# Patient Record
Sex: Female | Born: 1935
Health system: Southern US, Community
[De-identification: ages and names within clinical notes are randomized; demographics above are authoritative.]

## PROBLEM LIST (undated history)

## (undated) DIAGNOSIS — C4490 Unspecified malignant neoplasm of skin, unspecified: Secondary | ICD-10-CM

## (undated) DIAGNOSIS — K222 Esophageal obstruction: Secondary | ICD-10-CM

## (undated) DIAGNOSIS — R55 Syncope and collapse: Secondary | ICD-10-CM

## (undated) DIAGNOSIS — K219 Gastro-esophageal reflux disease without esophagitis: Secondary | ICD-10-CM

## (undated) DIAGNOSIS — R000 Tachycardia, unspecified: Secondary | ICD-10-CM

## (undated) DIAGNOSIS — R001 Bradycardia, unspecified: Secondary | ICD-10-CM

## (undated) HISTORY — DX: Bradycardia, unspecified: R00.1

## (undated) HISTORY — DX: Gastro-esophageal reflux disease without esophagitis: K21.9

## (undated) HISTORY — DX: Unspecified malignant neoplasm of skin, unspecified: C44.90

## (undated) HISTORY — DX: Syncope and collapse: R55

## (undated) HISTORY — DX: Tachycardia, unspecified: R00.0

## (undated) HISTORY — PX: TONSILLECTOMY AND ADENOIDECTOMY: SUR1326

## (undated) HISTORY — DX: Esophageal obstruction: K22.2

## (undated) HISTORY — PX: SKIN CANCER EXCISION: SHX779

---

## 2004-08-13 ENCOUNTER — Ambulatory Visit: Payer: Self-pay | Admitting: Internal Medicine

## 2004-08-15 ENCOUNTER — Ambulatory Visit: Payer: Self-pay | Admitting: Internal Medicine

## 2004-08-15 ENCOUNTER — Ambulatory Visit: Payer: Self-pay | Admitting: Family Medicine

## 2004-09-09 ENCOUNTER — Ambulatory Visit: Payer: Self-pay | Admitting: Internal Medicine

## 2004-09-17 ENCOUNTER — Ambulatory Visit: Payer: Self-pay | Admitting: Gastroenterology

## 2004-10-01 ENCOUNTER — Encounter: Admission: RE | Admit: 2004-10-01 | Discharge: 2004-10-01 | Payer: Self-pay | Admitting: Internal Medicine

## 2004-10-03 ENCOUNTER — Encounter (INDEPENDENT_AMBULATORY_CARE_PROVIDER_SITE_OTHER): Payer: Self-pay | Admitting: Specialist

## 2004-10-03 ENCOUNTER — Ambulatory Visit: Payer: Self-pay | Admitting: Gastroenterology

## 2004-10-03 ENCOUNTER — Ambulatory Visit (HOSPITAL_COMMUNITY): Admission: RE | Admit: 2004-10-03 | Discharge: 2004-10-03 | Payer: Self-pay | Admitting: Gastroenterology

## 2004-10-15 ENCOUNTER — Ambulatory Visit: Payer: Self-pay | Admitting: Internal Medicine

## 2004-10-21 ENCOUNTER — Ambulatory Visit: Payer: Self-pay

## 2006-05-12 HISTORY — PX: PACEMAKER PLACEMENT: SHX43

## 2006-06-17 ENCOUNTER — Ambulatory Visit: Payer: Self-pay | Admitting: Internal Medicine

## 2006-06-17 LAB — CONVERTED CEMR LAB
CO2: 30 meq/L (ref 19–32)
GFR calc Af Amer: 79 mL/min
GFR calc non Af Amer: 65 mL/min
Glucose, Bld: 108 mg/dL — ABNORMAL HIGH (ref 70–99)
Potassium: 4.8 meq/L (ref 3.5–5.1)
T3, Free: 3.4 pg/mL (ref 2.3–4.2)

## 2006-06-23 ENCOUNTER — Encounter: Payer: Self-pay | Admitting: Cardiology

## 2006-06-23 ENCOUNTER — Ambulatory Visit: Payer: Self-pay

## 2006-06-25 ENCOUNTER — Ambulatory Visit: Payer: Self-pay | Admitting: Cardiology

## 2006-07-01 ENCOUNTER — Ambulatory Visit: Payer: Self-pay

## 2006-07-02 ENCOUNTER — Ambulatory Visit: Payer: Self-pay | Admitting: Cardiology

## 2006-08-06 ENCOUNTER — Ambulatory Visit: Payer: Self-pay | Admitting: Internal Medicine

## 2006-08-06 LAB — CONVERTED CEMR LAB
BUN: 16 mg/dL (ref 6–23)
Basophils Absolute: 0 10*3/uL (ref 0.0–0.1)
Calcium: 9.5 mg/dL (ref 8.4–10.5)
Chloride: 103 meq/L (ref 96–112)
Creatinine, Ser: 0.8 mg/dL (ref 0.4–1.2)
Eosinophils Absolute: 0.1 10*3/uL (ref 0.0–0.6)
Hemoglobin: 16.1 g/dL — ABNORMAL HIGH (ref 12.0–15.0)
Lymphocytes Relative: 17.9 % (ref 12.0–46.0)
MCHC: 34.2 g/dL (ref 30.0–36.0)
MCV: 95.2 fL (ref 78.0–100.0)
Monocytes Absolute: 0.7 10*3/uL (ref 0.2–0.7)
Monocytes Relative: 10.4 % (ref 3.0–11.0)
Neutro Abs: 4.6 10*3/uL (ref 1.4–7.7)
Prothrombin Time: 11.8 s (ref 10.0–14.0)
RDW: 12.1 % (ref 11.5–14.6)
Sodium: 141 meq/L (ref 135–145)
WBC: 6.6 10*3/uL (ref 4.5–10.5)
aPTT: 29.6 s (ref 26.5–36.5)

## 2006-08-07 ENCOUNTER — Ambulatory Visit (HOSPITAL_COMMUNITY): Admission: RE | Admit: 2006-08-07 | Discharge: 2006-08-07 | Payer: Self-pay | Admitting: Internal Medicine

## 2006-08-07 ENCOUNTER — Ambulatory Visit: Payer: Self-pay | Admitting: Internal Medicine

## 2006-08-11 ENCOUNTER — Ambulatory Visit: Payer: Self-pay | Admitting: Internal Medicine

## 2006-08-27 ENCOUNTER — Ambulatory Visit: Payer: Self-pay

## 2006-08-31 ENCOUNTER — Ambulatory Visit: Payer: Self-pay | Admitting: Cardiology

## 2006-11-10 ENCOUNTER — Ambulatory Visit: Payer: Self-pay | Admitting: Internal Medicine

## 2006-11-10 LAB — CONVERTED CEMR LAB
BUN: 12 mg/dL (ref 6–23)
Basophils Absolute: 0 10*3/uL (ref 0.0–0.1)
Creatinine, Ser: 0.8 mg/dL (ref 0.4–1.2)
Eosinophils Absolute: 0.2 10*3/uL (ref 0.0–0.6)
Eosinophils Relative: 2.8 % (ref 0.0–5.0)
GFR calc Af Amer: 90 mL/min
GFR calc non Af Amer: 75 mL/min
HCT: 46.5 % — ABNORMAL HIGH (ref 36.0–46.0)
Hemoglobin: 15.5 g/dL — ABNORMAL HIGH (ref 12.0–15.0)
Lymphocytes Relative: 17.9 % (ref 12.0–46.0)
MCV: 98.1 fL (ref 78.0–100.0)
Monocytes Absolute: 0.6 10*3/uL (ref 0.2–0.7)
Neutrophils Relative %: 68.4 % (ref 43.0–77.0)
Potassium: 5 meq/L (ref 3.5–5.1)
Prothrombin Time: 10.6 s (ref 10.0–14.0)
Sodium: 142 meq/L (ref 135–145)
WBC: 5.7 10*3/uL (ref 4.5–10.5)

## 2006-11-12 ENCOUNTER — Ambulatory Visit (HOSPITAL_COMMUNITY): Admission: RE | Admit: 2006-11-12 | Discharge: 2006-11-14 | Payer: Self-pay | Admitting: Internal Medicine

## 2006-11-12 ENCOUNTER — Ambulatory Visit: Payer: Self-pay | Admitting: Pulmonary Disease

## 2006-11-12 ENCOUNTER — Ambulatory Visit: Payer: Self-pay | Admitting: Internal Medicine

## 2006-11-20 ENCOUNTER — Ambulatory Visit: Payer: Self-pay | Admitting: Internal Medicine

## 2006-11-30 ENCOUNTER — Ambulatory Visit: Payer: Self-pay

## 2006-12-18 ENCOUNTER — Ambulatory Visit: Payer: Self-pay | Admitting: Cardiology

## 2007-03-09 ENCOUNTER — Ambulatory Visit: Payer: Self-pay | Admitting: Internal Medicine

## 2007-10-22 IMAGING — CR DG CHEST 2V
3 series · 3 of 3 positions shown · non-contrast
Comparison: 11/14/06.

11/23/06 ? This exam was performed on 11/20/06 (though the exam date which appears on this exam order states 11/23/06)
CLINICAL DATA: 76-year-old with pacemaker placed.  Follow-up pneumothorax.
 CHEST - 2 VIEW:

[view not recorded (1 of 3)]
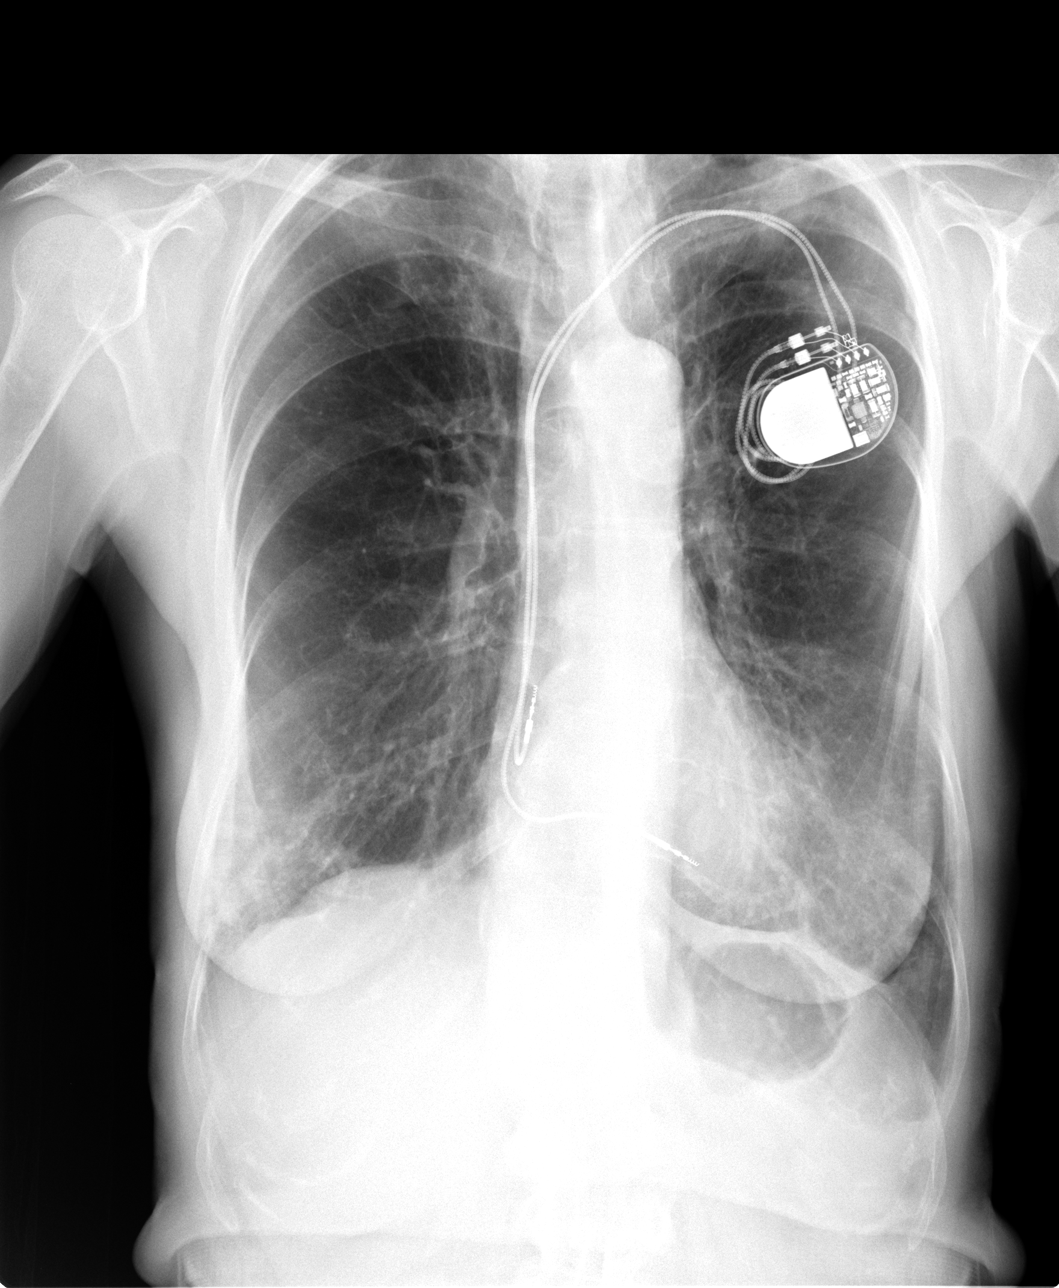

[view not recorded (2 of 3)]
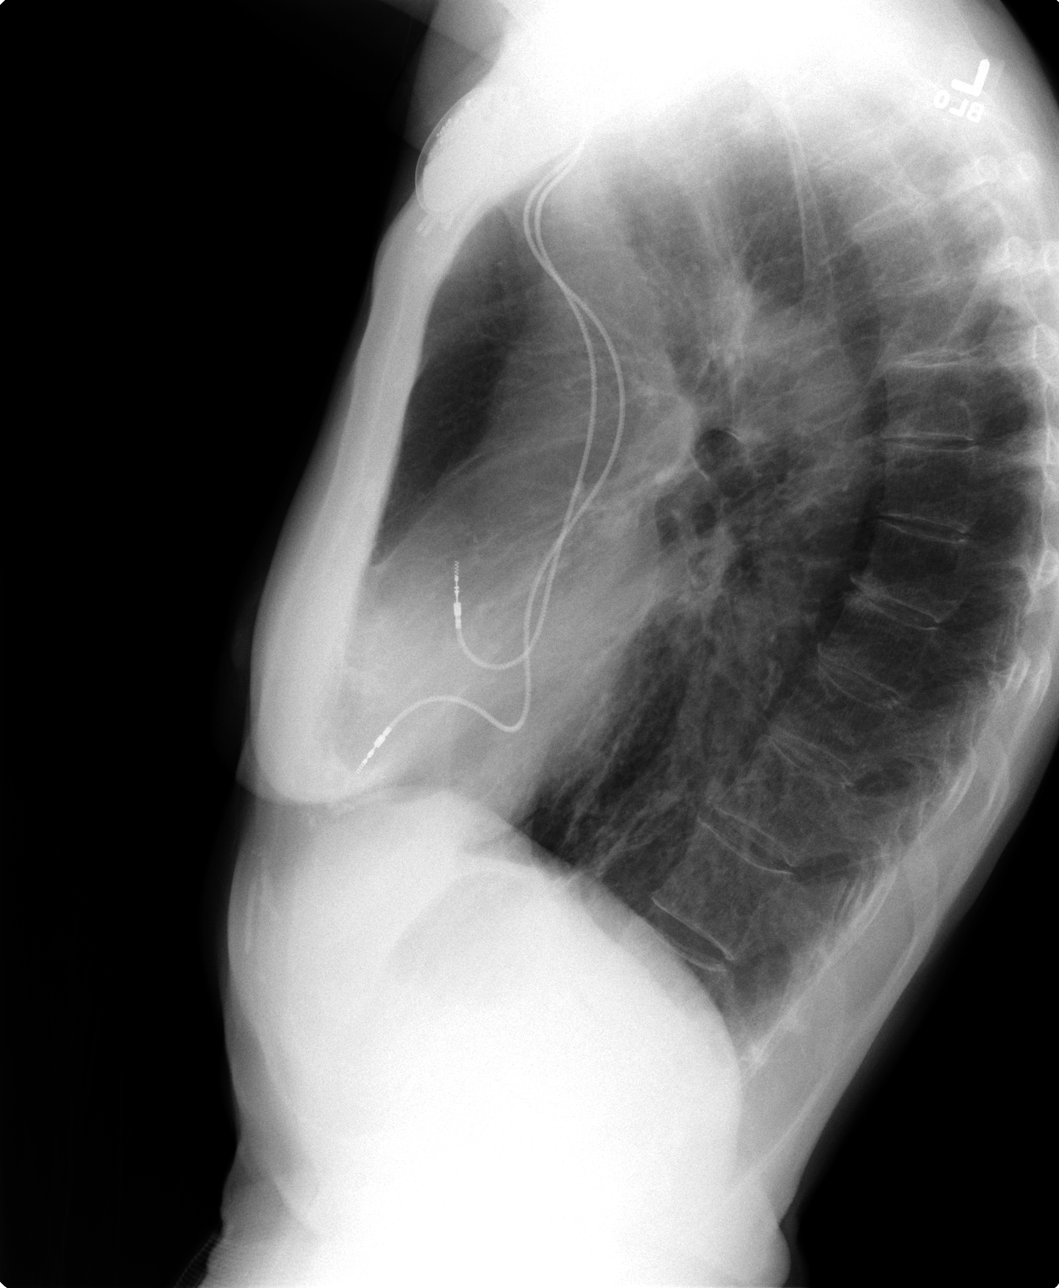

[view not recorded (3 of 3)]
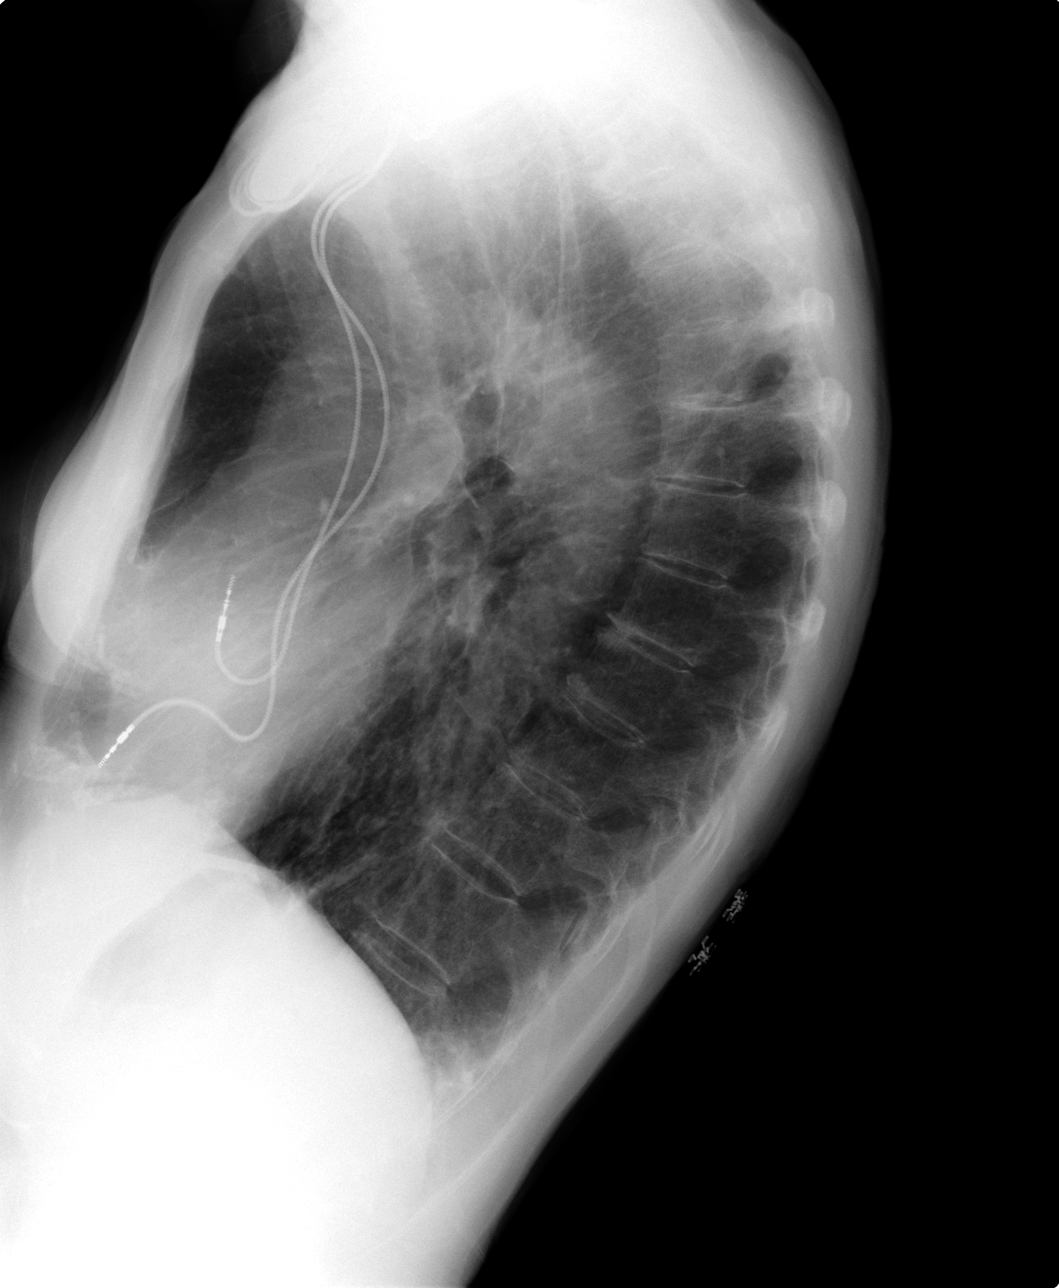

[3 of 3 positions shown; findings below may reference images not displayed]

FINDINGS: Patient has left-sided pacemaker with leads to the right atrium and right ventricle.  The lungs are hyperinflated.  Tiny left apical pneumothorax is slightly smaller.  There is patchy density at the right lung base, possibly increased since prior study.
IMPRESSION: 1.  COPD and emphysema.
 2.  Small left apical pneumothorax appears slightly smaller.
 3.  Question of increasing right lower lobe infiltrate.

## 2007-12-06 ENCOUNTER — Ambulatory Visit: Payer: Self-pay

## 2008-02-28 ENCOUNTER — Encounter: Payer: Self-pay | Admitting: Internal Medicine

## 2008-06-02 ENCOUNTER — Ambulatory Visit: Payer: Self-pay | Admitting: Internal Medicine

## 2008-06-02 DIAGNOSIS — K222 Esophageal obstruction: Secondary | ICD-10-CM | POA: Insufficient documentation

## 2008-06-02 DIAGNOSIS — I495 Sick sinus syndrome: Secondary | ICD-10-CM | POA: Insufficient documentation

## 2008-06-02 DIAGNOSIS — R55 Syncope and collapse: Secondary | ICD-10-CM | POA: Insufficient documentation

## 2008-08-30 ENCOUNTER — Ambulatory Visit: Payer: Self-pay | Admitting: Internal Medicine

## 2008-08-31 ENCOUNTER — Encounter (INDEPENDENT_AMBULATORY_CARE_PROVIDER_SITE_OTHER): Payer: Self-pay | Admitting: *Deleted

## 2008-09-07 ENCOUNTER — Encounter (INDEPENDENT_AMBULATORY_CARE_PROVIDER_SITE_OTHER): Payer: Self-pay | Admitting: *Deleted

## 2008-11-29 ENCOUNTER — Encounter: Payer: Self-pay | Admitting: Internal Medicine

## 2008-11-29 ENCOUNTER — Ambulatory Visit: Payer: Self-pay | Admitting: Internal Medicine

## 2008-12-04 ENCOUNTER — Encounter: Payer: Self-pay | Admitting: Internal Medicine

## 2009-02-28 ENCOUNTER — Ambulatory Visit: Payer: Self-pay | Admitting: Internal Medicine

## 2009-03-19 ENCOUNTER — Encounter: Payer: Self-pay | Admitting: Internal Medicine

## 2009-03-27 ENCOUNTER — Encounter (INDEPENDENT_AMBULATORY_CARE_PROVIDER_SITE_OTHER): Payer: Self-pay | Admitting: *Deleted

## 2009-04-24 ENCOUNTER — Ambulatory Visit: Payer: Self-pay | Admitting: Gastroenterology

## 2009-04-24 DIAGNOSIS — R1110 Vomiting, unspecified: Secondary | ICD-10-CM | POA: Insufficient documentation

## 2009-04-24 DIAGNOSIS — R1310 Dysphagia, unspecified: Secondary | ICD-10-CM | POA: Insufficient documentation

## 2009-04-24 DIAGNOSIS — K219 Gastro-esophageal reflux disease without esophagitis: Secondary | ICD-10-CM | POA: Insufficient documentation

## 2009-04-24 DIAGNOSIS — M810 Age-related osteoporosis without current pathological fracture: Secondary | ICD-10-CM | POA: Insufficient documentation

## 2009-04-25 ENCOUNTER — Telehealth: Payer: Self-pay | Admitting: Gastroenterology

## 2009-04-25 ENCOUNTER — Ambulatory Visit: Payer: Self-pay | Admitting: Gastroenterology

## 2009-04-25 LAB — CONVERTED CEMR LAB: UREASE: NEGATIVE

## 2009-04-30 ENCOUNTER — Encounter: Payer: Self-pay | Admitting: Gastroenterology

## 2009-06-13 ENCOUNTER — Encounter: Admission: RE | Admit: 2009-06-13 | Discharge: 2009-06-13 | Payer: Self-pay | Admitting: Internal Medicine

## 2009-06-26 ENCOUNTER — Ambulatory Visit: Payer: Self-pay | Admitting: Internal Medicine

## 2009-08-13 ENCOUNTER — Encounter (INDEPENDENT_AMBULATORY_CARE_PROVIDER_SITE_OTHER): Payer: Self-pay

## 2009-08-14 ENCOUNTER — Ambulatory Visit: Payer: Self-pay | Admitting: Gastroenterology

## 2009-09-05 ENCOUNTER — Ambulatory Visit: Payer: Self-pay | Admitting: Gastroenterology

## 2009-09-26 ENCOUNTER — Ambulatory Visit: Payer: Self-pay | Admitting: Internal Medicine

## 2009-10-11 ENCOUNTER — Encounter: Payer: Self-pay | Admitting: Internal Medicine

## 2009-12-27 ENCOUNTER — Ambulatory Visit: Payer: Self-pay | Admitting: Internal Medicine

## 2009-12-31 ENCOUNTER — Encounter: Payer: Self-pay | Admitting: Internal Medicine

## 2010-03-08 ENCOUNTER — Encounter (INDEPENDENT_AMBULATORY_CARE_PROVIDER_SITE_OTHER): Payer: Self-pay | Admitting: *Deleted

## 2010-03-28 ENCOUNTER — Ambulatory Visit: Payer: Self-pay | Admitting: Internal Medicine

## 2010-04-12 ENCOUNTER — Encounter (INDEPENDENT_AMBULATORY_CARE_PROVIDER_SITE_OTHER): Payer: Self-pay | Admitting: *Deleted

## 2010-06-02 ENCOUNTER — Encounter: Payer: Self-pay | Admitting: Internal Medicine

## 2010-06-02 ENCOUNTER — Encounter: Payer: Self-pay | Admitting: Cardiovascular Disease

## 2010-06-02 ENCOUNTER — Encounter: Payer: Self-pay | Admitting: Gastroenterology

## 2010-06-07 ENCOUNTER — Encounter: Payer: Self-pay | Admitting: Internal Medicine

## 2010-06-11 NOTE — Letter (Signed)
Summary: Remote Device Check  [facility], Main Office  [address]   [place], [place] [address]   Phone: [phone]  Fax: [phone]     October 11, 2009 MRN: [id number]   [facility] [place] [address] [place], [place]  [address]   Dear Ms. Pinnock,   Your remote transmission was recieved and reviewed by your physician.  All diagnostics were within normal limits for you.  __X___Your next transmission is scheduled for:   12-27-2009.  Please transmit at any time this day.  If you have a wireless device your transmission will be sent automatically.   Sincerely,  Vella Kohler

## 2010-06-11 NOTE — Miscellaneous (Signed)
Summary: Lec previsit  Clinical Lists Changes  Medications: Added new medication of MOVIPREP 100 GM  SOLR (PEG-KCL-NACL-NASULF-NA ASC-C) As per prep instructions. - Signed Rx of MOVIPREP 100 GM  SOLR (PEG-KCL-NACL-NASULF-NA ASC-C) As per prep instructions.;  #1 x 0;  Signed;  Entered by: Ulis Rias RN;  Authorized by: Mardella Layman MD [facility];  Method used: Electronically to [facility] [address] [place], [place], [place]  [address], Ph: [phone], Fax: [phone] Observations: Added new observation of ALLERGY REV: Done (08/14/2009 13:22)    Prescriptions: MOVIPREP 100 GM  SOLR (PEG-KCL-NACL-NASULF-NA ASC-C) As per prep instructions.  #1 x 0   Entered by:   Ulis Rias RN   Authorized by:   Mardella Layman MD [facility]   Signed by:   Ulis Rias RN on 08/14/2009   Method used:   Electronically to        [facility] (retail)       [address]       [place], [place]  [address]       Ph: [phone]       Fax: [phone]   RxID:   [id number]

## 2010-06-11 NOTE — Cardiovascular Report (Signed)
Summary: Office Visit Remote   Office Visit Remote   Imported By: Roderic Ovens 01/01/2010 15:54:07  _____________________________________________________________________  External Attachment:    Type:   Image     Comment:   External Document

## 2010-06-11 NOTE — Letter (Signed)
Summary: Remote Device Check  [facility], Main Office  [address]   [place], [place] [address]   Phone: [phone]  Fax: [phone]     December 31, 2009 MRN: [id number]   [facility] [place] [address] [place], [place]  [address]   Dear Ms. Noviello,   Your remote transmission was recieved and reviewed by your physician.  All diagnostics were within normal limits for you.  __X___Your next transmission is scheduled for:  03-28-2010.  Please transmit at any time this day.  If you have a wireless device your transmission will be sent automatically.   Sincerely,  Vella Kohler

## 2010-06-11 NOTE — Assessment & Plan Note (Signed)
Summary: 1 year rov   pacer  medtronic  Medications Added METOPROLOL SUCCINATE 25 MG XR24H-TAB (METOPROLOL SUCCINATE) Take one tablet by mouth daily      Allergies Added:   Referring Provider:  n/a Primary Provider:  Jacalyn Lefevre, MD    History of Present Illness:  Alexandria Wright is seen in followup for syncope, bradycardia, and status post pacemaker implantation.  She also had nonsustained ventricular tachycardia that turned out to be asymptomatic.  She has had no recurrent symptoms since the pacemaker was implanted in October 2008.  .   Current Medications (verified): 1)  Fosamax 70 Mg Tabs (Alendronate Sodium) .... One Tablet By Mouth Once A Week (Office Visit Needed For Refills) 2)  Metoprolol Succinate 25 Mg Xr24h-Tab (Metoprolol Succinate) .... Take One Tablet By Mouth Daily 3)  Aspirin 81 Mg  Tabs (Aspirin) .... One Tablet By Mouth As Needed For Headaches 4)  Multivitamins   Tabs (Multiple Vitamin) .... One Tablet By Mouth Once Daily  Allergies (verified): 1)  ! Penicillin 2)  ! Sulfa  Vital Signs:  Patient profile:   77 year old female Weight:      116 pounds Pulse rate:   77 / minute Pulse rhythm:   regular BP sitting:   120 / 60  (right arm) Cuff size:   regular  Vitals Entered By: Deliah Goody, RN (June 26, 2009 11:14 AM)  Physical Exam  General:  The patient was alert and oriented in no acute distress. HEENT Normal.  Neck veins were flat, carotids were brisk.  Lungs were clear.  Heart sounds were regular without murmurs or gallops.  Abdomen was soft with active bowel sounds. There is no clubbing cyanosis or edema. Skin Warm and dry    EKG  Procedure date:  06/26/2009  Findings:      sinus rhtyhm at 66 .21/.08/.40 Cheryln Manly 82  o.w normal  PPM Specifications Following MD:  Sherryl Manges, MD     PPM Vendor:  Medtronic     PPM Model Number:  ADDR01     PPM Serial Number:  [id number] PPM DOI:  11/12/2006      Lead 1    Location: RA     DOI:  11/12/2006     Model #: 7616     Serial #: [id number]     Status: active Lead 2    Location: RV     DOI: 11/17/2006     Model #: [id number]     Serial #: [id number]     Status: active   Indications:  SYNCOPE; SINUS NODE ARREST      PPM Follow Up Remote Check?  No Battery Voltage:  2.78 V     Battery Est. Longevity:  7.5 YEARS     Pacer Dependent:  No       PPM Device Measurements Atrium  Amplitude: 2.8 mV, Impedance: 628 ohms, Threshold: 0.875 V at 0.4 msec Right Ventricle  Amplitude: 22.4 mV, Impedance: 730 ohms, Threshold: 0.625 V at 0.4 msec  Episodes MS Episodes:  2     Percent Mode Switch:  <1%     Ventricular High Rate:  1     Atrial Pacing:  22.1%     Ventricular Pacing:  0.4%  Parameters Mode:  MVP (R)     Lower Rate Limit:  60     Upper Rate Limit:  130 Paced AV Delay:  180     Sensed AV Delay:  150 Next Remote Date:  09/26/2009  Next Cardiology Appt Due:  06/12/2010 Tech Comments:  Normal device function.  No changes made today.  Both mode switche episodes less than 1 minute.  VHR episode is NSVT, seen on previous Carelink transmission.  Pt does Carelinks, ROV 12 months SK. Gypsy Balsam RN BSN  June 26, 2009 11:19 AM   Impression & Recommendations:  Problem # 1:  SYNCOPE AND COLLAPSE (ICD-780.2) no recurrent syncope  Problem # 2:  PACEMAKER (ICD-V45.Marland Kitchen01) Device parameters and data were reviewed and no changes were made  Problem # 3:  VENTRICULAR TACHYCARDIA-NONSUSTAINED (ICD-427.1) no nonsustained VT detect by her device. Continue her on Lopressor  Patient Instructions: 1)  You are scheduled for a device check from home on  Sep 26, 2009. You may send your transmission at any time that day. If you have a wireless device, the transmission will be sent automatically. After your physician reviews your transmission, you will receive a postcard with your next transmission date. 2)  Your physician wants you to follow-up in:  12 months with Dr Graciela Husbands. You will receive a  reminder letter in the mail two months in advance. If you don't receive a letter, please call our office to schedule the follow-up appointment.

## 2010-06-11 NOTE — Miscellaneous (Signed)
Summary: dx correction  Clinical Lists Changes  Problems: Changed problem from PACEMAKER (ICD-V45..01) to PACEMAKER, PERMANENT (ICD-V45.01)  changed the incorrect dx code to correct dx code 

## 2010-06-11 NOTE — Letter (Signed)
Summary: Remote Device Check  [facility], Main Office  [address]   [place], [place] [address]   Phone: [phone]  Fax: [phone]     April 12, 2010 MRN: [id number]   [facility] [place] [address] [place], [place]  [address]   Dear Ms. Ricke,   Your remote transmission was recieved and reviewed by your physician.  All diagnostics were within normal limits for you.  _____Your next transmission is scheduled for:                       .  Please transmit at any time this day.  If you have a wireless device your transmission will be sent automatically.  ___X___Your next office visit is scheduled for:  February 21,2012 @ 9:40am with Dr. Graciela Husbands.                                Sincerely,  Altha Harm, LPN

## 2010-06-11 NOTE — Cardiovascular Report (Signed)
Summary: Office Visit Remote   Office Visit Remote   Imported By: Roderic Ovens 10/12/2009 10:55:17  _____________________________________________________________________  External Attachment:    Type:   Image     Comment:   External Document

## 2010-06-11 NOTE — Cardiovascular Report (Signed)
Summary: Office Visit Remote   Office Visit Remote   Imported By: Roderic Ovens 04/12/2010 15:06:58  _____________________________________________________________________  External Attachment:    Type:   Image     Comment:   External Document

## 2010-06-11 NOTE — Procedures (Signed)
Summary: Colonoscopy  Patient: [name] Note: All result statuses are Final unless otherwise noted.  Tests: (1) Colonoscopy (COL)   COL Colonoscopy           DONE     [facility]     [address]     [place], [place]  [address]           COLONOSCOPY PROCEDURE REPORT           PATIENT:  Alexandria Wright, Alexandria Wright  MR#:  [id number]     BIRTHDATE:  [birth year], 79 yrs. old  GENDER:  female     ENDOSCOPIST:  Vania Rea. Jarold Motto, MD, [facility]     REF. BY:     PROCEDURE DATE:  09/05/2009     PROCEDURE:  Average-risk screening colonoscopy     G0121     ASA CLASS:  Class II     INDICATIONS:  Routine Risk Screening     MEDICATIONS:   Fentanyl 25 mcg IV, Versed 5 mg IV           DESCRIPTION OF PROCEDURE:   After the risks benefits and     alternatives of the procedure were thoroughly explained, informed     consent was obtained.  Digital rectal exam was performed and     revealed no abnormalities.   The LB [id number]-[id number] endoscope     was introduced through the anus and advanced to the cecum, which     was identified by both the appendix and ileocecal valve, without     limitations.  The quality of the prep was excellent, using     MoviPrep.  The instrument was then slowly withdrawn as the colon     was fully examined.     <<PROCEDUREIMAGES>>           FINDINGS:  Moderate diverticulosis was found in the sigmoid to     descending colon segments.  No polyps or cancers were seen.  This     was otherwise a normal examination of the colon.   Retroflexed     views in the rectum revealed no abnormalities.    The scope was     then withdrawn from the patient and the procedure completed.           COMPLICATIONS:  None     ENDOSCOPIC IMPRESSION:     1) Moderate diverticulosis in the sigmoid to descending colon     segments     2) No polyps or cancers     3) Otherwise normal examination     RECOMMENDATIONS:     1) high fiber diet     no f/u needed per age.     REPEAT EXAM:  No       ______________________________     Vania Rea. Jarold Motto, MD, [facility] Graham           CC:  Jacalyn Lefevre, MD           n.     Rosalie Doctor:   Vania Rea. Delta Pichon at 09/05/2009 08:58 AM           Drinda Butts, [id number]  Note: An exclamation mark (!) indicates a result that was not dispersed into the flowsheet. Document Creation Date: 09/05/2009 8:59 AM _______________________________________________________________________  (1) Order result status: Final Collection or observation date-time: 09/05/2009 08:53 Requested date-time:  Receipt date-time:  Reported date-time:  Referring Physician:   Ordering Physician: Sheryn Bison [phone]) Specimen Source:  Source: Launa Grill Order Number:  [id number] Lab site:

## 2010-06-11 NOTE — Letter (Signed)
Summary: [facility] Instructions  [facility]  [address] [place], [place] [address]   Phone: [phone]  Fax: [phone]       Alexandria Wright    06/15/1929    MRN: [id number]        Procedure Day /Date:  09/05/09  Wednesday     Arrival Time:  7:30am      Procedure Time:  8:30am     Location of Procedure:                    _x _   [facility] (4th Floor)                        PREPARATION FOR COLONOSCOPY WITH MOVIPREP   Starting 5 days prior to your procedure _4/22/11 _ do not eat nuts, seeds, popcorn, corn, beans, peas,  salads, or any raw vegetables.  Do not take any fiber supplements (e.g. Metamucil, Citrucel, and Benefiber).  THE DAY BEFORE YOUR PROCEDURE         DATE:  09/04/09  DAY:   Tuesday  1.  Drink clear liquids the entire day-NO SOLID FOOD  2.  Do not drink anything colored red or purple.  Avoid juices with pulp.  No orange juice.  3.  Drink at least 64 oz. (8 glasses) of fluid/clear liquids during the day to prevent dehydration and help the prep work efficiently.  CLEAR LIQUIDS INCLUDE: Water Jello Ice Popsicles Tea (sugar ok, no milk/cream) Powdered fruit flavored drinks Coffee (sugar ok, no milk/cream) Gatorade Juice: apple, white grape, white cranberry  Lemonade Clear bullion, consomm, broth Carbonated beverages (any kind) Strained chicken noodle soup Hard Candy                             4.  In the morning, mix first dose of MoviPrep solution:    Empty 1 Pouch A and 1 Pouch B into the disposable container    Add lukewarm drinking water to the top line of the container. Mix to dissolve    Refrigerate (mixed solution should be used within 24 hrs)  5.  Begin drinking the prep at 5:00 p.m. The MoviPrep container is divided by 4 marks.   Every 15 minutes drink the solution down to the next mark (approximately 8 oz) until the full liter is complete.   6.  Follow completed prep with 16 oz of clear liquid of your  choice (Nothing red or purple).  Continue to drink clear liquids until bedtime.  7.  Before going to bed, mix second dose of MoviPrep solution:    Empty 1 Pouch A and 1 Pouch B into the disposable container    Add lukewarm drinking water to the top line of the container. Mix to dissolve    Refrigerate  THE DAY OF YOUR PROCEDURE      DATE:  09/05/09  DAY:   Wednesday  Beginning at 3:30 a.m. (5 hours before procedure):         1. Every 15 minutes, drink the solution down to the next mark (approx 8 oz) until the full liter is complete.  2. Follow completed prep with 16 oz. of clear liquid of your choice.    3. You may drink clear liquids until _  _ (2 HOURS BEFORE PROCEDURE).   MEDICATION INSTRUCTIONS  Unless otherwise instructed, you should take regular prescription medications with a small  sip of water   as early as possible the morning of your procedure.         OTHER INSTRUCTIONS  You will need a responsible adult at least 75 years of age to accompany you and drive you home.   This person must remain in the waiting room during your procedure.  Wear loose fitting clothing that is easily removed.  Leave jewelry and other valuables at home.  However, you may wish to bring a book to read or  an iPod/MP3 player to listen to music as you wait for your procedure to start.  Remove all body piercing jewelry and leave at home.  Total time from sign-in until discharge is approximately 2-3 hours.  You should go home directly after your procedure and rest.  You can resume normal activities the  day after your procedure.  The day of your procedure you should not:   Drive   Make legal decisions   Operate machinery   Drink alcohol   Return to work  You will receive specific instructions about eating, activities and medications before you leave.    The above instructions have been reviewed and explained to me by   Ulis Rias RN  August 14, 2009 1:56 PM     I fully  understand and can verbalize these instructions _____________________________ Date _________

## 2010-06-13 NOTE — Miscellaneous (Signed)
Summary: corrected devfice information  Clinical Lists Changes  Observations: Added new observation of PPM SERL#: [id number] (06/07/2010 20:23)      PPM Specifications Following MD:  Sherryl Manges, MD     PPM Vendor:  Medtronic     PPM Model Number:  ADDR01     PPM Serial Number:  [id number] PPM DOI:  11/12/2006      Lead 1    Location: RA     DOI: 11/12/2006     Model #: 7829     Serial #: [id number]     Status: active Lead 2    Location: RV     DOI: 11/17/2006     Model #: 8469     Serial #: [id number]     Status: active   Indications:  SYNCOPE; SINUS NODE ARREST      PPM Follow Up Pacer Dependent:  No      Parameters Mode:  MVP (R)     Lower Rate Limit:  60     Upper Rate Limit:  130 Paced AV Delay:  180     Sensed AV Delay:  150

## 2010-07-02 ENCOUNTER — Encounter (INDEPENDENT_AMBULATORY_CARE_PROVIDER_SITE_OTHER): Payer: Medicare Other | Admitting: Internal Medicine

## 2010-07-02 ENCOUNTER — Encounter: Payer: Self-pay | Admitting: Internal Medicine

## 2010-07-02 DIAGNOSIS — I495 Sick sinus syndrome: Secondary | ICD-10-CM

## 2010-07-02 DIAGNOSIS — R55 Syncope and collapse: Secondary | ICD-10-CM

## 2010-07-02 DIAGNOSIS — Z950 Presence of cardiac pacemaker: Secondary | ICD-10-CM

## 2010-07-09 NOTE — Miscellaneous (Signed)
  Clinical Lists Changes  Problems: Changed problem from PACEMAKER, PERMANENT (ICD-V45.01) to PACEMAKER, PERMANENT-PPM MEDTRONIC (ICD-V45.01) - Sinoatrial node dysfunction Observations: Added new observation of PAST MED HX: syncope Asymptomatic nonsustained ventricular tachycardia symptomatic bradycardia Status post pacemaker-PPM Medtronic GE reflux disease Esophageal stricture with dilatation Skin cancer  (07/02/2010 9:22)        Allergies: 1)  ! Penicillin 2)  ! Sulfa   Past History:  Past Medical History: syncope Asymptomatic nonsustained ventricular tachycardia symptomatic bradycardia Status post pacemaker-PPM Medtronic GE reflux disease Esophageal stricture with dilatation Skin cancer

## 2010-07-09 NOTE — Assessment & Plan Note (Signed)
Summary: pacer ck/medtronic/amber/kl      Allergies Added:   Referring Provider:  n/a Primary Provider:  Jacalyn Lefevre, MD    History of Present Illness:  Alexandria Wright is seen in followup for syncope, bradycardia, and status post pacemaker implantation.  She also had nonsustained ventricular tachycardia that turned out to be asymptomatic.  She has had no recurrent symptoms since the pacemaker was implanted in October 2008. she continues to walk 3 miles a day  Current Medications (verified): 1)  Fosamax 70 Mg Tabs (Alendronate Sodium) .... One Tablet By Mouth Once A Week (Office Visit Needed For Refills) 2)  Metoprolol Succinate 25 Mg Xr24h-Tab (Metoprolol Succinate) .... Take One Tablet By Mouth Daily 3)  Aspirin 81 Mg  Tabs (Aspirin) .... One Tablet By Mouth As Needed For Headaches 4)  Multivitamins   Tabs (Multiple Vitamin) .... One Tablet By Mouth Once Daily  Allergies (verified): 1)  ! Penicillin 2)  ! Sulfa  Past History:  Past Medical History: Last updated: 07/02/2010 syncope Asymptomatic nonsustained ventricular tachycardia symptomatic bradycardia Status post pacemaker-PPM Medtronic GE reflux disease Esophageal stricture with dilatation Skin cancer  Past Surgical History: Last updated: 06/02/2008 Medtronic pacemaker insertion-July 2008 tonsillectomy and adenoidectomy  Family History: Last updated: 04/24/2009 Negative FH of Diabetes, Hypertension, or Coronary Artery Disease Family History of Heart Disease: Mother  No FH of Colon Cancer:  Social History: Last updated: 06/02/2008 widowed No children-caring for grandnieces Tobacco Use - No.  Alcohol Use - no  Vital Signs:  Patient profile:   77 year old female Height:      66 inches Weight:      123.8 pounds BMI:     20.05 Pulse rate:   75 / minute Pulse rhythm:   regular BP sitting:   126 / 72  (right arm) Cuff size:   regular  Vitals Entered By: Judithe Modest CMA (July 02, 2010 9:45  AM)  Physical Exam  General:  The patient was alert and oriented in no acute distress. HEENT Normal.  Neck veins were flat, carotids were brisk.  Lungs were clear.  Heart sounds were regular without murmurs or gallops.  Abdomen was soft with active bowel sounds. There is no clubbing cyanosis or edema. Skin Warm and dry    PPM Specifications Following MD:  Sherryl Manges, MD     PPM Vendor:  Medtronic     PPM Model Number:  ADDR01     PPM Serial Number:  [id number] PPM DOI:  11/12/2006      Lead 1    Location: RA     DOI: 11/12/2006     Model #: 5076     Serial #: [id number]     Status: active Lead 2    Location: RV     DOI: 11/17/2006     Model #: 9528     Serial #: [id number]     Status: active   Indications:  SYNCOPE; SINUS NODE ARREST      PPM Follow Up Pacer Dependent:  No      Parameters Mode:  MVP (R)     Lower Rate Limit:  60     Upper Rate Limit:  130 Paced AV Delay:  180     Sensed AV Delay:  150  Impression & Recommendations:  Problem # 1:  VENTRICULAR TACHYCARDIA-NONSUSTAINED (ICD-427.1)  Her updated medication list for this problem includes:    Metoprolol Succinate 25 Mg Xr24h-tab (Metoprolol succinate) .Marland Kitchen... Take one tablet by mouth daily  Aspirin 81 Mg Tabs (Aspirin) ..... One tablet by mouth as needed for headaches  Her updated medication list for this problem includes:    Metoprolol Succinate 25 Mg Xr24h-tab (Metoprolol succinate) .Marland Kitchen... Take one tablet by mouth daily    Aspirin 81 Mg Tabs (Aspirin) ..... One tablet by mouth as needed for headaches  Orders: EKG w/ Interpretation (93000)  Problem # 2:  PACEMAKER, PERMANENT-PPM MEDTRONIC (ICD-V45.01) Device parameters and data were reviewed and no changes were made  Problem # 3:  SYNCOPE AND COLLAPSE (ICD-780.2)  nrecurrent syncope Her updated medication list for this problem includes:    Metoprolol Succinate 25 Mg Xr24h-tab (Metoprolol succinate) .Marland Kitchen... Take one tablet by mouth daily    Aspirin 81 Mg  Tabs (Aspirin) ..... One tablet by mouth as needed for headaches  Her updated medication list for this problem includes:    Metoprolol Succinate 25 Mg Xr24h-tab (Metoprolol succinate) .Marland Kitchen... Take one tablet by mouth daily    Aspirin 81 Mg Tabs (Aspirin) ..... One tablet by mouth as needed for headaches  Patient Instructions: 1)  Your physician recommends that you continue on your current medications as directed. Please refer to the Current Medication list given to you today. 2)  Your physician wants you to follow-up in:  YEAR WITH DR Logan Bores will receive a reminder letter in the mail two months in advance. If you don't receive a letter, please call our office to schedule the follow-up appointment.

## 2010-07-18 NOTE — Cardiovascular Report (Signed)
Summary: Office Visit   Office Visit   Imported By: Roderic Ovens 07/11/2010 16:08:37  _____________________________________________________________________  External Attachment:    Type:   Image     Comment:   External Document

## 2010-07-29 ENCOUNTER — Telehealth: Payer: Self-pay | Admitting: Internal Medicine

## 2010-07-30 ENCOUNTER — Ambulatory Visit (INDEPENDENT_AMBULATORY_CARE_PROVIDER_SITE_OTHER): Payer: Medicare Other | Admitting: *Deleted

## 2010-07-30 DIAGNOSIS — E7800 Pure hypercholesterolemia, unspecified: Secondary | ICD-10-CM

## 2010-07-31 ENCOUNTER — Telehealth: Payer: Self-pay | Admitting: *Deleted

## 2010-07-31 LAB — HEPATIC FUNCTION PANEL
ALT: 11 U/L (ref 0–35)
Albumin: 4.4 g/dL (ref 3.5–5.2)
Total Bilirubin: 0.7 mg/dL (ref 0.3–1.2)
Total Protein: 6.9 g/dL (ref 6.0–8.3)

## 2010-07-31 LAB — LIPID PANEL
Cholesterol: 217 mg/dL — ABNORMAL HIGH (ref 0–200)
Total CHOL/HDL Ratio: 4
Triglycerides: 119 mg/dL (ref 0.0–149.0)

## 2010-08-01 NOTE — Telephone Encounter (Signed)
SEE LAB NOTE. 

## 2010-08-08 NOTE — Progress Notes (Signed)
Summary: question re meds and blood work   Phone Note Call from Patient Call back at [employer] [phone]   Caller: Patient Reason for Call: Talk to Nurse Summary of Call: Pt states her primary doctor states she should take meds for  her cholesterol  pt want to come in and get blood work done to make sure she needs the meds.   Initial call taken by: Roe Coombs,  July 29, 2010 9:11 AM  Follow-up for Phone Call        Phone Call Completed PT TO COME IN AM FOR A  FASTING LIPID LIVER.  IS NOT ON CHOLESTEROL MEDS WANTS TO MAKE SURE NEEDS PRIOR TO STARTING .PMD WANTING TO INITIATE THERAPY. Follow-up by: Scherrie Bateman, LPN,  July 29, 2010 10:03 AM

## 2010-09-24 NOTE — Assessment & Plan Note (Signed)
[facility]                            CARDIOLOGY OFFICE NOTE   Alexandria, Wright                    MRN:          [id number]  DATE:12/18/2006                            DOB:          04/04/1930    Alexandria Wright is a very pleasant female whom I have seen in the past for  syncopal episodes.  A previous Myoview in February showed normal  perfusion and normal LV function.  An echocardiogram showed normal LV  function and mild mitral regurgitation.  She did have nonsustained  ventricular tachycardia, and we asked her to see Dr. Graciela Husbands after placing  her on Toprol.  He placed a loop and she was found to have symptomatic  bradycardia.  She ultimately underwent a pacemaker implantation on November 12, 2006.  The procedure was complicated by a pneumothorax.  A chest tube  was placed and this did resolve.  Since then she has done well.  She has  mild dyspnea on exertion, but there is no orthopnea, PND, pedal edema,  palpitations, presyncope, syncope or exertional chest pain.  She has had  no fevers.   MEDICATIONS:  1. Os-cal.  2. Multivitamin.  3. Prilosec.  4. Toprol 25 mg p.o. daily.  5. Fosamax.   PHYSICAL EXAMINATION:  Shows a blood pressure of 128/75 and her pulse is  100.  Her HEENT is normal.  Her neck is supple.  Her chest is clear.  CARDIOVASCULAR EXAM:  Reveals a regular rate and rhythm.  Her pacemaker  site is without evidence of hematoma or infection.  ABDOMINAL EXAM:  Benign.  EXTREMITIES:  No edema.   Electrocardiogram shows a sinus rhythm at a rate of 101.  There are  nonspecific ST changes.   DIAGNOSES:  1. History of syncope, now felt secondary to bradycardia:  The patient      is now status post pacemaker placement.  She will follow up with      Dr. Graciela Husbands for this issue.  2. History of nonsustained ventricular tachycardia on CardioNet      monitor:  She will continue on her present dose of Toprol.  3. History of esophageal  stricture, status post dilatation.   Alexandria Wright will follow up with Dr. Graciela Husbands concerning her pacemaker and  she has no other cardiac issues.  We will, therefore, see her back on an  as-needed basis.     Madolyn Frieze Jens Som, MD, [facility]  Electronically Signed    BSC/MedQ  DD: 12/18/2006  DT: 12/18/2006  Job #: [id number]

## 2010-09-24 NOTE — Assessment & Plan Note (Signed)
[facility]                         ELECTROPHYSIOLOGY OFFICE NOTE   ADAEZE, BETTER                    MRN:          [id number]  DATE:11/10/2006                            DOB:          [birth year]    Ms. Norkus comes in.  She is status post loop recorder implantation  for syncope.  Interrogation of her loop today demonstrates a significant  episode of bradycardia with heart rates in the teens and pauses of 3-4  seconds at a time.  This occurred about 2 months ago.  It does not at  all appear to be artifact.   This has turned out the patient had no symptoms, but it was early in the  morning.   MEDICATIONS:  Toprol, Prilosec, and Fosamax.   PHYSICAL EXAMINATION:  VITAL SIGNS:  Her blood pressure was 143/82 with  a pulse of 68.  LUNGS:  Clear.  HEART SOUNDS:  Regular.  EXTREMITIES:  Without edema.   Interrogation of her loop recorder is as noted previously.   Ms. Moro needs to have her loop recorder removed and a pacemaker  implanted.  I have reviewed this with her.  She is agreeable and willing  to proceed.  We have reviewed the potential benefits as well as the  potential risks including but not limited to death, perforation,  infection, lead dislodgement.   We will plan to explant the loop recorder at the same time.     Duke Salvia, MD, [facility]  Electronically Signed    SCK/MedQ  DD: 11/10/2006  DT: 11/10/2006  Job #: [id number]

## 2010-09-24 NOTE — Assessment & Plan Note (Signed)
[facility]                         ELECTROPHYSIOLOGY OFFICE NOTE   Alexandria Wright, Alexandria Wright                    MRN:          [id number]  DATE:06/02/2008                            DOB:          Feb 01, 1930    Alexandria Wright is seen in followup for syncope, bradycardia, and status  post pacemaker implantation.  She also had nonsustained ventricular  tachycardia that turned out to be asymptomatic.  She has had no  recurrent symptoms since the pacemaker was implanted in October 2008.  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   There was no complaints of chest pain, shortness of breath, or  peripheral edema.   MEDICATIONS:  Os-Cal, multivitamins, Prilosec, Toprol, and Fosamax.   PHYSICAL EXAMINATION:  VITAL SIGNS:  Her blood pressure is 110/80, her  pulse is 84, and her weight was 112 pounds, which is stable.  LUNGS:  Clear.  HEART:  Sounds regular.  Pacemaker problem has well healed.  EXTREMITIES:  Without edema.   Interrogation of Medtronic Adapta pulse generator demonstrates the P-  wave of 2.8 with impedance of 650 and threshold of 0.875 at 0.4.  The R-  wave of 16 with pace impedance of 643 and threshold of 0.5 at 0.4.   IMPRESSION:  1. Sinus node dysfunction.  2. Syncope.  3. Status post pacer for the above.   Alexandria Wright is stable.  I will plan to see her again in 1 year's time.     Duke Salvia, MD, [facility]  Electronically Signed    SCK/MedQ  DD: 06/02/2008  DT: 06/02/2008  Job #: [id number]

## 2010-09-24 NOTE — Op Note (Signed)
NAME:  Alexandria Wright, Alexandria Wright             ACCOUNT NO.:  [id number]   MEDICAL RECORD NO.:  [id number]          PATIENT TYPE:  OIB   LOCATION:  3704                         FACILITY:  [facility]   PHYSICIAN:  Duke Salvia, MD, FACCDATE OF BIRTH:  03-31-1930   DATE OF PROCEDURE:  11/12/2006  DATE OF DISCHARGE:                               OPERATIVE REPORT   PREOPERATIVE DIAGNOSIS:  Syncope with nonsustained ventricular  tachycardia, loop recorder implantation with documented sinus node  arrest.   POSTOPERATIVE DIAGNOSIS:  Syncope with nonsustained ventricular  tachycardia, loop recorder implantation with documented sinus node  arrest.   PROCEDURE:  Explantation of a previously implanted loop recorder,  insertion of a dual-chamber pacemaker.   Following obtaining informed consent, the patient was brought to the  electrophysiology laboratory and placed on the fluoroscopic table in  supine position.  After routine prep and drape, the left upper chest,  lidocaine was infiltrated prepectoral subclavicular region.  Incision  was made and carried down to layer of the prepectoral fascia using  electrocautery and sharp dissection.  A pocket was formed similarly.  Hemostasis was obtained.  This point we actually excavated from caudally  to the obtain the cephalad portion of the previously implanted loop  recorder pocket.  The pocket was opened.  The loop recorder was removed  and the retaining sutures were removed.  At that point attention was  turned to gaining access to the extrathoracic left subclavian vein which  was accomplished by the aspiration of air on two occasions.  Single  venipuncture was accomplished and a double wire technique was used.  Sequentially 7-French sheaths were placed and through these were passed  Medtronic 5076 58-cm active fixation ventricular lead, serial number  [id number] and a Medtronic 5076 52-cm active fixation atrial lead  serial number [id number].  Ventricular  lead was marked with a tie.   Under fluoroscopic guidance, the right ventricular lead was manipulated  to the right apex where the bipolar R wave was 9.4 the pace impedance of  0.9 volts of 0.5 milliseconds.  Current of 5 volts was 1.3 MA, the  impedance was 981 ohms.  There was no diaphragmatic pacing at 10 volts  and the current of injury was brisk.   Bipolar P-wave recorded in the right atrial appendage which was 5.7 mV  with a pace impedance of 1.5 volts at 0.5 milliseconds.  Current at 5  volts was 2.1 MA, impedance of 983 ohms.  The current of injury was  brisk and there is no diaphragmatic pacing at 10 volts.  The leads were  then attached to a Medtronic Adaptic ADDR01 pulse generator, serial  number [id number].  P synchronous pacing was identified.  The pocket was  copiously irrigated with antibiotic containing saline solution.  Hemostasis was obtained and assured.  The leads and pulse generator were  placed in the pocket secured to the prepectoral fascia.  The wound was  then closed in three layers in normal  fashion.  The wound was washed, dried and a benzoin Steri-Strip dressing  was applied.  Needle count, sponge counts and instrument counts were  correct at the end of the procedure according to staff.  The patient  tolerated the procedure without apparent complication.      Duke Salvia, MD, [facility]  Electronically Signed     SCK/MEDQ  D:  11/12/2006  T:  11/13/2006  Job:  [id number]

## 2010-09-24 NOTE — Assessment & Plan Note (Signed)
[facility]                         ELECTROPHYSIOLOGY OFFICE NOTE   Alexandria, Wright                    MRN:          [id number]  DATE:03/09/2007                            DOB:          23-Jun-1929    HISTORY:  Alexandria Wright comes in feeling terrific.  Her energy level is  restored.  She has had no more lightheadedness.   PHYSICAL EXAMINATION:  VITAL SIGNS:  Blood pressure 146/75, pulse 80.  LUNGS:  Clear.  HEART:  Sounds regular.  EXTREMITIES:  Without edema.  Her device pocket was well-healed.   Interrogation of her Medtronic pace generator demonstrated a P-wave of 1  to 2 with impedance of 662,  a threshold of 1 volt at 0.4, the R-wave 16  with an impedance of 656, threshold of 0.625 at 0.4, battery voltage of  2.78.  She is ventricularly paced about 3% of the time, atrial paced 15%  of the time.   IMPRESSION:  1. Syncope with documented bradycardia.  2. Status post pacemaker implantation for the above.   FOLLOWUP:  1. Alexandria Wright is doing terrific.  We will see her again in seven      months' time.  2. She will follow up with Dr. Madolyn Frieze. Crenshaw in the interim.     Duke Salvia, MD, [facility]  Electronically Signed    SCK/MedQ  DD: 03/09/2007  DT: 03/09/2007  Job #: [id number]   cc:   Barbette Hair. Artist Pais, DO

## 2010-09-24 NOTE — Assessment & Plan Note (Signed)
[facility]                         ELECTROPHYSIOLOGY OFFICE NOTE   NIKITA, SURMAN                    MRN:          [id number]  DATE:11/30/2006                            DOB:          07/10/1929    Ms. Cerino was seen today in the clinic on November 30, 2006, for a wound  check of her newly implanted Medtronic model #ADDR01 Adapta.  Date of  implant was November 12, 2006, for syncope and sinus node arrest.   On interrogation of her device today, her battery voltage is 2.78 with P-  waves measuring 1.4 to 2.8 mV and atrial capture threshold of 0.75 volts  at 0.4 msec and an atrial lead impedance of 663 ohms.  R-waves measured  16 to 22.40 mV with a ventricular pacing threshold of 1 volt at 0.4 msec  and a ventricular lead impedance of 623 ohms.  Underlying rhythm today  was a sinus rhythm at 82.  There was one high ventricular rate noted.  Her total ventricular pacing was 0.8%.  Captured adaptive is  programmed  on in both the A and D.  Steri-Strips are removed from all sites.  This  lady had a loop monitor removed at the time of pacer implants and both  sites looked good without any redness or edema.  She has a return office  visit scheduled for October with Dr. Graciela Husbands.      Altha Harm, LPN  Electronically Signed      Duke Salvia, MD, [facility]  Electronically Signed   PO/MedQ  DD: 11/30/2006  DT: 11/30/2006  Job #: [id number]
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
## 2010-09-24 NOTE — Discharge Summary (Signed)
NAME:  Alexandria Wright, Alexandria Wright             ACCOUNT NO.:  [id number]   MEDICAL RECORD NO.:  [id number]          PATIENT TYPE:  OIB   LOCATION:  3704                         FACILITY:  [facility]   PHYSICIAN:  Duke Salvia, MD, FACCDATE OF BIRTH:  07-27-1929   DATE OF ADMISSION:  11/12/2006  DATE OF DISCHARGE:  11/14/2006                               DISCHARGE SUMMARY   DISCHARGE DIAGNOSIS:  1. Sinus node dysfunction.  This sinus bradycardia and pauses in the      setting of syncope status post permanent pacemaker implantation      this admission with a dual-chamber Medtronic device by Dr. Berton Mount on November 12, 2006.  2. A 20% left pneumothorax status post permanent pacemaker insertion      insertion of a chest tube on November 12, 2006.  Chest tube removed on      November 14, 2006.  Post chest tube removal chest x-ray showing stable      tiny left apical pneumothorax with mild right basilar atelectasis.   PAST MEDICAL HISTORY:  1. Osteoporosis.  2. Syncopal episode.  3. Esophageal stricture status post dilatation.   PROCEDURES THIS ADMISSION:  Include implantation of a dual-chamber  pacemaker on November 12, 2006, and insertion of a chest tube status post  left pneumothorax on November 12, 2006.   HOSPITAL COURSE:  Alexandria Wright is a very pleasant 77 year old Caucasian  female with no known coronary artery disease who underwent implantation  of a loop recorder for syncopal episodes.  The patient evaluated by Dr.  Berton Mount. Loop recorder irrigation showed significant episodes of  bradycardia with heart rate in the teens and pauses of 3-4 seconds at  time with recommendation for implantation of a permanent pacemaker. The  patient was in agreement with this.  The patient admitted. Pacemaker  implanted on November 12, 2006. The patient tolerated procedure.  Post  pacemaker x-ray showing a 20% left pneumothorax.  The patient underwent  placement of a chest tube by Brett Canales minor on November 12, 2006.  The pacemaker  interrogated on November 13, 2006, Medtronic representative traced at home.  Dr. Shelle Iron in the see the patient on November 13, 2006.  Chest tube to 20 cm  suction. Suction discontinued.  Patient stable for the night. On the  morning of July 5, patient off suction. No obvious air leak. Chest tube  discontinued with followup chest x-ray showing stable tiny left apical  pneumothorax. Dr. Graciela Husbands also in to examine the patient.  Patient  afebrile, blood pressure stable 123/76.   The patient is being discharged home in stable condition..  The patient  to follow up with Rubye Oaks, nurse practitioner, per Dr. Shelle Iron in  office next week for suture removal.  The patient to follow up with  pacer clinic Monday, July 21, at 9:20 a.m. and Dr. Graciela Husbands Tuesday,  October 28 at 9:30 a.m..  The patient to follow up primary cardiologist,  Dr. Jens Som, within the next 2 months. The patient was given the post  pacemaker discharge  instruction sheet along with a prescription for metoprolol  succinate 25  mg 1 tablet p.o. daily, Prilosec as previously prescribed.  She also has  been given a prescription for Percocet 5/325 1 tablet p.o. q.6 h p.r.n.,  10 tablets no refills.   Duration of discharge time:  Greater than 30 minutes.      Dorian Pod, ACNP      Duke Salvia, MD, [facility]  Electronically Signed    MB/MEDQ  D:  11/14/2006  T:  11/15/2006  Job:  [id number]   cc:   Madolyn Frieze. Jens Som, MD, [facility]  Barbette Hair. Artist Pais, DO  Barbaraann Share, MD,FCCP

## 2010-09-27 NOTE — Op Note (Signed)
NAME:  Alexandria Wright, Alexandria Wright             ACCOUNT NO.:  [id number]   MEDICAL RECORD NO.:  [id number]          PATIENT TYPE:  OIB   LOCATION:  2899                         FACILITY:  [facility]   PHYSICIAN:  Duke Salvia, MD, FACCDATE OF BIRTH:  04/08/1930   DATE OF PROCEDURE:  08/07/2006  DATE OF DISCHARGE:                               OPERATIVE REPORT   PREOPERATIVE DIAGNOSIS:  Recurrent syncope with nonsustained ventricular  tachycardia and normal heart.   POSTOPERATIVE DIAGNOSIS:  Recurrent syncope with nonsustained  ventricular tachycardia and normal heart.   PROCEDURE:  Implantable loop recorder insertion.   SURGEON:  Duke Salvia, MD.   DESCRIPTION OF PROCEDURE:  Following attainment of informed consent, the  patient was brought to the catheterization laboratory and placed on the  fluoroscopic table in the supine position.  After routine prep and drape  and transcutaneous mapping, a site was identified that moved in a 45-  degrees angle from sternum towards the left upper quadrant that seemed  to avoid the fulcrum of her ribs in this rather thin lady.  An incision  was made and carried down to the layer of the prepectoral fascia.  A  pocket was formed similarly and hemostasis was obtained.   Then two 2-0 silk sutures were placed at the cephalad head and then a  Medtronic Reveal [id number] device, serial number [id number] was inserted  and secured to the prepectoral fascia.  The wound was closed in 3 layers  in a normal fashion.  The wound was washed, dried and a benzoin and  Steri-Strip dressing was applied.  Needle counts, sponge counts and  instrument counts were correct at the end of the procedure according to  the staff.  The patient tolerated the procedure without apparent  complication.      Duke Salvia, MD, [facility]  Electronically Signed     SCK/MEDQ  D:  08/07/2006  T:  08/07/2006  Job:  [id number]   cc:   [facility]  [facility] Catheterization Laboratory
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
## 2010-09-27 NOTE — Assessment & Plan Note (Signed)
[facility]                            CARDIOLOGY OFFICE NOTE   Alexandria Wright, Alexandria Wright                    MRN:          [id number]  DATE:08/31/2006                            DOB:          10-12-1929    Alexandria Wright is a very pleasant 77 year old female that I initially  evaluated on June 25, 2006 secondary to history of syncopal  episodes. She had a Myoview performed on July 01, 2006 that showed  an ejection fraction of 78% and normal perfusion. An echocardiogram was  preformed on June 23, 2006 that showed normal LV function, mild  mitral regurgitation with mild prolapse of the interior leaflet and mild  left atrial enlargement. She did have a CardioNet as well. This revealed  nonsustained ventricular tachycardia at a rate 240 that lasted for 11  beats. We therefore placed her on Toprol and referred her to Dr. Graciela Husbands  for evaluation. He did place a loop monitor on August 07, 2006. Since  then she has not had any further syncopal episodes. She had 1 episode of  dyspnea last Thursday and was interrogated but those results are not  available. She has not had chest pain. Her medications include;  1. Fosamax.  2. Os-Cal.  3. Multivitamin.  4. Prilosec.  5. Toprol 25 mg p.o. daily.   Her physical exam today shows a blood pressure of 144/74 and her pulse  is 65.  NECK: Supple.  CHEST: Clear.  CARDIOVASCULAR EXAM: Reveals a regular rate and rhythm. Her loop implant  site shows no evidence of infection.  EXTREMITIES: Show no edema.   DIAGNOSES:  1. History of syncopal episodes of uncertain etiology.  2. Nonsustained ventricular tachycardia on CardioNet monitor.  3. Status post loop.  4. History of esophageal strictures status post dilation.   PLAN:  Alexandria Wright has had no further presyncopal episodes. We will  schedule her to have an MRI to exclude any structural abnormality that  may be contributing to her ventricular tachycardia.  This is at Dr.  Odessa Fleming suggestion. We will  otherwise continue to follow her  expectantly and hopefully her loop monitor will elucidate the cause of  her syncope. I will see her back in 4 months.    Madolyn Frieze Jens Som, MD, [facility]  Electronically Signed   BSC/MedQ  DD: 08/31/2006  DT: 08/31/2006  Job #: [id number]   cc:   Barbette Hair. Artist Pais, DO
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
## 2010-09-27 NOTE — Discharge Summary (Signed)
NAME:  Alexandria Wright, Alexandria Wright             ACCOUNT NO.:  [id number]   MEDICAL RECORD NO.:  [id number]          PATIENT TYPE:  OIB   LOCATION:  2899                         FACILITY:  [facility]   PHYSICIAN:  Duke Salvia, MD, FACCDATE OF BIRTH:  1930-02-29   DATE OF ADMISSION:  08/07/2006  DATE OF DISCHARGE:  08/07/2006                               DISCHARGE SUMMARY   ALLERGIES:  TO:  1. PENICILLIN.  2. SULFA.   DISCHARGE:  Greater than 25 minutes.   PRINCIPAL DIAGNOSIS:  Syncope of unknown etiology.   PROCEDURE:  Implant loop recorder [employer] device, a reveal DX [id number].  The patient has done well with this, has been instructed in its use and  will go home same day.  Incision looks good.  She is to remove the  bandage in the morning of March 29.  She is to sponge bath until Friday,  April 4.  She has an incision check at [facility], [address], Thursday, April 17 at 9.   MEDICATIONS:  Are:  1. Toprol 25 mg daily.  2. Multivitamin daily.  3. Prilosec OTC 20 mg daily.  4. Os-Cal 1 tab daily.  5. Fosamax 70 mg daily.      Maple Mirza, [place]      Duke Salvia, MD, [facility]  Electronically Signed    GM/MEDQ  D:  08/07/2006  T:  08/07/2006  Job:  [id number]   cc:   Barbette Hair. Artist Pais, DO

## 2010-10-10 ENCOUNTER — Other Ambulatory Visit: Payer: Self-pay

## 2010-10-10 ENCOUNTER — Ambulatory Visit (INDEPENDENT_AMBULATORY_CARE_PROVIDER_SITE_OTHER): Payer: Medicare Other | Admitting: *Deleted

## 2010-10-10 DIAGNOSIS — I498 Other specified cardiac arrhythmias: Secondary | ICD-10-CM

## 2010-10-16 NOTE — Progress Notes (Signed)
Pacer remote 

## 2010-11-10 ENCOUNTER — Encounter: Payer: Self-pay | Admitting: *Deleted

## 2011-01-09 ENCOUNTER — Ambulatory Visit (INDEPENDENT_AMBULATORY_CARE_PROVIDER_SITE_OTHER): Payer: Medicare Other | Admitting: *Deleted

## 2011-01-09 DIAGNOSIS — R55 Syncope and collapse: Secondary | ICD-10-CM

## 2011-01-09 DIAGNOSIS — I498 Other specified cardiac arrhythmias: Secondary | ICD-10-CM

## 2011-01-24 NOTE — Progress Notes (Signed)
Pacer checked by remote 

## 2011-02-06 ENCOUNTER — Encounter: Payer: Self-pay | Admitting: *Deleted

## 2011-04-08 ENCOUNTER — Other Ambulatory Visit: Payer: Self-pay | Admitting: Cardiology

## 2011-04-10 ENCOUNTER — Encounter: Payer: Self-pay | Admitting: Internal Medicine

## 2011-04-10 ENCOUNTER — Ambulatory Visit (INDEPENDENT_AMBULATORY_CARE_PROVIDER_SITE_OTHER): Payer: Medicare Other | Admitting: *Deleted

## 2011-04-10 ENCOUNTER — Other Ambulatory Visit: Payer: Self-pay | Admitting: Internal Medicine

## 2011-04-10 DIAGNOSIS — I495 Sick sinus syndrome: Secondary | ICD-10-CM

## 2011-04-11 LAB — REMOTE PACEMAKER DEVICE
AL AMPLITUDE: 2.8 mv
AL IMPEDENCE PM: 588 Ohm
AL THRESHOLD: 0.75 V
ATRIAL PACING PM: 13
BAMS-0001: 175 {beats}/min
BATTERY VOLTAGE: 2.77 V
RV LEAD AMPLITUDE: 22.4 mv
RV LEAD IMPEDENCE PM: 699 Ohm
RV LEAD THRESHOLD: 0.625 V
VENTRICULAR PACING PM: 0

## 2011-04-15 NOTE — Progress Notes (Signed)
Remote pacer check  

## 2011-04-17 ENCOUNTER — Encounter: Payer: Self-pay | Admitting: *Deleted

## 2011-05-05 ENCOUNTER — Encounter: Payer: Self-pay | Admitting: Internal Medicine

## 2011-07-08 ENCOUNTER — Encounter: Payer: Self-pay | Admitting: Internal Medicine

## 2011-07-08 ENCOUNTER — Ambulatory Visit (INDEPENDENT_AMBULATORY_CARE_PROVIDER_SITE_OTHER): Payer: Medicare Other | Admitting: Internal Medicine

## 2011-07-08 DIAGNOSIS — I495 Sick sinus syndrome: Secondary | ICD-10-CM

## 2011-07-08 DIAGNOSIS — R55 Syncope and collapse: Secondary | ICD-10-CM

## 2011-07-08 DIAGNOSIS — Z950 Presence of cardiac pacemaker: Secondary | ICD-10-CM

## 2011-07-08 LAB — PACEMAKER DEVICE OBSERVATION
AL THRESHOLD: 0.75 V
ATRIAL PACING PM: 13
RV LEAD THRESHOLD: 0.5 V

## 2011-07-08 NOTE — Patient Instructions (Addendum)
Your physician wants you to follow-up in: 1 year.   You will receive a reminder letter in the mail two months in advance. If you don't receive a letter, please call our office to schedule the follow-up appointment.   Your telephone pacemaker check will be on Oct 09, 2011.

## 2011-07-08 NOTE — Progress Notes (Signed)
  HPI  Alexandria Wright is a 78 y.o. female s seen in followup for syncope, bradycardia, and status post pacemaker implantation. She also had nonsustained ventricular tachycardia that turned out to be asymptomatic. She has had no  recurrent symptoms since the pacemaker was implanted in October 2008.  she continues to walk 8 miles a day  The patient denies chest pain, shortness of breath, nocturnal dyspnea, orthopnea or peripheral edema.  There have been no palpitations, lightheadedness or syncope.      Current Outpatient Prescriptions  Medication Sig Dispense Refill  . Calcium Carbonate-Vitamin D (OSCAL 500/200 D-3 PO) Take by mouth daily.      . Cholecalciferol (VITAMIN D PO) Take 2,000 Units by mouth daily.      . metoprolol succinate (TOPROL-XL) 25 MG 24 hr tablet TAKE ONE TABLET BY MOUTH ONE TIME DAILY  90 tablet  3  . Multiple Vitamin (MULTI-VITAMIN PO) Take by mouth daily.      . raloxifene (EVISTA) 60 MG tablet Take 60 mg by mouth daily.        Allergies  Allergen Reactions  . Penicillins   . Sulfonamide Derivatives     Review of Systems negative except from HPI and PMH  Physical Exam BP 125/60  Pulse 65  Ht 5\' 4"  (1.626 m)  Wt 107 lb 1.9 oz (48.589 kg)  BMI 18.39 kg/m2 Well developed and nourished in no acute distress HENT normal Neck supple with JVP-flat Carotids brisk and full without bruits Clear Regular rate and rhythm, no murmurs or gallops Abd-soft with active BS without hepatomegaly No Clubbing cyanosis edema Skin-warm and dry A & Oriented  Grossly normal sensory and motor function   Assessment and  Plan

## 2011-07-08 NOTE — Assessment & Plan Note (Signed)
No syncope.   

## 2011-07-08 NOTE — Assessment & Plan Note (Signed)
The patient's device was interrogated.  The information was reviewed. No changes were made in the programming.    

## 2011-07-08 NOTE — Assessment & Plan Note (Signed)
12% atrial pacing

## 2011-10-09 ENCOUNTER — Encounter: Payer: Self-pay | Admitting: Internal Medicine

## 2011-10-09 ENCOUNTER — Ambulatory Visit (INDEPENDENT_AMBULATORY_CARE_PROVIDER_SITE_OTHER): Payer: Medicare Other | Admitting: *Deleted

## 2011-10-09 DIAGNOSIS — I495 Sick sinus syndrome: Secondary | ICD-10-CM

## 2011-10-09 DIAGNOSIS — Z950 Presence of cardiac pacemaker: Secondary | ICD-10-CM

## 2011-10-10 LAB — REMOTE PACEMAKER DEVICE
AL IMPEDENCE PM: 560 Ohm
BATTERY VOLTAGE: 2.76 V
RV LEAD AMPLITUDE: 11.2 mv

## 2011-10-17 ENCOUNTER — Encounter: Payer: Self-pay | Admitting: *Deleted

## 2011-12-10 HISTORY — PX: CATARACT EXTRACTION: SUR2

## 2011-12-17 HISTORY — PX: CATARACT EXTRACTION: SUR2

## 2012-01-15 ENCOUNTER — Encounter: Payer: Self-pay | Admitting: Internal Medicine

## 2012-01-15 ENCOUNTER — Ambulatory Visit (INDEPENDENT_AMBULATORY_CARE_PROVIDER_SITE_OTHER): Payer: Medicare Other | Admitting: *Deleted

## 2012-01-15 DIAGNOSIS — I495 Sick sinus syndrome: Secondary | ICD-10-CM

## 2012-01-15 DIAGNOSIS — Z950 Presence of cardiac pacemaker: Secondary | ICD-10-CM

## 2012-01-20 LAB — REMOTE PACEMAKER DEVICE
AL AMPLITUDE: 2.8 mv
AL THRESHOLD: 1 V
BAMS-0001: 175 {beats}/min
RV LEAD AMPLITUDE: 22.4 mv
RV LEAD THRESHOLD: 0.5 V
VENTRICULAR PACING PM: 1

## 2012-01-22 ENCOUNTER — Ambulatory Visit (INDEPENDENT_AMBULATORY_CARE_PROVIDER_SITE_OTHER): Payer: Medicare Other | Admitting: Gastroenterology

## 2012-01-22 ENCOUNTER — Encounter: Payer: Self-pay | Admitting: Gastroenterology

## 2012-01-22 VITALS — BP 112/68 | HR 88 | Ht 63.25 in | Wt 105.2 lb

## 2012-01-22 DIAGNOSIS — K222 Esophageal obstruction: Secondary | ICD-10-CM

## 2012-01-22 DIAGNOSIS — Z950 Presence of cardiac pacemaker: Secondary | ICD-10-CM

## 2012-01-22 DIAGNOSIS — R1319 Other dysphagia: Secondary | ICD-10-CM

## 2012-01-22 DIAGNOSIS — R1314 Dysphagia, pharyngoesophageal phase: Secondary | ICD-10-CM

## 2012-01-22 DIAGNOSIS — Q391 Atresia of esophagus with tracheo-esophageal fistula: Secondary | ICD-10-CM

## 2012-01-22 DIAGNOSIS — R1310 Dysphagia, unspecified: Secondary | ICD-10-CM

## 2012-01-22 NOTE — Patient Instructions (Addendum)
You have been scheduled for an endoscopy with propofol. Please follow written instructions given to you at your visit today.   CC: Bufford Spikes, M. D.

## 2012-01-22 NOTE — Progress Notes (Signed)
History of Present Illness:  This is a he Caucasian female with 6-7 months of progressive solid food dysphagia. She denies chronic acid reflux symptoms. She has a history of a Schatzki's ring in her distal esophagus with last endoscopic dilation 3 years ago. Her only other medical problem is a history of complete heart block requiring pacemaker insertion, and she is followed by Dr. Graciela Husbands and cardiology. She is up-to-date on her colonoscopy exam. She denies anorexia, weight loss, nausea vomiting, abuse of alcohol, cigarettes, NSAIDs, or recent antibiotic use.  I have reviewed this patient's present history, medical and surgical past history, allergies and medications.     ROS: The remainder of the 10 point ROS is negative... she apparently walks 8 miles 5 days a week and denies any cardiovascular or pulmonary complaints. She does take Toprol XL 25 mg a day.      Physical Exam:Very thin but healthy-appearing patient in no distress. Blood pressure 112/68, pulse 88 and regular, and weight 105 pounds the BMI of 18.5.  General well developed well nourished patient in no acute distress, appearing their stated age Eyes PERRLA, no icterus, fundoscopic exam per opthamologist Skin no lesions noted Neck supple, no adenopathy, no thyroid enlargement, no tenderness Chest clear to percussion and auscultation Heart no significant murmurs, gallops or rubs noted Abdomen no hepatosplenomegaly masses or tenderness, BS normal.  Extremities no acute joint lesions, edema, phlebitis or evidence of cellulitis. Neurologic patient oriented x 3, cranial nerves intact, no focal neurologic deficits noted. Psychological mental status normal and normal affect.  Assessment and plan:Recurrent solid food dysphagia related to distal esophageal obstruction felt secondary to recurrent Schatzki's ring. There is been no evidence of an esophageal motility disturbance, and this patient has done well for several years in between her  dilations. Her last dilation was successful with a hydrostatic balloon technique. I have rescheduled her endoscopy with dilation as indicated. The risk and benefits of this procedure have been reviewed with the patient, and she has agreed to see as planned. She is not on anticoagulants. Please copy this note to her primary care physician.  No diagnosis found.

## 2012-01-26 ENCOUNTER — Ambulatory Visit (AMBULATORY_SURGERY_CENTER): Payer: Medicare Other | Admitting: Gastroenterology

## 2012-01-26 ENCOUNTER — Encounter: Payer: Self-pay | Admitting: Gastroenterology

## 2012-01-26 VITALS — BP 125/74 | HR 70 | Temp 97.4°F | Resp 20 | Ht 63.0 in | Wt 105.0 lb

## 2012-01-26 DIAGNOSIS — K219 Gastro-esophageal reflux disease without esophagitis: Secondary | ICD-10-CM

## 2012-01-26 DIAGNOSIS — K222 Esophageal obstruction: Secondary | ICD-10-CM

## 2012-01-26 DIAGNOSIS — R1314 Dysphagia, pharyngoesophageal phase: Secondary | ICD-10-CM

## 2012-01-26 MED ORDER — SODIUM CHLORIDE 0.9 % IV SOLN: 500.0000 mL | INTRAVENOUS | Status: DC
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
## 2012-01-26 NOTE — Patient Instructions (Addendum)
Discharge instructions given with verbal understanding. Dilatation diet given. Resume previous medications. YOU HAD AN ENDOSCOPIC PROCEDURE TODAY AT THE [facility]: Refer to the procedure report that was given to you for any specific questions about what was found during the examination.  If the procedure report does not answer your questions, please call your gastroenterologist to clarify.  If you requested that your care partner not be given the details of your procedure findings, then the procedure report has been included in a sealed envelope for you to review at your convenience later.  YOU SHOULD EXPECT: Some feelings of bloating in the abdomen. Passage of more gas than usual.  Walking can help get rid of the air that was put into your GI tract during the procedure and reduce the bloating. If you had a lower endoscopy (such as a colonoscopy or flexible sigmoidoscopy) you may notice spotting of blood in your stool or on the toilet paper. If you underwent a bowel prep for your procedure, then you may not have a normal bowel movement for a few days.  DIET: Your first meal following the procedure should be a light meal and then it is ok to progress to your normal diet.  A half-sandwich or bowl of soup is an example of a good first meal.  Heavy or fried foods are harder to digest and may make you feel nauseous or bloated.  Likewise meals heavy in dairy and vegetables can cause extra gas to form and this can also increase the bloating.  Drink plenty of fluids but you should avoid alcoholic beverages for 24 hours.  ACTIVITY: Your care partner should take you home directly after the procedure.  You should plan to take it easy, moving slowly for the rest of the day.  You can resume normal activity the day after the procedure however you should NOT DRIVE or use heavy machinery for 24 hours (because of the sedation medicines used during the test).    SYMPTOMS TO REPORT IMMEDIATELY: A  gastroenterologist can be reached at any hour.  During normal business hours, 8:30 AM to 5:00 PM Monday through Friday, call [phone].  After hours and on weekends, please call the GI answering service at [phone] who will take a message and have the physician on call contact you.   Following upper endoscopy (EGD)  Vomiting of blood or coffee ground material  New chest pain or pain under the shoulder blades  Painful or persistently difficult swallowing  New shortness of breath  Fever of 100F or higher  Black, tarry-looking stools  FOLLOW UP: If any biopsies were taken you will be contacted by phone or by letter within the next 1-3 weeks.  Call your gastroenterologist if you have not heard about the biopsies in 3 weeks.  Our staff will call the home number listed on your records the next business day following your procedure to check on you and address any questions or concerns that you may have at that time regarding the information given to you following your procedure. This is a courtesy call and so if there is no answer at the home number and we have not heard from you through the emergency physician on call, we will assume that you have returned to your regular daily activities without incident.  SIGNATURES/CONFIDENTIALITY: You and/or your care partner have signed paperwork which will be entered into your electronic medical record.  These signatures attest to the fact that that the information above on your  After Visit Summary has been reviewed and is understood.  Full responsibility of the confidentiality of this discharge information lies with you and/or your care-partner.

## 2012-01-26 NOTE — Op Note (Signed)
[facility] [address] [place], [address]   ENDOSCOPY PROCEDURE REPORT  PATIENT: Alexandria Wright, Alexandria Wright  MR#: [id number] BIRTHDATE: 1930/02/13 , 82  yrs. old GENDER: Female ENDOSCOPIST:Lilyannah Zuelke Hale Bogus, MD, [facility] REFERRED BY: PROCEDURE DATE:  01/26/2012 PROCEDURE:   EGD, diagnostic and EGD w/ tube or catheter placement  ASA CLASS:    Class III INDICATIONS: dysphagia. MEDICATION: propofol (Diprivan) 100mg  IV TOPICAL ANESTHETIC:   Cetacaine Spray  DESCRIPTION OF PROCEDURE:   After the risks and benefits of the procedure were explained, informed consent was obtained.  The LB [id number] [id number]  endoscope was introduced through the mouth  and advanced to the second portion of the duodenum .  The instrument was slowly withdrawn as the mucosa was fully examined.      ESOPHAGUS: A large hiatal hernia was noted.   The esophagus was otherwise normal.  TIGHT STRICTURE DILATED BALLON DILATOR FOR 2 MTS...SEE PICTURES.  DUODENUM: The duodenal mucosa showed no abnormalities in the entire duodenum.  STOMACH: The mucosa of the stomach appeared normal.    Retroflexed views revealed a hiatal hernia.    The scope was then withdrawn from the patient and the procedure completed.  COMPLICATIONS: There were no complications.   ENDOSCOPIC IMPRESSION: 1.   Large hiatal hernia 2.   The esophagus was otherwise normal. 3.   TIGHT STRICTURE DILATED BALLON DILATOR FOR 2 MTS...SEE PICTURES 4.   The duodenal mucosa showed no abnormalities in the entire duodenum 5.   The mucosa of the stomach appeared normal  RECOMMENDATIONS: 1.  continue current medications 2.  Clear liquids until , then soft foods rest iof day.  Resume prior diet tomorrow. 3.  dilatations PRN  CC TIFFANYREED,DO  _______________________________ eSigned:  Mardella Layman, MD, [facility] 01/26/2012 2:15 PM   standard discharge   PATIENT NAME:  Alexandria Wright, Alexandria Wright MR#: [id number]

## 2012-01-27 ENCOUNTER — Telehealth: Payer: Self-pay | Admitting: *Deleted

## 2012-01-27 NOTE — Telephone Encounter (Signed)
  Follow up Call-  Call back number 01/26/2012  Post procedure Call Back phone  # [phone]  Permission to leave phone message No  comments no answering machine     Patient questions:  Do you have a fever, pain , or abdominal swelling? no Pain Score  0 *  Have you tolerated food without any problems? yes  Have you been able to return to your normal activities? yes  Do you have any questions about your discharge instructions: Diet   no Medications  no Follow up visit  no  Do you have questions or concerns about your Care? no  Actions: * If pain score is 4 or above: No action needed, pain <4.

## 2012-02-24 ENCOUNTER — Encounter: Payer: Self-pay | Admitting: *Deleted

## 2012-04-26 ENCOUNTER — Encounter: Payer: Self-pay | Admitting: Internal Medicine

## 2012-04-26 ENCOUNTER — Ambulatory Visit (INDEPENDENT_AMBULATORY_CARE_PROVIDER_SITE_OTHER): Payer: Medicare Other | Admitting: *Deleted

## 2012-04-26 DIAGNOSIS — I495 Sick sinus syndrome: Secondary | ICD-10-CM

## 2012-04-26 DIAGNOSIS — Z950 Presence of cardiac pacemaker: Secondary | ICD-10-CM

## 2012-04-28 ENCOUNTER — Other Ambulatory Visit: Payer: Self-pay | Admitting: Cardiology

## 2012-05-08 LAB — REMOTE PACEMAKER DEVICE
AL IMPEDENCE PM: 503 Ohm
AL THRESHOLD: 0.875 V
ATRIAL PACING PM: 6
BATTERY VOLTAGE: 2.77 V

## 2012-05-18 ENCOUNTER — Encounter: Payer: Self-pay | Admitting: *Deleted

## 2012-07-13 ENCOUNTER — Encounter: Payer: Medicare Other | Admitting: Internal Medicine

## 2012-09-10 ENCOUNTER — Encounter: Payer: Self-pay | Admitting: Internal Medicine

## 2012-09-10 ENCOUNTER — Ambulatory Visit (INDEPENDENT_AMBULATORY_CARE_PROVIDER_SITE_OTHER): Payer: Medicare PPO | Admitting: Internal Medicine

## 2012-09-10 VITALS — BP 130/70 | HR 68 | Ht 64.0 in | Wt 101.0 lb

## 2012-09-10 DIAGNOSIS — Z950 Presence of cardiac pacemaker: Secondary | ICD-10-CM

## 2012-09-10 DIAGNOSIS — I495 Sick sinus syndrome: Secondary | ICD-10-CM

## 2012-09-10 DIAGNOSIS — R55 Syncope and collapse: Secondary | ICD-10-CM

## 2012-09-10 DIAGNOSIS — Q670 Congenital facial asymmetry: Secondary | ICD-10-CM | POA: Insufficient documentation

## 2012-09-10 DIAGNOSIS — Q674 Other congenital deformities of skull, face and jaw: Secondary | ICD-10-CM

## 2012-09-10 LAB — PACEMAKER DEVICE OBSERVATION
AL IMPEDENCE PM: 551 Ohm
AL THRESHOLD: 0.75 V
RV LEAD IMPEDENCE PM: 680 Ohm

## 2012-09-10 NOTE — Progress Notes (Signed)
Patient Care Team: Kermit Balo, DO as PCP - General (Geriatric Medicine)   HPI  Alexandria Wright is a 79 y.o. female seen in followup for syncope, bradycardia, and status post pacemaker implantation in October 2008.   She also had nonsustained ventricular tachycardia that turned out to be asymptomatic.    The patient denies chest pain, shortness of breath, nocturnal dyspnea, orthopnea or peripheral edema. There have been no palpitations, lightheadedness or syncope.   Past Medical History  Diagnosis Date  . Syncope   . Tachycardia     asyptomatic nonsustained  . Bradycardia     symptomatic  . GERD (gastroesophageal reflux disease)   . Esophageal stricture   . Skin cancer     Past Surgical History  Procedure Laterality Date  . Pacemaker placement  2008  . Tonsillectomy and adenoidectomy    . Skin cancer excision    . Cataract extraction  12/10/2011    right   . Cataract extraction  12/17/2011    left    Current Outpatient Prescriptions  Medication Sig Dispense Refill  . Calcium Carbonate-Vitamin D (OSCAL 500/200 D-3 PO) Take by mouth daily.      . Cholecalciferol (VITAMIN D PO) Take 2,000 Units by mouth daily.      . metoprolol succinate (TOPROL-XL) 25 MG 24 hr tablet TAKE ONE TABLET BY MOUTH ONE TIME DAILY  90 tablet  2  . Multiple Vitamin (MULTI-VITAMIN PO) Take by mouth daily.       No current facility-administered medications for this visit.    Allergies  Allergen Reactions  . Penicillins   . Sulfonamide Derivatives     Review of Systems negative except from HPI and PMH  Physical Exam BP 130/70  Pulse 68  Ht 5\' 4"  (1.626 m)  Wt 101 lb (45.813 kg)  BMI 17.33 kg/m2 Well developed and nourished in no acute distress HENT asymmetric with fullness of left cheek and frontal space and neck Neck supple with JVP-flat Clear Regular rate and rhythm, no murmurs or gallops Abd-soft with active BS No Clubbing cyanosis edema Skin-warm and dry A & Oriented   Grossly normal sensory and motor function     Assessment and  Plan

## 2012-09-10 NOTE — Assessment & Plan Note (Signed)
7% atrial pacing

## 2012-09-10 NOTE — Patient Instructions (Addendum)
Remote monitoring is used to monitor your Pacemaker of ICD from home. This monitoring reduces the number of office visits required to check your device to one time per year. It allows us to keep an eye on the functioning of your device to ensure it is working properly. You are scheduled for a device check from home on 12/13/12. You may send your transmission at any time that day. If you have a wireless device, the transmission will be sent automatically. After your physician reviews your transmission, you will receive a postcard with your next transmission date.  Your physician wants you to follow-up in: 1 year with Dr Klein. You will receive a reminder letter in the mail two months in advance. If you don't receive a letter, please call our office to schedule the follow-up appointment.  

## 2012-09-10 NOTE — Assessment & Plan Note (Signed)
No recurrneces

## 2012-09-10 NOTE — Assessment & Plan Note (Signed)
The patient's device was interrogated.  The information was reviewed. No changes were made in the programming.    

## 2012-09-10 NOTE — Assessment & Plan Note (Signed)
Pt noted to have facial assymrtry after she complained of intermittent facial swelling Compared to driver's license from 3086 and it appears about the same. I told her to check pictures at home and is not convinced she should followup with her PCP

## 2012-12-13 ENCOUNTER — Ambulatory Visit (INDEPENDENT_AMBULATORY_CARE_PROVIDER_SITE_OTHER): Payer: Medicare PPO | Admitting: *Deleted

## 2012-12-13 DIAGNOSIS — Z950 Presence of cardiac pacemaker: Secondary | ICD-10-CM

## 2012-12-13 DIAGNOSIS — I495 Sick sinus syndrome: Secondary | ICD-10-CM

## 2012-12-17 LAB — REMOTE PACEMAKER DEVICE
AL THRESHOLD: 1 V
BAMS-0001: 175 {beats}/min
BATTERY VOLTAGE: 2.75 V
RV LEAD AMPLITUDE: 22.4 mv
RV LEAD THRESHOLD: 0.5 V

## 2012-12-22 ENCOUNTER — Ambulatory Visit (INDEPENDENT_AMBULATORY_CARE_PROVIDER_SITE_OTHER): Payer: Medicare PPO | Admitting: Internal Medicine

## 2012-12-22 ENCOUNTER — Encounter: Payer: Self-pay | Admitting: Internal Medicine

## 2012-12-22 VITALS — BP 124/80 | HR 96 | Temp 98.0°F | Resp 14 | Ht 64.0 in | Wt 100.2 lb

## 2012-12-22 DIAGNOSIS — J069 Acute upper respiratory infection, unspecified: Secondary | ICD-10-CM

## 2012-12-22 DIAGNOSIS — H6123 Impacted cerumen, bilateral: Secondary | ICD-10-CM

## 2012-12-22 DIAGNOSIS — H6120 Impacted cerumen, unspecified ear: Secondary | ICD-10-CM

## 2012-12-22 MED ORDER — AZITHROMYCIN 250 MG PO TABS: ORAL_TABLET | ORAL | Status: DC

## 2012-12-22 MED ORDER — GUAIFENESIN-DM 100-10 MG/5ML PO SYRP: 5.0000 mL | ORAL_SOLUTION | Freq: Three times a day (TID) | ORAL | Status: DC | PRN

## 2012-12-22 NOTE — Progress Notes (Signed)
Patient ID: Alexandria Wright, female   DOB: 26-May-1929, 79 y.o.   MRN: [id number]  Chief Complaint  Patient presents with  . Cough    started Sunday at church   HPI She has been having cough since Sunday. She is bringing up yellow phlegm mostly.She has generalized myalgia. It hurts in her chest area with excessive coughing. She feels stuffed up in her head, sinuses and chest and feels the cough is not coming out fully. She has not been able to do her regular 8 mile working as she gets short of breath. Has lost her appetite. No nausea or vomiting. No altered bowel movement Denies earache or discharge but has fullness in both ears Has runny nose Denies any fever or chills  ROS No urinary complaints No falls No dizziness or headaches  Past Medical History  Diagnosis Date  . Syncope   . Tachycardia     asyptomatic nonsustained  . Bradycardia     symptomatic  . GERD (gastroesophageal reflux disease)   . Esophageal stricture   . Skin cancer    Allergies  Allergen Reactions  . Penicillins   . Sulfonamide Derivatives    BP 124/80  Pulse 96  Temp(Src) 98 F (36.7 C) (Oral)  Resp 14  Ht 5\' 4"  (1.626 m)  Wt 100 lb 3.2 oz (45.45 kg)  BMI 17.19 kg/m2  Physical Exam  Constitutional: She is oriented to person, place, and time. She appears well-developed and well-nourished.  HENT:  Head: Normocephalic and atraumatic.  oropharyngeal erythema, both ears impacted with cerumen  Neck: Normal range of motion. Neck supple. No JVD present. No thyromegaly present.  Cardiovascular: Normal rate, regular rhythm and normal heart sounds.   Pulmonary/Chest: Effort normal and breath sounds normal.  Abdominal: Soft. Bowel sounds are normal.  Musculoskeletal: Normal range of motion. She exhibits no edema.  Lymphadenopathy:    She has no cervical adenopathy.  Neurological: She is alert and oriented to person, place, and time.  Skin: Skin is warm and dry. She is not diaphoretic.    Assessment/plan  URI- likely has viral uri superseded by bacterial infection. Will have her on azithromycin for 5 days with robitussin to help with her uri. Encouraged hydration and rest  Impacted cerumen- will get ear lavage for both ears

## 2013-01-11 ENCOUNTER — Encounter: Payer: Self-pay | Admitting: *Deleted

## 2013-02-15 ENCOUNTER — Encounter: Payer: Self-pay | Admitting: Internal Medicine

## 2013-03-10 ENCOUNTER — Other Ambulatory Visit: Payer: Self-pay | Admitting: Cardiology

## 2013-03-21 ENCOUNTER — Ambulatory Visit (INDEPENDENT_AMBULATORY_CARE_PROVIDER_SITE_OTHER): Payer: Medicare PPO | Admitting: *Deleted

## 2013-03-21 DIAGNOSIS — Z950 Presence of cardiac pacemaker: Secondary | ICD-10-CM

## 2013-03-21 DIAGNOSIS — I495 Sick sinus syndrome: Secondary | ICD-10-CM

## 2013-03-21 LAB — MDC_IDC_ENUM_SESS_TYPE_REMOTE
Battery Impedance: 1106 Ohm
Battery Remaining Longevity: 46 mo
Battery Voltage: 2.75 V
Brady Statistic AP VP Percent: 0 %
Lead Channel Impedance Value: 580 Ohm
Lead Channel Sensing Intrinsic Amplitude: 2.8 mV
Lead Channel Setting Pacing Amplitude: 1.875
Lead Channel Setting Pacing Amplitude: 2 V
Lead Channel Setting Pacing Pulse Width: 0.4 ms

## 2013-04-19 ENCOUNTER — Encounter: Payer: Self-pay | Admitting: *Deleted

## 2013-05-09 ENCOUNTER — Encounter: Payer: Self-pay | Admitting: Internal Medicine

## 2013-06-24 ENCOUNTER — Ambulatory Visit (INDEPENDENT_AMBULATORY_CARE_PROVIDER_SITE_OTHER): Payer: Medicare PPO | Admitting: *Deleted

## 2013-06-24 DIAGNOSIS — I495 Sick sinus syndrome: Secondary | ICD-10-CM

## 2013-06-24 LAB — MDC_IDC_ENUM_SESS_TYPE_REMOTE
Brady Statistic AP VP Percent: 0.1 %
Brady Statistic AP VS Percent: 6.1 %
Brady Statistic AS VP Percent: 0.7 %
Brady Statistic AS VS Percent: 93.1 %
Lead Channel Impedance Value: 680 Ohm
Lead Channel Pacing Threshold Amplitude: 0.5 V
Lead Channel Pacing Threshold Pulse Width: 0.4 ms
Lead Channel Pacing Threshold Pulse Width: 0.4 ms
Lead Channel Sensing Intrinsic Amplitude: 22.4 mV
Lead Channel Setting Pacing Amplitude: 1.75 V
Lead Channel Setting Pacing Amplitude: 2 V
Lead Channel Setting Sensing Sensitivity: 5.6 mV
MDC IDC MSMT LEADCHNL RA IMPEDANCE VALUE: 519 Ohm
MDC IDC MSMT LEADCHNL RA PACING THRESHOLD AMPLITUDE: 0.875 V
MDC IDC MSMT LEADCHNL RA SENSING INTR AMPL: 2.8 mV
MDC IDC SET LEADCHNL RV PACING PULSEWIDTH: 0.4 ms

## 2013-08-01 ENCOUNTER — Encounter: Payer: Self-pay | Admitting: *Deleted

## 2013-08-09 ENCOUNTER — Encounter: Payer: Self-pay | Admitting: Internal Medicine

## 2013-08-22 ENCOUNTER — Ambulatory Visit (INDEPENDENT_AMBULATORY_CARE_PROVIDER_SITE_OTHER): Payer: Medicare PPO | Admitting: Internal Medicine

## 2013-08-22 ENCOUNTER — Encounter: Payer: Self-pay | Admitting: Internal Medicine

## 2013-08-22 VITALS — BP 120/76 | HR 58 | Temp 97.0°F | Resp 10 | Ht 63.08 in | Wt 99.4 lb

## 2013-08-22 DIAGNOSIS — K222 Esophageal obstruction: Secondary | ICD-10-CM

## 2013-08-22 DIAGNOSIS — I10 Essential (primary) hypertension: Secondary | ICD-10-CM

## 2013-08-22 DIAGNOSIS — E785 Hyperlipidemia, unspecified: Secondary | ICD-10-CM

## 2013-08-22 DIAGNOSIS — R634 Abnormal weight loss: Secondary | ICD-10-CM

## 2013-08-22 DIAGNOSIS — K219 Gastro-esophageal reflux disease without esophagitis: Secondary | ICD-10-CM

## 2013-08-22 DIAGNOSIS — H6123 Impacted cerumen, bilateral: Secondary | ICD-10-CM

## 2013-08-22 DIAGNOSIS — H6120 Impacted cerumen, unspecified ear: Secondary | ICD-10-CM

## 2013-08-22 DIAGNOSIS — I495 Sick sinus syndrome: Secondary | ICD-10-CM

## 2013-08-22 DIAGNOSIS — M810 Age-related osteoporosis without current pathological fracture: Secondary | ICD-10-CM

## 2013-08-22 MED ORDER — ZOSTER VACCINE LIVE 19400 UNT/0.65ML ~~LOC~~ SOLR: 0.6500 mL | Freq: Once | SUBCUTANEOUS | Status: DC

## 2013-08-22 NOTE — Progress Notes (Signed)
Patient ID: NYEISHA GOODALL, female   DOB: [birth year], 80 y.o.   MRN: [id number]   Location:  [facility]  Code Status: DNR, has living will and HCPOA that she will bring in;  MOST form done with her today--DNR, limited interventions with hospitalizations--ok with antibiotics and fluids short term if appropriate;  no "machines", says her family, no tube feedings   Allergies  Allergen Reactions  . Penicillins   . Sulfonamide Derivatives     Chief Complaint  Patient presents with  . Annual Exam    Yearly Exam with fasting labs, EKG completed, MMSE 29/30, Eye exam: up to date (? 01/2013)    HPI: Patient is a 80 y.o. white female seen in the office today for annual exam.  Nothing new.  Feeling pretty good.  Back still every now and then aches her, but not as much as it used to.  Is trying to drink more water than she used to.  Was walking 8 miles a day 5 days per week and was not thirsty.  Now making sure she drinks at least 6 glasses per day.  Goes early in the am.    Was treated again for skin cancer on the forehead.    No episodes of passing out.  No palpitations.  Swallowing very seldom acts up.  Dr. Sharlett Iles retired.  She hopes she won't have to see anyone about her stricture ever again.  Goes to see Dr. Caryl Comes 09/13/13 and gets checked by phone every 3 mos.    Vision doing well since cataracts removed--is great.  Hearing well.  Review of Systems:  Review of Systems  Constitutional: Negative for malaise/fatigue.  HENT: Negative for hearing loss.   Eyes: Negative for blurred vision.  Respiratory: Negative for shortness of breath.   Cardiovascular: Negative for chest pain, leg swelling and PND.  Gastrointestinal: Negative for constipation, blood in stool and melena.  Genitourinary: Negative for dysuria, urgency and frequency.  Musculoskeletal: Negative for falls.  Skin: Negative for rash.  Neurological: Negative for dizziness and loss of  consciousness.  Endo/Heme/Allergies: Bruises/bleeds easily.  Psychiatric/Behavioral: Negative for depression and memory loss.     Past Medical History  Diagnosis Date  . Syncope   . Tachycardia     asyptomatic nonsustained  . Bradycardia     symptomatic  . GERD (gastroesophageal reflux disease)   . Esophageal stricture   . Skin cancer     Past Surgical History  Procedure Laterality Date  . Pacemaker placement  2008  . Tonsillectomy and adenoidectomy    . Skin cancer excision    . Cataract extraction  12/10/2011    right   . Cataract extraction  12/17/2011    left    Social History:   reports that she has never smoked. She has never used smokeless tobacco. She reports that she does not drink alcohol or use illicit drugs.  Family History  Problem Relation Age of Onset  . Heart disease Mother   . Colon cancer Neg Hx     Medications: Patient's Medications  New Prescriptions   No medications on file  Previous Medications   CALCIUM CARBONATE-VITAMIN D (OSCAL 500/200 D-3 PO)    Take by mouth daily.   CHOLECALCIFEROL (VITAMIN D PO)    Take 2,000 Units by mouth daily.   METOPROLOL SUCCINATE (TOPROL-XL) 25 MG 24 HR TABLET    Take one tablet by mouth one time daily   MULTIPLE VITAMIN (MULTI-VITAMIN  PO)    Take by mouth daily.  Modified Medications   Modified Medication Previous Medication   ZOSTER VACCINE LIVE, PF, (ZOSTAVAX) 96295 UNT/0.65ML INJECTION zoster vaccine live, PF, (ZOSTAVAX) 28413 UNT/0.65ML injection      Inject 19,400 Units into the skin once.    Inject 0.65 mLs into the skin once.  Discontinued Medications   AZITHROMYCIN (ZITHROMAX) 250 MG TABLET    Take 2 tablets today and then one everyday for next 4 days.   GUAIFENESIN-DEXTROMETHORPHAN (ROBITUSSIN DM) 100-10 MG/5ML SYRUP    Take 5 mL by mouth 3 (three) times daily as needed for cough.     Physical Exam: Filed Vitals:   08/22/13 1339  BP: 120/76  Pulse: 58  Temp: 97 F (36.1 C)  TempSrc: Oral    Resp: 10  Height: 5' 3.08" (1.602 m)  Weight: 99 lb 6.4 oz (45.088 kg)  SpO2: 98%   Physical Exam  Constitutional: She is oriented to person, place, and time. No distress.  Very thin white female  HENT:  Head: Normocephalic and atraumatic.  Right Ear: External ear normal.  Left Ear: External ear normal.  Nose: Nose normal.  Mouth/Throat: Oropharynx is clear and moist. No oropharyngeal exudate.  Cerumen impaction bilaterally  Eyes: Conjunctivae and EOM are normal. Pupils are equal, round, and reactive to light.  Neck: Normal range of motion. Neck supple. No JVD present. No tracheal deviation present. No thyromegaly present.  Cardiovascular: Normal rate, regular rhythm, normal heart sounds and intact distal pulses.   No murmur heard. Pulmonary/Chest: Effort normal and breath sounds normal. No respiratory distress.  Abdominal: Soft. Bowel sounds are normal. She exhibits no distension and no mass. There is no tenderness.  Genitourinary:  No suprapubic tenderness  Musculoskeletal: Normal range of motion. She exhibits no edema and no tenderness.  Neurological: She is alert and oriented to person, place, and time. She has normal reflexes. No cranial nerve deficit.  Skin: Skin is warm and dry.  Recovering from skin cancer surgery  Psychiatric: She has a normal mood and affect.    Labs reviewed: Last in 2012 MMSE today 29/30, failed clock  Assessment/Plan 1. Sinoatrial node dysfunction -stable, had normal pacer check with cardiology  2. Stricture and stenosis of esophagus -had esophageal dilatation and stable since  3. GERD -no changes needed  4. Impacted cerumen of both ears -flushed bilaterally  5. Essential hypertension, benign -bp at goal with current therapy  6. Senile osteoporosis -cont ca with d supplements, high ca diet and walking  7. Loss of weight -advised she does not need to walk as much as she is due to her weight loss, but is doing this to help her  cholesterol--recheck   Labs/tests ordered: Orders Placed This Encounter  Procedures  . Comprehensive metabolic panel  . Lipid panel  . CBC With differential/Platelet  . TSH    Next appt:  1 year physical

## 2013-08-22 NOTE — Progress Notes (Signed)
Failed clock drawing placed hands at 11:05 vs 11:10

## 2013-08-23 LAB — CBC WITH DIFFERENTIAL
Basophils Absolute: 0.1 10*3/uL (ref 0.0–0.2)
Basos: 1 %
Eos: 2 %
Eosinophils Absolute: 0.1 10*3/uL (ref 0.0–0.4)
HCT: 46.2 % (ref 34.0–46.6)
Hemoglobin: 15.9 g/dL (ref 11.1–15.9)
Immature Grans (Abs): 0 10*3/uL (ref 0.0–0.1)
Immature Granulocytes: 0 %
Lymphocytes Absolute: 0.9 10*3/uL (ref 0.7–3.1)
Lymphs: 15 %
MCH: 31.4 pg (ref 26.6–33.0)
MCHC: 34.4 g/dL (ref 31.5–35.7)
MCV: 91 fL (ref 79–97)
Monocytes Absolute: 0.7 10*3/uL (ref 0.1–0.9)
Monocytes: 10 %
Neutrophils Absolute: 4.5 10*3/uL (ref 1.4–7.0)
Neutrophils Relative %: 72 %
Platelets: 246 10*3/uL (ref 150–379)
RBC: 5.06 x10E6/uL (ref 3.77–5.28)
RDW: 13.9 % (ref 12.3–15.4)
WBC: 6.3 10*3/uL (ref 3.4–10.8)

## 2013-08-23 LAB — COMPREHENSIVE METABOLIC PANEL
ALT: 12 IU/L (ref 0–32)
AST: 17 IU/L (ref 0–40)
Albumin/Globulin Ratio: 1.8 (ref 1.1–2.5)
Albumin: 4.5 g/dL (ref 3.5–4.7)
Alkaline Phosphatase: 69 IU/L (ref 39–117)
BUN/Creatinine Ratio: 18 (ref 11–26)
BUN: 16 mg/dL (ref 8–27)
CO2: 27 mmol/L (ref 18–29)
Calcium: 10.3 mg/dL (ref 8.7–10.3)
Chloride: 100 mmol/L (ref 97–108)
Creatinine, Ser: 0.87 mg/dL (ref 0.57–1.00)
GFR calc Af Amer: 72 mL/min/{1.73_m2} (ref >59)
GFR calc non Af Amer: 63 mL/min/{1.73_m2} (ref >59)
Globulin, Total: 2.5 g/dL (ref 1.5–4.5)
Glucose: 99 mg/dL (ref 65–99)
Potassium: 5.4 mmol/L — ABNORMAL HIGH (ref 3.5–5.2)
Sodium: 140 mmol/L (ref 134–144)
Total Bilirubin: 0.4 mg/dL (ref 0.0–1.2)
Total Protein: 7 g/dL (ref 6.0–8.5)

## 2013-08-23 LAB — LIPID PANEL
Chol/HDL Ratio: 3.2 ratio units (ref 0.0–4.4)
Cholesterol, Total: 242 mg/dL — ABNORMAL HIGH (ref 100–199)
HDL: 76 mg/dL (ref >39)
LDL Calculated: 144 mg/dL — ABNORMAL HIGH (ref 0–99)
Triglycerides: 109 mg/dL (ref 0–149)
VLDL Cholesterol Cal: 22 mg/dL (ref 5–40)

## 2013-08-23 LAB — TSH: TSH: 2.5 u[IU]/mL (ref 0.450–4.500)

## 2013-09-13 ENCOUNTER — Ambulatory Visit (INDEPENDENT_AMBULATORY_CARE_PROVIDER_SITE_OTHER): Payer: Medicare PPO | Admitting: Internal Medicine

## 2013-09-13 ENCOUNTER — Encounter: Payer: Self-pay | Admitting: Internal Medicine

## 2013-09-13 VITALS — BP 114/65 | HR 65 | Ht 64.0 in | Wt 98.0 lb

## 2013-09-13 DIAGNOSIS — I495 Sick sinus syndrome: Secondary | ICD-10-CM

## 2013-09-13 DIAGNOSIS — R001 Bradycardia, unspecified: Secondary | ICD-10-CM

## 2013-09-13 DIAGNOSIS — Z950 Presence of cardiac pacemaker: Secondary | ICD-10-CM

## 2013-09-13 DIAGNOSIS — I498 Other specified cardiac arrhythmias: Secondary | ICD-10-CM

## 2013-09-13 LAB — MDC_IDC_ENUM_SESS_TYPE_INCLINIC
Battery Impedance: 1359 Ohm
Battery Remaining Longevity: 40 mo
Battery Voltage: 2.74 V
Brady Statistic AP VS Percent: 7 %
Date Time Interrogation Session: 20150505092502
Lead Channel Impedance Value: 561 Ohm
Lead Channel Impedance Value: 635 Ohm
Lead Channel Pacing Threshold Amplitude: 1 V
Lead Channel Pacing Threshold Pulse Width: 0.4 ms
Lead Channel Pacing Threshold Pulse Width: 0.4 ms
Lead Channel Setting Pacing Amplitude: 2 V
Lead Channel Setting Pacing Amplitude: 2 V
Lead Channel Setting Pacing Pulse Width: 0.4 ms
Lead Channel Setting Sensing Sensitivity: 5.6 mV
MDC IDC MSMT LEADCHNL RA PACING THRESHOLD AMPLITUDE: 0.75 V
MDC IDC MSMT LEADCHNL RA SENSING INTR AMPL: 2.8 mV
MDC IDC MSMT LEADCHNL RV SENSING INTR AMPL: 15.67 mV
MDC IDC STAT BRADY AP VP PERCENT: 0 %
MDC IDC STAT BRADY AS VP PERCENT: 1 %
MDC IDC STAT BRADY AS VS PERCENT: 93 %

## 2013-09-13 NOTE — Patient Instructions (Signed)

## 2013-09-13 NOTE — Progress Notes (Signed)
      Patient Care Team: Gayland Curry, DO as PCP - General (Geriatric Medicine)   HPI  Alexandria Wright is a 80 y.o. female seen in followup for syncope, bradycardia, and status post pacemaker implantation in October 2008.   She also had nonsustained ventricular tachycardia that turned out to be asymptomatic.  The patient denies chest pain, shortness of breath, nocturnal dyspnea, orthopnea or peripheral edema. There have been no palpitations, lightheadedness or syncope.    Past Medical History  Diagnosis Date  . Syncope   . Tachycardia     asyptomatic nonsustained  . Bradycardia     symptomatic  . GERD (gastroesophageal reflux disease)   . Esophageal stricture   . Skin cancer     Past Surgical History  Procedure Laterality Date  . Pacemaker placement  2008  . Tonsillectomy and adenoidectomy    . Skin cancer excision    . Cataract extraction  12/10/2011    right   . Cataract extraction  12/17/2011    left    Current Outpatient Prescriptions  Medication Sig Dispense Refill  . Calcium Carbonate-Vitamin D (OSCAL 500/200 D-3 PO) Take by mouth daily.      . Cholecalciferol (VITAMIN D PO) Take 2,000 Units by mouth daily.      . metoprolol succinate (TOPROL-XL) 25 MG 24 hr tablet Take one tablet by mouth one time daily  90 tablet  1  . Multiple Vitamin (MULTI-VITAMIN PO) Take by mouth daily.      Marland Kitchen zoster vaccine live, PF, (ZOSTAVAX) 97026 UNT/0.65ML injection Inject 19,400 Units into the skin once.  1 each  0   No current facility-administered medications for this visit.    Allergies  Allergen Reactions  . Penicillins   . Sulfonamide Derivatives     Review of Systems negative except from HPI and PMH  Physical Exam BP 114/65  Pulse 65  Ht 5\' 4"  (1.626 m)  Wt 98 lb (44.453 kg)  BMI 16.81 kg/m2 Well developed and nourished in no acute distress HENT normal Neck supple  Device pocket well healed; without hematoma or erythema.  There is no tethering   Clear Regular rate and rhythm, no murmurs or gallops Abd-soft with active BS No Clubbing cyanosis edema Skin-warm and dry A & Oriented  Grossly normal sensory and motor function     Assessment and  Plan  Sinus node dysfunction  Pacemaker [employer]  The patient's device was interrogated.  The information was reviewed. No changes were made in the programming.    otherwise stable

## 2013-09-14 ENCOUNTER — Other Ambulatory Visit: Payer: Self-pay | Admitting: Cardiology

## 2013-12-15 ENCOUNTER — Ambulatory Visit (INDEPENDENT_AMBULATORY_CARE_PROVIDER_SITE_OTHER): Payer: Medicare PPO | Admitting: *Deleted

## 2013-12-15 DIAGNOSIS — I495 Sick sinus syndrome: Secondary | ICD-10-CM

## 2013-12-15 NOTE — Progress Notes (Signed)
Remote pacemaker transmission.   

## 2013-12-16 LAB — MDC_IDC_ENUM_SESS_TYPE_REMOTE
Battery Remaining Longevity: 37 mo
Battery Voltage: 2.74 V
Brady Statistic AP VS Percent: 2 %
Lead Channel Impedance Value: 544 Ohm
Lead Channel Pacing Threshold Amplitude: 0.5 V
Lead Channel Pacing Threshold Amplitude: 0.875 V
Lead Channel Pacing Threshold Pulse Width: 0.4 ms
Lead Channel Pacing Threshold Pulse Width: 0.4 ms
Lead Channel Sensing Intrinsic Amplitude: 11.2 mV
Lead Channel Setting Pacing Amplitude: 1.75 V
Lead Channel Setting Pacing Amplitude: 2 V
Lead Channel Setting Pacing Pulse Width: 0.4 ms
Lead Channel Setting Sensing Sensitivity: 5.6 mV
MDC IDC MSMT BATTERY IMPEDANCE: 1506 Ohm
MDC IDC MSMT LEADCHNL RA IMPEDANCE VALUE: 537 Ohm
MDC IDC MSMT LEADCHNL RA SENSING INTR AMPL: 2.8 mV
MDC IDC SESS DTM: 20150806111347
MDC IDC STAT BRADY AP VP PERCENT: 0 %
MDC IDC STAT BRADY AS VP PERCENT: 0 %
MDC IDC STAT BRADY AS VS PERCENT: 97 %

## 2014-01-03 ENCOUNTER — Encounter: Payer: Self-pay | Admitting: Cardiology

## 2014-01-11 ENCOUNTER — Encounter: Payer: Self-pay | Admitting: Internal Medicine

## 2014-03-20 ENCOUNTER — Ambulatory Visit (INDEPENDENT_AMBULATORY_CARE_PROVIDER_SITE_OTHER): Payer: Medicare PPO | Admitting: *Deleted

## 2014-03-20 DIAGNOSIS — I495 Sick sinus syndrome: Secondary | ICD-10-CM

## 2014-03-20 LAB — MDC_IDC_ENUM_SESS_TYPE_REMOTE
Battery Impedance: 1714 Ohm
Battery Voltage: 2.74 V
Brady Statistic AP VS Percent: 2 %
Brady Statistic AS VP Percent: 1 %
Brady Statistic AS VS Percent: 97 %
Date Time Interrogation Session: 20151109145032
Lead Channel Impedance Value: 589 Ohm
Lead Channel Impedance Value: 631 Ohm
Lead Channel Pacing Threshold Amplitude: 0.5 V
Lead Channel Pacing Threshold Amplitude: 1 V
Lead Channel Pacing Threshold Pulse Width: 0.4 ms
Lead Channel Pacing Threshold Pulse Width: 0.4 ms
Lead Channel Sensing Intrinsic Amplitude: 22.4 mV
Lead Channel Setting Pacing Amplitude: 2 V
Lead Channel Setting Pacing Amplitude: 2 V
MDC IDC MSMT BATTERY REMAINING LONGEVITY: 32 mo
MDC IDC MSMT LEADCHNL RA SENSING INTR AMPL: 2.8 mV
MDC IDC SET LEADCHNL RV PACING PULSEWIDTH: 0.4 ms
MDC IDC SET LEADCHNL RV SENSING SENSITIVITY: 5.6 mV
MDC IDC STAT BRADY AP VP PERCENT: 0 %

## 2014-03-20 NOTE — Progress Notes (Signed)
Remote pacemaker transmission.   

## 2014-03-23 ENCOUNTER — Other Ambulatory Visit: Payer: Self-pay

## 2014-03-23 MED ORDER — METOPROLOL SUCCINATE ER 25 MG PO TB24: ORAL_TABLET | ORAL | Status: DC

## 2014-04-10 ENCOUNTER — Telehealth: Payer: Self-pay | Admitting: Internal Medicine

## 2014-04-10 NOTE — Telephone Encounter (Signed)
Spoke with [facility]) who had questions about Alexandria Wright using a new type of heating pad with vibration.  I referred her to [employer] Insurance claims handler.

## 2014-04-10 NOTE — Telephone Encounter (Signed)
New message      Talk to the nurse----want to make sure it is ok for her to do something since she has a pacemaker.  She would not tell me what she wanted to do

## 2014-04-12 ENCOUNTER — Encounter: Payer: Self-pay | Admitting: Cardiology

## 2014-04-17 ENCOUNTER — Encounter: Payer: Self-pay | Admitting: Internal Medicine

## 2014-06-21 ENCOUNTER — Ambulatory Visit (INDEPENDENT_AMBULATORY_CARE_PROVIDER_SITE_OTHER): Payer: Medicare PPO | Admitting: *Deleted

## 2014-06-21 DIAGNOSIS — I495 Sick sinus syndrome: Secondary | ICD-10-CM

## 2014-06-21 LAB — MDC_IDC_ENUM_SESS_TYPE_REMOTE
Battery Impedance: 1743 Ohm
Battery Voltage: 2.74 V
Brady Statistic AP VP Percent: 0 %
Brady Statistic AP VS Percent: 3 %
Brady Statistic AS VP Percent: 0 %
Brady Statistic AS VS Percent: 97 %
Date Time Interrogation Session: 20160210150754
Lead Channel Impedance Value: 596 Ohm
Lead Channel Impedance Value: 627 Ohm
Lead Channel Pacing Threshold Amplitude: 0.5 V
Lead Channel Pacing Threshold Amplitude: 0.875 V
Lead Channel Pacing Threshold Pulse Width: 0.4 ms
Lead Channel Pacing Threshold Pulse Width: 0.4 ms
Lead Channel Sensing Intrinsic Amplitude: 11.2 mV
Lead Channel Setting Pacing Amplitude: 2 V
Lead Channel Setting Sensing Sensitivity: 5.6 mV
MDC IDC MSMT BATTERY REMAINING LONGEVITY: 32 mo
MDC IDC MSMT LEADCHNL RA SENSING INTR AMPL: 1.4 mV
MDC IDC SET LEADCHNL RA PACING AMPLITUDE: 1.75 V
MDC IDC SET LEADCHNL RV PACING PULSEWIDTH: 0.4 ms

## 2014-06-21 NOTE — Progress Notes (Signed)
Remote pacemaker transmission.   

## 2014-07-10 ENCOUNTER — Encounter: Payer: Self-pay | Admitting: Cardiology

## 2014-07-17 ENCOUNTER — Encounter: Payer: Self-pay | Admitting: Internal Medicine

## 2014-08-25 ENCOUNTER — Ambulatory Visit (INDEPENDENT_AMBULATORY_CARE_PROVIDER_SITE_OTHER): Payer: Medicare PPO | Admitting: Internal Medicine

## 2014-08-25 ENCOUNTER — Encounter: Payer: Self-pay | Admitting: Internal Medicine

## 2014-08-25 VITALS — BP 126/78 | HR 64 | Resp 12 | Ht 64.0 in | Wt 93.2 lb

## 2014-08-25 DIAGNOSIS — I495 Sick sinus syndrome: Secondary | ICD-10-CM | POA: Diagnosis not present

## 2014-08-25 DIAGNOSIS — Z0000 Encounter for general adult medical examination without abnormal findings: Secondary | ICD-10-CM | POA: Diagnosis not present

## 2014-08-25 DIAGNOSIS — Z950 Presence of cardiac pacemaker: Secondary | ICD-10-CM | POA: Diagnosis not present

## 2014-08-25 DIAGNOSIS — E875 Hyperkalemia: Secondary | ICD-10-CM | POA: Insufficient documentation

## 2014-08-25 DIAGNOSIS — M545 Low back pain, unspecified: Secondary | ICD-10-CM | POA: Insufficient documentation

## 2014-08-25 DIAGNOSIS — Z23 Encounter for immunization: Secondary | ICD-10-CM

## 2014-08-25 DIAGNOSIS — M810 Age-related osteoporosis without current pathological fracture: Secondary | ICD-10-CM

## 2014-08-25 DIAGNOSIS — R634 Abnormal weight loss: Secondary | ICD-10-CM | POA: Diagnosis not present

## 2014-08-25 DIAGNOSIS — I10 Essential (primary) hypertension: Secondary | ICD-10-CM | POA: Diagnosis not present

## 2014-08-25 DIAGNOSIS — E7800 Pure hypercholesterolemia, unspecified: Secondary | ICD-10-CM

## 2014-08-25 NOTE — Progress Notes (Signed)
Passed clock drawing 

## 2014-08-25 NOTE — Progress Notes (Signed)
Patient ID: Alexandria Wright, female   DOB: 02-28-1930, 81 y.o.   MRN: [id number]   Location:  [facility]  Code Status: DNR Goals of Care: Advanced Directive information Does patient have an advance directive?: Yes, Type of Advance Directive: Living will   Allergies  Allergen Reactions  . Penicillins   . Sulfonamide Derivatives     Chief Complaint  Patient presents with  . Annual Exam    Yearly check-up, EKG completed 09/13/13, fasting for labs due. MMSE 29/30, passed clock drawing   . Immunizations    Prevnar 13    HPI: Patient is a 81 y.o.  seen in the office today for her annual exam. MMSE - Mini Mental State Exam 08/25/2014 08/22/2013  Orientation to time 5 5  Orientation to Place 5 5  Registration 3 3  Attention/ Calculation 5 5  Recall 2 2  Language- name 2 objects 2 2  Language- repeat 1 1  Language- follow 3 step command 3 3  Language- read & follow direction 1 1  Write a sentence 1 1  Copy design 1 1  Total score 29 29   Last labs were one year ago and normal except high potassium and LDL which does not warrant treatment with a high HDL.  Not depressed No falls Needs prevnar Missed flu shot last year Had pneumovax and tdap Refuses mammograms, paps, pelvics, colon cancer screening due to age and not wanting intervention if anything worrisome like cancer was found. Living will on record as of today.  Still has some lower back pain.  Says aspercreme is as good as any of the expensive creams.  Every now and then, may taken an aspirin, but not regularly.    Lost 5 lbs--says she is not concerned about it--stopped sodas and ice cream.  Heard about bad effects of sodas.  Was using soft drinks as her fluid.    Potassium had been high.  Only thing on list of foods that increases her potassium was potatoes, but she doesn't eat any other the others.  Review of Systems:  Review of Systems  Constitutional: Positive for weight  loss. Negative for fever and chills.  HENT: Negative for hearing loss.   Eyes: Negative for blurred vision.  Respiratory: Negative for shortness of breath.   Cardiovascular: Negative for chest pain and leg swelling.  Gastrointestinal: Negative for constipation, blood in stool and melena.  Genitourinary: Negative for dysuria, urgency and frequency.  Musculoskeletal: Positive for back pain. Negative for falls.  Skin: Negative for rash.  Neurological: Negative for dizziness.  Endo/Heme/Allergies: Bruises/bleeds easily.  Psychiatric/Behavioral: Negative for depression and memory loss.     Past Medical History  Diagnosis Date  . Syncope   . Tachycardia     asyptomatic nonsustained  . Bradycardia     symptomatic  . GERD (gastroesophageal reflux disease)   . Esophageal stricture   . Skin cancer     Past Surgical History  Procedure Laterality Date  . Pacemaker placement  2008  . Tonsillectomy and adenoidectomy    . Skin cancer excision    . Cataract extraction  12/10/2011    right   . Cataract extraction  12/17/2011    left    Social History:   reports that she has never smoked. She has never used smokeless tobacco. She reports that she does not drink alcohol or use illicit drugs.  Family History  Problem Relation Age of Onset  . Heart  disease Mother   . Colon cancer Neg Hx     Medications: Patient's Medications  New Prescriptions   No medications on file  Previous Medications   CALCIUM CARBONATE-VITAMIN D (OSCAL 500/200 D-3 PO)    Take by mouth daily.   CHOLECALCIFEROL (VITAMIN D PO)    Take 2,000 Units by mouth daily.   METOPROLOL SUCCINATE (TOPROL-XL) 25 MG 24 HR TABLET    TAKE ONE TABLET BY MOUTH ONE TIME DAILY   MULTIPLE VITAMIN (MULTI-VITAMIN PO)    Take by mouth daily.  Modified Medications   No medications on file  Discontinued Medications   ZOSTER VACCINE LIVE, PF, (ZOSTAVAX) 28413 UNT/0.65ML INJECTION    Inject 19,400 Units into the skin once.      Physical Exam: Filed Vitals:   08/25/14 0853  BP: 126/78  Pulse: 64  Resp: 12  Height: 5\' 4"  (1.626 m)  Weight: 93 lb 3.2 oz (42.275 kg)  SpO2: 92%  Physical Exam  Constitutional: She is oriented to person, place, and time. No distress.  Thin white female  HENT:  Head: Normocephalic and atraumatic.  Right Ear: External ear normal.  Left Ear: External ear normal.  Nose: Nose normal.  Mouth/Throat: Oropharynx is clear and moist. No oropharyngeal exudate.  Cerumen in bilateral ears, but have not been able to remove due to the angle of her auditory canals  Eyes: Conjunctivae and EOM are normal. Pupils are equal, round, and reactive to light.  Neck: Normal range of motion. Neck supple. No JVD present. No thyromegaly present.  Cardiovascular: Normal rate, regular rhythm, normal heart sounds and intact distal pulses.   Pulmonary/Chest: Effort normal and breath sounds normal. No respiratory distress. Right breast exhibits no inverted nipple, no mass, no nipple discharge, no skin change and no tenderness. Left breast exhibits no inverted nipple, no mass, no nipple discharge, no skin change and no tenderness.  Several seborrheic keratoses present on chest wall  Abdominal: Soft. Bowel sounds are normal. She exhibits no distension and no mass. There is no tenderness.  Genitourinary:  refuses  Musculoskeletal: Normal range of motion. She exhibits no edema or tenderness.  Lymphadenopathy:    She has no cervical adenopathy.  Neurological: She is alert and oriented to person, place, and time. She has normal reflexes. No cranial nerve deficit. She exhibits normal muscle tone. Coordination normal.  Skin: Skin is warm and dry. She is not diaphoretic.  Psychiatric: She has a normal mood and affect. Her behavior is normal. Judgment and thought content normal.    Labs reviewed: Last labs were 1 year ago so done today and will do again in 1 year at physical  Assessment/Plan 1. Essential  hypertension, benign -bp at goal with metoprolol alone and regular exercise, has always followed low sodium diet  2. Sinoatrial node dysfunction -is s/p pacer for this, no problems noted  3. S/P placement of cardiac pacemaker -has f/u appt upcoming with cardiology and gets interrogated by phone  4. Senile osteoporosis -cont ca with D and vitamin D regularly  5. Midline low back pain without sciatica -cont otc aspercreme as needed which has been best conservative treatment for her and she does not want prescription meds or interventions -some due to her scoliosis  6. Loss of weight -has cut out sodas and ice cream since last visit a year ago and continues to walk long distances daily -she attributes the dietary changes to her weight loss and refuses any routine cancer screenings due to her age and  goals of care -provided declaration for natural death form today which was scanned  7. Need for vaccination with 13-polyvalent pneumococcal conjugate vaccine -prevnar was given  8.  Routine general medical exam: see hpi for annual screening and preventive care info  Labs/tests ordered:   Orders Placed This Encounter  Procedures  . CBC with Differential/Platelet  . Comprehensive metabolic panel    Order Specific Question:  Has the patient fasted?    Answer:  Yes  . Lipid panel    Order Specific Question:  Has the patient fasted?    Answer:  Yes  . CBC with Differential/Platelet    Standing Status: Future     Number of Occurrences:      Standing Expiration Date: 08/24/2016  . Comprehensive metabolic panel    Standing Status: Future     Number of Occurrences:      Standing Expiration Date: 08/24/2016    Order Specific Question:  Has the patient fasted?    Answer:  Yes  . Lipid panel    Standing Status: Future     Number of Occurrences:      Standing Expiration Date: 08/24/2016    Order Specific Question:  Has the patient fasted?    Answer:  Yes    Next appt:  1 year with labs  before  Grabill Tijana Walder, D.O. [facility] [address], [place] [address] Cell Phone (Mon-Fri 8am-5pm):  [phone] On Call:  [phone] & follow prompts after 5pm & weekends Office Phone:  [phone] Office Fax:  [phone]

## 2014-08-26 LAB — CBC WITH DIFFERENTIAL/PLATELET
Basophils Absolute: 0 10*3/uL (ref 0.0–0.2)
Basos: 1 %
Eos: 1 %
Eosinophils Absolute: 0.1 10*3/uL (ref 0.0–0.4)
HCT: 47.4 % — ABNORMAL HIGH (ref 34.0–46.6)
Hemoglobin: 16.1 g/dL — ABNORMAL HIGH (ref 11.1–15.9)
Immature Grans (Abs): 0 10*3/uL (ref 0.0–0.1)
Immature Granulocytes: 0 %
Lymphocytes Absolute: 0.8 10*3/uL (ref 0.7–3.1)
Lymphs: 11 %
MCH: 31.3 pg (ref 26.6–33.0)
MCHC: 34 g/dL (ref 31.5–35.7)
MCV: 92 fL (ref 79–97)
Monocytes Absolute: 0.7 10*3/uL (ref 0.1–0.9)
Monocytes: 10 %
Neutrophils Absolute: 5.7 10*3/uL (ref 1.4–7.0)
Neutrophils Relative %: 77 %
Platelets: 245 10*3/uL (ref 150–379)
RBC: 5.15 x10E6/uL (ref 3.77–5.28)
RDW: 13.7 % (ref 12.3–15.4)
WBC: 7.4 10*3/uL (ref 3.4–10.8)

## 2014-08-26 LAB — LIPID PANEL
Chol/HDL Ratio: 3 ratio units (ref 0.0–4.4)
Cholesterol, Total: 235 mg/dL — ABNORMAL HIGH (ref 100–199)
HDL: 78 mg/dL (ref >39)
LDL Calculated: 133 mg/dL — ABNORMAL HIGH (ref 0–99)
Triglycerides: 121 mg/dL (ref 0–149)
VLDL Cholesterol Cal: 24 mg/dL (ref 5–40)

## 2014-08-26 LAB — COMPREHENSIVE METABOLIC PANEL
ALT: 13 IU/L (ref 0–32)
AST: 18 IU/L (ref 0–40)
Albumin/Globulin Ratio: 1.9 (ref 1.1–2.5)
Albumin: 4.7 g/dL (ref 3.5–4.7)
Alkaline Phosphatase: 68 IU/L (ref 39–117)
BUN/Creatinine Ratio: 26 (ref 11–26)
BUN: 21 mg/dL (ref 8–27)
Bilirubin Total: 0.5 mg/dL (ref 0.0–1.2)
CO2: 22 mmol/L (ref 18–29)
Calcium: 9.7 mg/dL (ref 8.7–10.3)
Chloride: 97 mmol/L (ref 97–108)
Creatinine, Ser: 0.8 mg/dL (ref 0.57–1.00)
GFR calc Af Amer: 79 mL/min/{1.73_m2} (ref >59)
GFR calc non Af Amer: 69 mL/min/{1.73_m2} (ref >59)
Globulin, Total: 2.5 g/dL (ref 1.5–4.5)
Glucose: 91 mg/dL (ref 65–99)
Potassium: 3.8 mmol/L (ref 3.5–5.2)
Sodium: 140 mmol/L (ref 134–144)
Total Protein: 7.2 g/dL (ref 6.0–8.5)

## 2014-09-14 ENCOUNTER — Other Ambulatory Visit: Payer: Self-pay | Admitting: Internal Medicine

## 2014-09-26 ENCOUNTER — Other Ambulatory Visit: Payer: Self-pay | Admitting: Internal Medicine

## 2014-10-04 ENCOUNTER — Encounter: Payer: Self-pay | Admitting: Internal Medicine

## 2014-10-04 ENCOUNTER — Ambulatory Visit (INDEPENDENT_AMBULATORY_CARE_PROVIDER_SITE_OTHER): Payer: Medicare PPO | Admitting: Internal Medicine

## 2014-10-04 VITALS — BP 132/74 | HR 75 | Ht 64.0 in | Wt 92.4 lb

## 2014-10-04 DIAGNOSIS — Z45018 Encounter for adjustment and management of other part of cardiac pacemaker: Secondary | ICD-10-CM | POA: Diagnosis not present

## 2014-10-04 DIAGNOSIS — I495 Sick sinus syndrome: Secondary | ICD-10-CM

## 2014-10-04 LAB — CUP PACEART INCLINIC DEVICE CHECK
Battery Impedance: 1952 Ohm
Battery Remaining Longevity: 28 mo
Brady Statistic AP VP Percent: 0 %
Brady Statistic AP VS Percent: 3 %
Brady Statistic AS VP Percent: 0 %
Brady Statistic AS VS Percent: 96 %
Date Time Interrogation Session: 20160525155654
Lead Channel Impedance Value: 611 Ohm
Lead Channel Pacing Threshold Pulse Width: 0.4 ms
Lead Channel Pacing Threshold Pulse Width: 0.4 ms
Lead Channel Sensing Intrinsic Amplitude: 2.8 mV
Lead Channel Setting Pacing Amplitude: 2.5 V
Lead Channel Setting Pacing Pulse Width: 0.4 ms
MDC IDC MSMT BATTERY VOLTAGE: 2.73 V
MDC IDC MSMT LEADCHNL RA PACING THRESHOLD AMPLITUDE: 0.75 V
MDC IDC MSMT LEADCHNL RV IMPEDANCE VALUE: 622 Ohm
MDC IDC MSMT LEADCHNL RV PACING THRESHOLD AMPLITUDE: 0.5 V
MDC IDC MSMT LEADCHNL RV SENSING INTR AMPL: 15.67 mV
MDC IDC SET LEADCHNL RA PACING AMPLITUDE: 2 V
MDC IDC SET LEADCHNL RV SENSING SENSITIVITY: 5.6 mV

## 2014-10-04 NOTE — Progress Notes (Signed)
      Patient Care Team: Gayland Curry, DO as PCP - General (Geriatric Medicine)   HPI  Alexandria Wright is a 81 y.o. female seen in followup for syncope, bradycardia, and status post pacemaker implantation in October 2008.   She also had nonsustained ventricular tachycardia that turned out to be asymptomatic.  The patient denies chest pain, shortness of breath, nocturnal dyspnea, orthopnea or peripheral edema. There have been no palpitations, lightheadedness or syncope.    Past Medical History  Diagnosis Date  . Syncope   . Tachycardia     asyptomatic nonsustained  . Bradycardia     symptomatic  . GERD (gastroesophageal reflux disease)   . Esophageal stricture   . Skin cancer     Past Surgical History  Procedure Laterality Date  . Pacemaker placement  2008  . Tonsillectomy and adenoidectomy    . Skin cancer excision    . Cataract extraction  12/10/2011    right   . Cataract extraction  12/17/2011    left    Current Outpatient Prescriptions  Medication Sig Dispense Refill  . Calcium Carbonate-Vitamin D (OSCAL 500/200 D-3 PO) Take 1 tablet by mouth daily.     . Cholecalciferol (VITAMIN D PO) Take 2,000 Units by mouth daily.    . metoprolol succinate (TOPROL-XL) 25 MG 24 hr tablet TAKE ONE TABLET BY MOUTH ONE TIME DAILY 90 tablet 0  . Multiple Vitamin (MULTI-VITAMIN PO) Take 1 tablet by mouth daily.      No current facility-administered medications for this visit.    Allergies  Allergen Reactions  . Sulfonamide Derivatives Other (See Comments)    Made very sick, per pt   . Penicillins Swelling and Rash    Review of Systems negative except from HPI and PMH  Physical Exam BP 132/74 mmHg  Pulse 75  Ht 5\' 4"  (1.626 m)  Wt 92 lb 6.4 oz (41.912 kg)  BMI 15.85 kg/m2 Well developed and nourished in no acute distress HENT normal Neck supple  Device pocket well healed; without hematoma or erythema.  There is no tethering  Clear Regular rate and rhythm, no  murmurs or gallops Abd-soft with active BS No Clubbing cyanosis edema Skin-warm and dry A & Oriented  Grossly normal sensory and motor function  ECG demonstrates sinus rhythm at 75 Intervals 19/08/38 Otherwise normal Assessment and  Plan  Sinus node dysfunction-intermittent  Pacemaker Medtronic  The patient's device was interrogated.  The information was reviewed. No changes were made in the programming.    otherwise stable

## 2014-10-04 NOTE — Patient Instructions (Signed)
Medication Instructions:  Your physician recommends that you continue on your current medications as directed. Please refer to the Current Medication list given to you today.  Labwork: None ordered  Testing/Procedures: None ordered  Follow-Up: Remote monitoring is used to monitor your Pacemaker of ICD from home. This monitoring reduces the number of office visits required to check your device to one time per year. It allows [place] to keep an eye on the functioning of your device to ensure it is working properly. You are scheduled for a device check from home on 01/03/15. You may send your transmission at any time that day. If you have a wireless device, the transmission will be sent automatically. After your physician reviews your transmission, you will receive a postcard with your next transmission date.  Your physician wants you to follow-up in: 1 year with Dr. Caryl Comes.  You will receive a reminder letter in the mail two months in advance. If you don't receive a letter, please call our office to schedule the follow-up appointment.   Thank you for choosing [facility]!!

## 2014-12-20 ENCOUNTER — Other Ambulatory Visit: Payer: Self-pay | Admitting: Internal Medicine

## 2015-01-03 ENCOUNTER — Ambulatory Visit (INDEPENDENT_AMBULATORY_CARE_PROVIDER_SITE_OTHER): Payer: Medicare PPO | Admitting: *Deleted

## 2015-01-03 ENCOUNTER — Telehealth: Payer: Self-pay | Admitting: Cardiology

## 2015-01-03 DIAGNOSIS — I495 Sick sinus syndrome: Secondary | ICD-10-CM

## 2015-01-03 NOTE — Telephone Encounter (Signed)
Spoke with pt and reminded pt of remote transmission that is due today. Pt verbalized understanding.   

## 2015-01-03 NOTE — Progress Notes (Signed)
Remote pacemaker transmission.   

## 2015-01-06 LAB — CUP PACEART REMOTE DEVICE CHECK
Battery Remaining Longevity: 26 mo
Date Time Interrogation Session: 20160824154636
Lead Channel Pacing Threshold Amplitude: 0.5 V
Lead Channel Pacing Threshold Pulse Width: 0.4 ms
Lead Channel Setting Pacing Amplitude: 2.5 V
MDC IDC MSMT BATTERY IMPEDANCE: 2004 Ohm
MDC IDC MSMT BATTERY VOLTAGE: 2.72 V
MDC IDC MSMT LEADCHNL RA IMPEDANCE VALUE: 553 Ohm
MDC IDC MSMT LEADCHNL RA PACING THRESHOLD AMPLITUDE: 0.875 V
MDC IDC MSMT LEADCHNL RA PACING THRESHOLD PULSEWIDTH: 0.4 ms
MDC IDC MSMT LEADCHNL RA SENSING INTR AMPL: 2.8 mV
MDC IDC MSMT LEADCHNL RV IMPEDANCE VALUE: 654 Ohm
MDC IDC MSMT LEADCHNL RV SENSING INTR AMPL: 11.2 mV
MDC IDC SET LEADCHNL RA PACING AMPLITUDE: 2 V
MDC IDC SET LEADCHNL RV PACING PULSEWIDTH: 0.4 ms
MDC IDC SET LEADCHNL RV SENSING SENSITIVITY: 5.6 mV
MDC IDC STAT BRADY AP VP PERCENT: 0 %
MDC IDC STAT BRADY AP VS PERCENT: 2 %
MDC IDC STAT BRADY AS VP PERCENT: 0 %
MDC IDC STAT BRADY AS VS PERCENT: 97 %

## 2015-01-19 ENCOUNTER — Encounter: Payer: Self-pay | Admitting: Cardiology

## 2015-01-29 DIAGNOSIS — H5211 Myopia, right eye: Secondary | ICD-10-CM | POA: Diagnosis not present

## 2015-01-29 DIAGNOSIS — Z961 Presence of intraocular lens: Secondary | ICD-10-CM | POA: Diagnosis not present

## 2015-01-29 DIAGNOSIS — H1859 Other hereditary corneal dystrophies: Secondary | ICD-10-CM | POA: Diagnosis not present

## 2015-01-29 DIAGNOSIS — H43813 Vitreous degeneration, bilateral: Secondary | ICD-10-CM | POA: Diagnosis not present

## 2015-01-31 ENCOUNTER — Encounter: Payer: Self-pay | Admitting: Internal Medicine

## 2015-04-04 ENCOUNTER — Ambulatory Visit (INDEPENDENT_AMBULATORY_CARE_PROVIDER_SITE_OTHER): Payer: Medicare PPO | Admitting: *Deleted

## 2015-04-04 DIAGNOSIS — I495 Sick sinus syndrome: Secondary | ICD-10-CM

## 2015-04-04 NOTE — Progress Notes (Signed)
Remote pacemaker transmission.   

## 2015-04-16 LAB — CUP PACEART REMOTE DEVICE CHECK
Battery Remaining Longevity: 25 mo
Brady Statistic AP VP Percent: 0 %
Brady Statistic AS VS Percent: 96 %
Date Time Interrogation Session: 20161123143059
Implantable Lead Implant Date: 20080703
Implantable Lead Location: 753860
Implantable Lead Model: 5076
Lead Channel Impedance Value: 572 Ohm
Lead Channel Pacing Threshold Amplitude: 0.5 V
Lead Channel Pacing Threshold Pulse Width: 0.4 ms
Lead Channel Pacing Threshold Pulse Width: 0.4 ms
Lead Channel Setting Pacing Amplitude: 2 V
Lead Channel Setting Pacing Amplitude: 2.5 V
Lead Channel Setting Pacing Pulse Width: 0.4 ms
Lead Channel Setting Sensing Sensitivity: 5.6 mV
MDC IDC LEAD IMPLANT DT: 20080703
MDC IDC LEAD LOCATION: 753859
MDC IDC MSMT BATTERY IMPEDANCE: 2098 Ohm
MDC IDC MSMT BATTERY VOLTAGE: 2.71 V
MDC IDC MSMT LEADCHNL RA PACING THRESHOLD AMPLITUDE: 0.875 V
MDC IDC MSMT LEADCHNL RA SENSING INTR AMPL: 2.8 mV
MDC IDC MSMT LEADCHNL RV IMPEDANCE VALUE: 652 Ohm
MDC IDC MSMT LEADCHNL RV SENSING INTR AMPL: 11.2 mV
MDC IDC STAT BRADY AP VS PERCENT: 3 %
MDC IDC STAT BRADY AS VP PERCENT: 0 %

## 2015-04-17 ENCOUNTER — Encounter: Payer: Self-pay | Admitting: Cardiology

## 2015-07-04 ENCOUNTER — Ambulatory Visit (INDEPENDENT_AMBULATORY_CARE_PROVIDER_SITE_OTHER): Payer: Medicare Other | Admitting: *Deleted

## 2015-07-04 DIAGNOSIS — I495 Sick sinus syndrome: Secondary | ICD-10-CM | POA: Diagnosis not present

## 2015-07-04 NOTE — Progress Notes (Signed)
Remote pacemaker transmission.   

## 2015-07-31 LAB — CUP PACEART REMOTE DEVICE CHECK
Brady Statistic AP VP Percent: 0 %
Brady Statistic AS VS Percent: 95 %
Date Time Interrogation Session: 20170222140403
Implantable Lead Location: 753859
Implantable Lead Model: 5076
Lead Channel Pacing Threshold Amplitude: 0.5 V
Lead Channel Pacing Threshold Amplitude: 1 V
Lead Channel Pacing Threshold Pulse Width: 0.4 ms
Lead Channel Sensing Intrinsic Amplitude: 11.2 mV
Lead Channel Setting Pacing Amplitude: 2 V
Lead Channel Setting Pacing Pulse Width: 0.4 ms
Lead Channel Setting Sensing Sensitivity: 4 mV
MDC IDC LEAD IMPLANT DT: 20080703
MDC IDC LEAD IMPLANT DT: 20080703
MDC IDC LEAD LOCATION: 753860
MDC IDC MSMT BATTERY IMPEDANCE: 2318 Ohm
MDC IDC MSMT BATTERY REMAINING LONGEVITY: 23 mo
MDC IDC MSMT BATTERY VOLTAGE: 2.72 V
MDC IDC MSMT LEADCHNL RA IMPEDANCE VALUE: 549 Ohm
MDC IDC MSMT LEADCHNL RA PACING THRESHOLD PULSEWIDTH: 0.4 ms
MDC IDC MSMT LEADCHNL RA SENSING INTR AMPL: 1.4 mV
MDC IDC MSMT LEADCHNL RV IMPEDANCE VALUE: 568 Ohm
MDC IDC SET LEADCHNL RV PACING AMPLITUDE: 2.5 V
MDC IDC STAT BRADY AP VS PERCENT: 4 %
MDC IDC STAT BRADY AS VP PERCENT: 0 %

## 2015-08-01 ENCOUNTER — Encounter: Payer: Self-pay | Admitting: Cardiology

## 2015-08-27 ENCOUNTER — Other Ambulatory Visit: Payer: Medicare Other

## 2015-08-27 DIAGNOSIS — E7800 Pure hypercholesterolemia, unspecified: Secondary | ICD-10-CM

## 2015-08-27 DIAGNOSIS — E875 Hyperkalemia: Secondary | ICD-10-CM

## 2015-08-27 DIAGNOSIS — R634 Abnormal weight loss: Secondary | ICD-10-CM

## 2015-08-28 LAB — COMPREHENSIVE METABOLIC PANEL
ALT: 14 IU/L (ref 0–32)
AST: 18 IU/L (ref 0–40)
Albumin/Globulin Ratio: 1.4 (ref 1.2–2.2)
Albumin: 4.1 g/dL (ref 3.5–4.7)
Alkaline Phosphatase: 75 IU/L (ref 39–117)
BUN/Creatinine Ratio: 19 (ref 12–28)
BUN: 15 mg/dL (ref 8–27)
Bilirubin Total: 0.5 mg/dL (ref 0.0–1.2)
CO2: 22 mmol/L (ref 18–29)
Calcium: 9.2 mg/dL (ref 8.7–10.3)
Chloride: 101 mmol/L (ref 96–106)
Creatinine, Ser: 0.77 mg/dL (ref 0.57–1.00)
GFR calc Af Amer: 83 mL/min/{1.73_m2} (ref >59)
GFR calc non Af Amer: 72 mL/min/{1.73_m2} (ref >59)
Globulin, Total: 2.9 g/dL (ref 1.5–4.5)
Glucose: 87 mg/dL (ref 65–99)
Potassium: 3.6 mmol/L (ref 3.5–5.2)
Sodium: 143 mmol/L (ref 134–144)
Total Protein: 7 g/dL (ref 6.0–8.5)

## 2015-08-28 LAB — CBC WITH DIFFERENTIAL/PLATELET
Basophils Absolute: 0.1 10*3/uL (ref 0.0–0.2)
Basos: 1 %
EOS (ABSOLUTE): 0.1 10*3/uL (ref 0.0–0.4)
Eos: 2 %
Hematocrit: 46.5 % (ref 34.0–46.6)
Hemoglobin: 14.8 g/dL (ref 11.1–15.9)
Immature Grans (Abs): 0 10*3/uL (ref 0.0–0.1)
Immature Granulocytes: 0 %
Lymphocytes Absolute: 1 10*3/uL (ref 0.7–3.1)
Lymphs: 14 %
MCH: 29.4 pg (ref 26.6–33.0)
MCHC: 31.8 g/dL (ref 31.5–35.7)
MCV: 92 fL (ref 79–97)
Monocytes Absolute: 0.8 10*3/uL (ref 0.1–0.9)
Monocytes: 11 %
Neutrophils Absolute: 5.1 10*3/uL (ref 1.4–7.0)
Neutrophils: 72 %
Platelets: 267 10*3/uL (ref 150–379)
RBC: 5.04 x10E6/uL (ref 3.77–5.28)
RDW: 14.3 % (ref 12.3–15.4)
WBC: 7.1 10*3/uL (ref 3.4–10.8)

## 2015-08-28 LAB — LIPID PANEL
Chol/HDL Ratio: 3.2 ratio units (ref 0.0–4.4)
Cholesterol, Total: 217 mg/dL — ABNORMAL HIGH (ref 100–199)
HDL: 68 mg/dL (ref >39)
LDL Calculated: 128 mg/dL — ABNORMAL HIGH (ref 0–99)
Triglycerides: 107 mg/dL (ref 0–149)
VLDL Cholesterol Cal: 21 mg/dL (ref 5–40)

## 2015-08-31 ENCOUNTER — Encounter: Payer: Self-pay | Admitting: Internal Medicine

## 2015-08-31 ENCOUNTER — Ambulatory Visit (INDEPENDENT_AMBULATORY_CARE_PROVIDER_SITE_OTHER): Payer: Medicare Other | Admitting: Internal Medicine

## 2015-08-31 VITALS — BP 132/82 | HR 77 | Temp 97.7°F | Ht 63.5 in | Wt 91.0 lb

## 2015-08-31 DIAGNOSIS — Z950 Presence of cardiac pacemaker: Secondary | ICD-10-CM | POA: Diagnosis not present

## 2015-08-31 DIAGNOSIS — M545 Low back pain, unspecified: Secondary | ICD-10-CM

## 2015-08-31 DIAGNOSIS — Z0000 Encounter for general adult medical examination without abnormal findings: Secondary | ICD-10-CM | POA: Diagnosis not present

## 2015-08-31 DIAGNOSIS — I495 Sick sinus syndrome: Secondary | ICD-10-CM | POA: Diagnosis not present

## 2015-08-31 DIAGNOSIS — I10 Essential (primary) hypertension: Secondary | ICD-10-CM

## 2015-08-31 DIAGNOSIS — M810 Age-related osteoporosis without current pathological fracture: Secondary | ICD-10-CM

## 2015-08-31 MED ORDER — DICLOFENAC SODIUM 1 % TD GEL
4.0000 g | Freq: Four times a day (QID) | TRANSDERMAL | Status: DC
Start: 2015-08-31 — End: 2017-02-24

## 2015-08-31 NOTE — Progress Notes (Signed)
Patient ID: Alexandria Wright, female   DOB: 1930/02/04, 82 y.o.   MRN: [id number] MMSE 30/30 passed clock drawing

## 2015-08-31 NOTE — Progress Notes (Signed)
Patient ID: Alexandria Wright, female   DOB: 01/14/1930, 82 y.o.   MRN: [id number]   Location:  [facility] clinic Provider: Kla Bily L. Mariea Clonts, D.O., C.M.D.  Patient Care Team: Gayland Curry, DO as PCP - General (Geriatric Medicine) Marygrace Drought, MD as Consulting Physician (Ophthalmology)  Extended Emergency Contact Information Primary Emergency Contact: Selinda Eon,  Home Phone: [phone] Relation: None  Code Status: DNR Goals of Care: Advanced Directive information Advanced Directives 08/31/2015  Does patient have an advance directive? Yes  Type of Advance Directive Living will  Does patient want to make changes to advanced directive? -  Copy of advanced directive(s) in chart? Yes     Chief Complaint  Patient presents with  . Annual Exam    Wellness exam  . Medical Management of Chronic Issues    medication management, blood pressure, osteoporosis  . MMSE    30/30 passed clock drawing    HPI: Patient is a 82 y.o. female seen in today for an annual wellness exam and medical mgt of chronic diseases including her htn, osteoporosis.  Lower back pain every now and then, but no other difficulty.  Rubs ointments and creams on there.  Nothing works great.  She would like to try a prescription version to see if it's better.    Depression screen [facility] 2/9 08/31/2015 08/25/2014 08/22/2013  Decreased Interest 0 0 0  Down, Depressed, Hopeless 0 0 0  PHQ - 2 Score 0 0 0    Fall Risk  08/31/2015 08/25/2014 08/22/2013  Falls in the past year? No No No   MMSE - Mini Mental State Exam 08/31/2015 08/25/2014 08/22/2013  Orientation to time 5 5 5   Orientation to Place 5 5 5   Registration 3 3 3   Attention/ Calculation 5 5 5   Recall 3 2 2   Language- name 2 objects 2 2 2   Language- repeat 1 1 1   Language- follow 3 step command 3 3 3   Language- read & follow direction 1 1 1   Write a sentence 1 1 1   Copy design 1 1 1   Total score 30 29 29   passed clock  Health Maintenance   Topic Date Due  . INFLUENZA VACCINE  12/11/2015  . TETANUS/TDAP  07/10/2021  . DEXA SCAN  Completed  . ZOSTAVAX  Completed  . PNA vac Low Risk Adult  Completed   Urinary incontinence?  No difficulty Functional Status Survey: Is the patient deaf or have difficulty hearing?: Yes Does the patient have difficulty seeing, even when wearing glasses/contacts?: No Does the patient have difficulty concentrating, remembering, or making decisions?: No Does the patient have difficulty walking or climbing stairs?: No Does the patient have difficulty dressing or bathing?: No Does the patient have difficulty doing errands alone such as visiting a doctor's office or shopping?: No Exercise?  Walking 8 miles a day 5 days a week. Diet?  Does not eat the healthiest diet--still does not really have much appetite.  Has to force herself to drink more water even when she walks--does make a point to drink something.    Visual Acuity Screening   Right eye Left eye Both eyes  Without correction:     With correction: 20/70 20/20 20/20   Has right eye for close and left for far.  Sees ophtho annually.  Hearing:  Sometimes she can pull on her ear and hear better.  Some minor hearing loss, but not pronounced Dentition:  No difficulty.  Goes every 6 mos to dentist--just went a few weeks ago. Pain:  Back hurts at times.    Past Medical History  Diagnosis Date  . Syncope   . Tachycardia     asyptomatic nonsustained  . Bradycardia     symptomatic  . GERD (gastroesophageal reflux disease)   . Esophageal stricture   . Skin cancer     Past Surgical History  Procedure Laterality Date  . Pacemaker placement  02-Sep-2006  . Tonsillectomy and adenoidectomy    . Skin cancer excision    . Cataract extraction  12/10/2011    right   . Cataract extraction  12/17/2011    left    Social History   Social History  . Marital Status: Widowed    Spouse Name: N/A  . Number of Children: 0  . Years of Education: N/A    Occupational History  . retired Pharmacist, hospital    Social History Main Topics  . Smoking status: Never Smoker   . Smokeless tobacco: Never Used  . Alcohol Use: No  . Drug Use: No  . Sexual Activity: Not on file   Other Topics Concern  . Not on file   Social History Narrative   Widow -husband died 2003-09-02   Lives alone   Never smoked   Alcohol none   Walks 8 miles daily 5 days a week    Living Will       Allergies  Allergen Reactions  . Sulfonamide Derivatives Other (See Comments)    Made very sick, per pt   . Penicillins Swelling and Rash      Medication List       This list is accurate as of: 08/31/15  9:23 AM.  Always use your most recent med list.               metoprolol succinate 25 MG 24 hr tablet  Commonly known as:  TOPROL-XL  TAKE ONE TABLET BY MOUTH ONE TIME DAILY     MULTI-VITAMIN PO  Take 1 tablet by mouth daily.     OSCAL 500/200 D-3 PO  Take 1 tablet by mouth daily.     VITAMIN D PO  Take 2,000 Units by mouth daily.       Review of Systems:  Review of Systems  Constitutional: Negative for fever and chills.  HENT: Negative for congestion and hearing loss.   Eyes: Negative for blurred vision.  Respiratory: Negative for cough and shortness of breath.   Cardiovascular: Negative for chest pain and leg swelling.  Gastrointestinal: Negative for abdominal pain, constipation, blood in stool and melena.  Genitourinary: Negative for dysuria, urgency and frequency.  Musculoskeletal: Positive for back pain. Negative for falls.  Skin: Negative for rash.  Neurological: Negative for dizziness, loss of consciousness, weakness and headaches.  Psychiatric/Behavioral: Negative for depression and memory loss. The patient is not nervous/anxious and does not have insomnia.     Physical Exam: Filed Vitals:   08/31/15 0852  BP: 132/82  Pulse: 77  Temp: 97.7 F (36.5 C)  TempSrc: Oral  Height: 5' 3.5" (1.613 m)  Weight: 91 lb (41.277 kg)  SpO2: 92%    Body mass index is 15.86 kg/(m^2). Physical Exam  Constitutional: She is oriented to person, place, and time. No distress.  Thin white female in nad  HENT:  Head: Normocephalic and atraumatic.  Right Ear: External ear normal.  Left Ear: External ear normal.  Nose: Nose normal.  Mouth/Throat: Oropharynx is clear and moist.  No oropharyngeal exudate.  Some cerumen in each ear, but not impacted  Eyes: Conjunctivae and EOM are normal. Pupils are equal, round, and reactive to light.  Neck: Normal range of motion. Neck supple. No JVD present.  Cardiovascular: Normal rate, regular rhythm, normal heart sounds and intact distal pulses.   Pulmonary/Chest: Effort normal and breath sounds normal. No respiratory distress. Right breast exhibits no inverted nipple, no mass, no nipple discharge, no skin change and no tenderness. Left breast exhibits no inverted nipple, no mass, no nipple discharge, no skin change and no tenderness.  Pacemaker in place  Abdominal: Soft. Bowel sounds are normal. She exhibits no distension and no mass. There is no tenderness. There is no rebound and no guarding.  Musculoskeletal: Normal range of motion. She exhibits no edema or tenderness.  Lymphadenopathy:    She has no cervical adenopathy.  Neurological: She is alert and oriented to person, place, and time. She has normal reflexes. No cranial nerve deficit.  Skin: Skin is warm and dry.  Psychiatric: She has a normal mood and affect. Her behavior is normal. Judgment and thought content normal.    Labs reviewed: Basic Metabolic Panel:  Recent Labs  08/27/15 0822  NA 143  K 3.6  CL 101  CO2 22  GLUCOSE 87  BUN 15  CREATININE 0.77  CALCIUM 9.2   Liver Function Tests:  Recent Labs  08/27/15 0822  AST 18  ALT 14  ALKPHOS 75  BILITOT 0.5  PROT 7.0  ALBUMIN 4.1   No results for input(s): LIPASE, AMYLASE in the last 8760 hours. No results for input(s): AMMONIA in the last 8760 hours. CBC:  Recent  Labs  08/27/15 0822  WBC 7.1  NEUTROABS 5.1  HCT 46.5  MCV 92  PLT 267   Lipid Panel:  Recent Labs  08/27/15 0822  CHOL 217*  HDL 68  LDLCALC 128*  TRIG 107  CHOLHDL 3.2   No results found for: HGBA1C  Procedures: Sees Dr. Wanita Chamberlain instead as Dr. Caryl Comes was specializing now.  Assessment/Plan 1. Medicare annual wellness visit, subsequent -doing great -is up to date and won't do anything different about a bone density if she gets one so no point -f/u labs in a year -sees ophtho, dentist, cardiologist regularly also  2. Midline low back pain without sciatica - using otc creams and they are no longer helpful -cannot take nsaids due to gi bleed risks  - diclofenac sodium (VOLTAREN) 1 % GEL; Apply 4 g topically 4 (four) times daily.  Dispense: 100 g; Refill: 3  3. Sinoatrial node dysfunction (HCC) -cont f/u with cardiology, doing well with pacemaker  4. Essential hypertension, benign -bp at goal with metoprolol succinate only  5. S/P placement of cardiac pacemaker -following with cardiology--will be seeing someone new  6. Senile osteoporosis -won't take other meds so cont ca with D and weightbearing and no further bone density   Labs/tests ordered:   Orders Placed This Encounter  Procedures  . CBC with Differential/Platelet    Standing Status: Future     Number of Occurrences:      Standing Expiration Date: 08/30/2017  . Comprehensive metabolic panel    Standing Status: Future     Number of Occurrences:      Standing Expiration Date: 08/30/2017    Order Specific Question:  Has the patient fasted?    Answer:  Yes  . Lipid panel    Standing Status: Future     Number of Occurrences:  Standing Expiration Date: 08/30/2017    Order Specific Question:  Has the patient fasted?    Answer:  Yes   Next appt:  1 year annual exam with labs before  Mildred Bollard L. Giordana Weinheimer, D.O. [facility] [address], [place]  [address] Cell Phone (Mon-Fri 8am-5pm):  [phone] On Call:  [phone] & follow prompts after 5pm & weekends Office Phone:  [phone] Office Fax:  [phone]

## 2015-09-03 ENCOUNTER — Telehealth: Payer: Self-pay

## 2015-09-03 NOTE — Telephone Encounter (Signed)
PA for Diclofenac Sodium 1 % received via fax from [facility]. I called [phone] to initiate PA.  Member ID [id number]   PA approved through 05/11/16, letter will be mailed with confirmation. Approval # [id number]. I called the pharmacy and informed them medication was approved, ok to run rx again and inform patient when rx is ready for pick-up

## 2015-09-06 ENCOUNTER — Encounter: Payer: Self-pay | Admitting: Gastroenterology

## 2015-10-03 ENCOUNTER — Encounter: Payer: Self-pay | Admitting: Nurse Practitioner

## 2015-10-03 NOTE — Progress Notes (Signed)
Electrophysiology Office Note Date: 10/04/2015  ID:  Alexandria Wright, DOB May 25, 1929, MRN [id number]  PCP: Hollace Kinnier, DO Electrophysiologist: Caryl Comes  CC: Pacemaker follow-up  Alexandria Wright is a 82 y.o. female seen today for Dr Caryl Comes.  She presents today for routine electrophysiology followup.  Since last being seen in our clinic, the patient reports doing very well.  She is walking 8 miles 5 days a week.  She denies chest pain, palpitations, dyspnea, PND, orthopnea, nausea, vomiting, dizziness, syncope, edema, weight gain, or early satiety.  Device History: MDT dual chamber PPM implanted 08/14/06 for symptomatic bradycardia    Past Medical History  Diagnosis Date  . Syncope   . Tachycardia     asyptomatic nonsustained  . Bradycardia     a. s/p MDT dual chamber PPM   . GERD (gastroesophageal reflux disease)   . Esophageal stricture   . Skin cancer    Past Surgical History  Procedure Laterality Date  . Pacemaker placement  14-Aug-2006    MDT dual chamber PPM implanted by Dr Caryl Comes for symptomatic bradycardia  . Tonsillectomy and adenoidectomy    . Skin cancer excision    . Cataract extraction  12/10/2011    right   . Cataract extraction  12/17/2011    left    Current Outpatient Prescriptions  Medication Sig Dispense Refill  . Calcium Carbonate-Vitamin D (OSCAL 500/200 D-3 PO) Take 1 tablet by mouth daily.     . Cholecalciferol (VITAMIN D PO) Take 2,000 Units by mouth daily.    . diclofenac sodium (VOLTAREN) 1 % GEL Apply 4 g topically 4 (four) times daily. 100 g 3  . metoprolol succinate (TOPROL-XL) 25 MG 24 hr tablet TAKE ONE TABLET BY MOUTH ONE TIME DAILY 90 tablet 3  . Multiple Vitamin (MULTI-VITAMIN PO) Take 1 tablet by mouth daily.      No current facility-administered medications for this visit.    Allergies:   Sulfonamide derivatives and Penicillins   Social History: Social History   Social History  . Marital Status: Widowed    Spouse Name: N/A  . Number of  Children: 0  . Years of Education: N/A   Occupational History  . retired Pharmacist, hospital    Social History Main Topics  . Smoking status: Never Smoker   . Smokeless tobacco: Never Used  . Alcohol Use: No  . Drug Use: No  . Sexual Activity: Not on file   Other Topics Concern  . Not on file   Social History Narrative   Widow -husband died Aug 14, 2003   Lives alone   Never smoked   Alcohol none   Walks 8 miles daily 5 days a week    Living Will       Family History: Family History  Problem Relation Age of Onset  . Heart disease Mother   . Colon cancer Neg Hx      Review of Systems: All other systems reviewed and are otherwise negative except as noted above.   Physical Exam: VS:  BP 136/88 mmHg  Pulse 71  Ht 5\' 4"  (1.626 m)  Wt 90 lb 12.8 oz (41.187 kg)  BMI 15.58 kg/m2 , BMI Body mass index is 15.58 kg/(m^2).  GEN- The patient is elderly and thin appearing, alert and oriented x 3 today.   HEENT: normocephalic, atraumatic; sclera clear, conjunctiva pink; hearing intact; oropharynx clear; neck supple  Lungs- Clear to ausculation bilaterally, normal work of breathing.  No wheezes, rales, rhonchi Heart- Regular rate  and rhythm  GI- soft, non-tender, non-distended, bowel sounds present  Extremities- no clubbing, cyanosis, or edema; DP/PT/radial pulses 2+ bilaterally MS- no significant deformity or atrophy Skin- warm and dry, no rash or lesion; PPM pocket well healed - prominent pacemaker 2/2 lack of adipose tissue but no skin breakdown Psych- euthymic mood, full affect Neuro- strength and sensation are intact  PPM Interrogation- reviewed in detail today,  See PACEART report  EKG:  EKG is ordered today. The ekg ordered today shows sinus rhythm rate 71  Recent Labs: 08/27/2015: ALT 14; BUN 15; Creatinine, Ser 0.77; Platelets 267; Potassium 3.6; Sodium 143   Wt Readings from Last 3 Encounters:  10/04/15 90 lb 12.8 oz (41.187 kg)  08/31/15 91 lb (41.277 kg)  10/04/14 92 lb 6.4  oz (41.912 kg)     Other studies Reviewed: Additional studies/ records that were reviewed today include: Dr Olin Pia office notes  Assessment and Plan:  1.  Symptomatic bradycardia Normal PPM function See Pace Art report No changes today   Current medicines are reviewed at length with the patient today.   The patient does not have concerns regarding her medicines.  The following changes were made today:  none  Labs/ tests ordered today include: none   Disposition:   Follow up with [facility], Dr Caryl Comes 1 year      Signed, Chanetta Marshall, NP 10/04/2015 8:25 AM  [facility] [address] [place] [address] [phone] (office) [phone] (fax)

## 2015-10-04 ENCOUNTER — Encounter: Payer: Self-pay | Admitting: Internal Medicine

## 2015-10-04 ENCOUNTER — Ambulatory Visit (INDEPENDENT_AMBULATORY_CARE_PROVIDER_SITE_OTHER): Payer: Medicare Other | Admitting: Nurse Practitioner

## 2015-10-04 ENCOUNTER — Encounter: Payer: Self-pay | Admitting: Nurse Practitioner

## 2015-10-04 VITALS — BP 136/88 | HR 71 | Ht 64.0 in | Wt 90.8 lb

## 2015-10-04 DIAGNOSIS — R001 Bradycardia, unspecified: Secondary | ICD-10-CM | POA: Diagnosis not present

## 2015-10-04 LAB — CUP PACEART INCLINIC DEVICE CHECK
Battery Impedance: 2715 Ohm
Brady Statistic AP VS Percent: 5 %
Brady Statistic AS VS Percent: 94 %
Date Time Interrogation Session: 20170525082917
Implantable Lead Implant Date: 20080703
Implantable Lead Location: 753859
Lead Channel Impedance Value: 636 Ohm
Lead Channel Pacing Threshold Amplitude: 1.25 V
Lead Channel Sensing Intrinsic Amplitude: 11.2 mV
Lead Channel Setting Pacing Amplitude: 2 V
Lead Channel Setting Pacing Pulse Width: 0.4 ms
Lead Channel Setting Sensing Sensitivity: 5.6 mV
MDC IDC LEAD IMPLANT DT: 20080703
MDC IDC LEAD LOCATION: 753860
MDC IDC MSMT BATTERY REMAINING LONGEVITY: 19 mo
MDC IDC MSMT BATTERY VOLTAGE: 2.71 V
MDC IDC MSMT LEADCHNL RA IMPEDANCE VALUE: 581 Ohm
MDC IDC MSMT LEADCHNL RA PACING THRESHOLD AMPLITUDE: 0.5 V
MDC IDC MSMT LEADCHNL RA PACING THRESHOLD PULSEWIDTH: 0.4 ms
MDC IDC MSMT LEADCHNL RA SENSING INTR AMPL: 4 mV
MDC IDC MSMT LEADCHNL RV PACING THRESHOLD PULSEWIDTH: 0.4 ms
MDC IDC SET LEADCHNL RV PACING AMPLITUDE: 2.5 V
MDC IDC STAT BRADY AP VP PERCENT: 0 %
MDC IDC STAT BRADY AS VP PERCENT: 0 %

## 2015-10-04 NOTE — Patient Instructions (Addendum)
Medication Instructions:   Your physician recommends that you continue on your current medications as directed. Please refer to the Current Medication list given to you today.   If you need a refill on your cardiac medications before your next appointment, please call your pharmacy.  Labwork: NONE ORDER TODAY   Testing/Procedures: NONE ORDER TODAY    Follow-Up:  Remote monitoring is used to monitor your Pacemaker of ICD from home. This monitoring reduces the number of office visits required to check your device to one time per year. It allows [place] to keep an eye on the functioning of your device to ensure it is working properly. You are scheduled for a device check from home on . 01/03/16..You may send your transmission at any time that day. If you have a wireless device, the transmission will be sent automatically. After your physician reviews your transmission, you will receive a postcard with your next transmission date.  Your physician wants you to follow-up in: [place] will receive a reminder letter in the mail two months in advance. If you don't receive a letter, please call our office to schedule the follow-up appointment.     Any Other Special Instructions Will Be Listed Below (If Applicable).

## 2015-12-14 ENCOUNTER — Other Ambulatory Visit: Payer: Self-pay | Admitting: Internal Medicine

## 2016-01-03 ENCOUNTER — Ambulatory Visit (INDEPENDENT_AMBULATORY_CARE_PROVIDER_SITE_OTHER): Payer: Medicare Other | Admitting: *Deleted

## 2016-01-03 DIAGNOSIS — I495 Sick sinus syndrome: Secondary | ICD-10-CM

## 2016-01-03 NOTE — Progress Notes (Signed)
Remote pacemaker transmission.   

## 2016-01-10 ENCOUNTER — Encounter: Payer: Self-pay | Admitting: Cardiology

## 2016-01-16 LAB — CUP PACEART REMOTE DEVICE CHECK
Battery Remaining Longevity: 16 mo
Battery Voltage: 2.7 V
Brady Statistic AP VP Percent: 0 %
Brady Statistic AP VS Percent: 3 %
Brady Statistic AS VP Percent: 1 %
Date Time Interrogation Session: 20170824123217
Implantable Lead Location: 753859
Implantable Lead Location: 753860
Implantable Lead Model: 5076
Lead Channel Impedance Value: 634 Ohm
Lead Channel Pacing Threshold Amplitude: 0.75 V
Lead Channel Pacing Threshold Pulse Width: 0.4 ms
Lead Channel Pacing Threshold Pulse Width: 0.4 ms
Lead Channel Sensing Intrinsic Amplitude: 1 mV
Lead Channel Setting Pacing Pulse Width: 0.4 ms
MDC IDC LEAD IMPLANT DT: 20080703
MDC IDC LEAD IMPLANT DT: 20080703
MDC IDC MSMT BATTERY IMPEDANCE: 3115 Ohm
MDC IDC MSMT LEADCHNL RA IMPEDANCE VALUE: 589 Ohm
MDC IDC MSMT LEADCHNL RA PACING THRESHOLD AMPLITUDE: 0.625 V
MDC IDC MSMT LEADCHNL RV SENSING INTR AMPL: 11.2 mV
MDC IDC SET LEADCHNL RA PACING AMPLITUDE: 2 V
MDC IDC SET LEADCHNL RV PACING AMPLITUDE: 2.5 V
MDC IDC SET LEADCHNL RV SENSING SENSITIVITY: 5.6 mV
MDC IDC STAT BRADY AS VS PERCENT: 96 %

## 2016-04-10 ENCOUNTER — Ambulatory Visit (INDEPENDENT_AMBULATORY_CARE_PROVIDER_SITE_OTHER): Payer: Medicare Other | Admitting: *Deleted

## 2016-04-10 ENCOUNTER — Telehealth: Payer: Self-pay | Admitting: Cardiology

## 2016-04-10 DIAGNOSIS — I495 Sick sinus syndrome: Secondary | ICD-10-CM

## 2016-04-10 NOTE — Progress Notes (Signed)
Remote pacemaker transmission.   

## 2016-04-10 NOTE — Telephone Encounter (Signed)
New Message:    Pt says she was transmitting and it stopped,wants to know if transmission was completed?

## 2016-04-10 NOTE — Telephone Encounter (Signed)
Pt called back. I informed her that her remote transmission was received.

## 2016-04-18 LAB — CUP PACEART REMOTE DEVICE CHECK
Battery Impedance: 3468 Ohm
Battery Remaining Longevity: 13 mo
Battery Voltage: 2.69 V
Brady Statistic AP VP Percent: 0 %
Implantable Lead Implant Date: 20080703
Implantable Lead Location: 753860
Implantable Lead Model: 5076
Implantable Pulse Generator Implant Date: 20080703
Lead Channel Impedance Value: 585 Ohm
Lead Channel Impedance Value: 613 Ohm
Lead Channel Setting Pacing Amplitude: 2 V
Lead Channel Setting Pacing Amplitude: 2.5 V
Lead Channel Setting Pacing Pulse Width: 0.4 ms
MDC IDC LEAD IMPLANT DT: 20080703
MDC IDC LEAD LOCATION: 753859
MDC IDC MSMT LEADCHNL RA PACING THRESHOLD AMPLITUDE: 0.75 V
MDC IDC MSMT LEADCHNL RA PACING THRESHOLD PULSEWIDTH: 0.4 ms
MDC IDC MSMT LEADCHNL RV PACING THRESHOLD AMPLITUDE: 0.5 V
MDC IDC MSMT LEADCHNL RV PACING THRESHOLD PULSEWIDTH: 0.4 ms
MDC IDC SESS DTM: 20171130172047
MDC IDC SET LEADCHNL RV SENSING SENSITIVITY: 5.6 mV
MDC IDC STAT BRADY AP VS PERCENT: 3 %
MDC IDC STAT BRADY AS VP PERCENT: 1 %
MDC IDC STAT BRADY AS VS PERCENT: 96 %

## 2016-04-22 ENCOUNTER — Encounter: Payer: Self-pay | Admitting: Cardiology

## 2016-05-15 ENCOUNTER — Other Ambulatory Visit: Payer: Self-pay | Admitting: Internal Medicine

## 2016-07-10 ENCOUNTER — Telehealth: Payer: Self-pay

## 2016-07-10 ENCOUNTER — Ambulatory Visit (INDEPENDENT_AMBULATORY_CARE_PROVIDER_SITE_OTHER): Payer: Medicare Other | Admitting: *Deleted

## 2016-07-10 DIAGNOSIS — I495 Sick sinus syndrome: Secondary | ICD-10-CM | POA: Diagnosis not present

## 2016-07-10 NOTE — Progress Notes (Signed)
Remote pacemaker transmission.   

## 2016-07-10 NOTE — Telephone Encounter (Signed)
Incoming fax received from [place] to initiate PA for Diclofenac Sodium 1 % Gel.  Prior Authorization was completed via Covermymeds Case ID: [id number] Key [id number]  DX: M54.4  Medication was approved thourgh 05/11/17, faxed approval letter to pharmacy.

## 2016-07-15 ENCOUNTER — Encounter: Payer: Self-pay | Admitting: Cardiology

## 2016-07-15 LAB — CUP PACEART REMOTE DEVICE CHECK
Battery Impedance: 3796 Ohm
Battery Remaining Longevity: 11 mo
Battery Voltage: 2.67 V
Brady Statistic AP VP Percent: 0 %
Brady Statistic AP VS Percent: 6 %
Brady Statistic AS VP Percent: 1 %
Brady Statistic AS VS Percent: 93 %
Date Time Interrogation Session: 20180301141306
Implantable Lead Implant Date: 20080703
Implantable Lead Implant Date: 20080703
Implantable Lead Location: 753859
Implantable Lead Location: 753860
Implantable Lead Model: 5076
Implantable Lead Model: 5076
Implantable Pulse Generator Implant Date: 20080703
Lead Channel Impedance Value: 588 Ohm
Lead Channel Impedance Value: 635 Ohm
Lead Channel Pacing Threshold Amplitude: 0.5 V
Lead Channel Pacing Threshold Amplitude: 0.75 V
Lead Channel Pacing Threshold Pulse Width: 0.4 ms
Lead Channel Pacing Threshold Pulse Width: 0.4 ms
Lead Channel Setting Pacing Amplitude: 2 V
Lead Channel Setting Pacing Amplitude: 2.5 V
Lead Channel Setting Pacing Pulse Width: 0.4 ms
Lead Channel Setting Sensing Sensitivity: 5.6 mV

## 2016-08-14 ENCOUNTER — Other Ambulatory Visit: Payer: Self-pay | Admitting: Internal Medicine

## 2016-08-14 DIAGNOSIS — G8929 Other chronic pain: Secondary | ICD-10-CM

## 2016-08-14 DIAGNOSIS — K222 Esophageal obstruction: Secondary | ICD-10-CM

## 2016-08-14 DIAGNOSIS — M545 Low back pain, unspecified: Secondary | ICD-10-CM

## 2016-08-14 DIAGNOSIS — M810 Age-related osteoporosis without current pathological fracture: Secondary | ICD-10-CM

## 2016-08-14 DIAGNOSIS — I10 Essential (primary) hypertension: Secondary | ICD-10-CM

## 2016-09-01 ENCOUNTER — Ambulatory Visit (INDEPENDENT_AMBULATORY_CARE_PROVIDER_SITE_OTHER): Payer: Medicare Other

## 2016-09-01 ENCOUNTER — Other Ambulatory Visit: Payer: Self-pay

## 2016-09-01 ENCOUNTER — Other Ambulatory Visit: Payer: Medicare Other

## 2016-09-01 ENCOUNTER — Ambulatory Visit: Payer: Self-pay

## 2016-09-01 VITALS — BP 135/70 | HR 80 | Ht 64.0 in | Wt 90.0 lb

## 2016-09-01 DIAGNOSIS — M545 Low back pain, unspecified: Secondary | ICD-10-CM

## 2016-09-01 DIAGNOSIS — M810 Age-related osteoporosis without current pathological fracture: Secondary | ICD-10-CM

## 2016-09-01 DIAGNOSIS — I10 Essential (primary) hypertension: Secondary | ICD-10-CM

## 2016-09-01 DIAGNOSIS — Z0000 Encounter for general adult medical examination without abnormal findings: Secondary | ICD-10-CM

## 2016-09-01 DIAGNOSIS — K222 Esophageal obstruction: Secondary | ICD-10-CM

## 2016-09-01 DIAGNOSIS — G8929 Other chronic pain: Secondary | ICD-10-CM

## 2016-09-01 LAB — CBC WITH DIFFERENTIAL/PLATELET
Basophils Absolute: 0 cells/uL (ref 0–200)
Basophils Relative: 0 %
Eosinophils Absolute: 69 cells/uL (ref 15–500)
Eosinophils Relative: 1 %
HCT: 45.1 % — ABNORMAL HIGH (ref 35.0–45.0)
Hemoglobin: 14.9 g/dL (ref 11.7–15.5)
Lymphocytes Relative: 11 %
Lymphs Abs: 759 cells/uL — ABNORMAL LOW (ref 850–3900)
MCH: 30.5 pg (ref 27.0–33.0)
MCHC: 33 g/dL (ref 32.0–36.0)
MCV: 92.2 fL (ref 80.0–100.0)
MPV: 9.9 fL (ref 7.5–12.5)
Monocytes Absolute: 759 cells/uL (ref 200–950)
Monocytes Relative: 11 %
Neutro Abs: 5313 cells/uL (ref 1500–7800)
Neutrophils Relative %: 77 %
Platelets: 240 10*3/uL (ref 140–400)
RBC: 4.89 MIL/uL (ref 3.80–5.10)
RDW: 13.9 % (ref 11.0–15.0)
WBC: 6.9 10*3/uL (ref 3.8–10.8)

## 2016-09-01 LAB — COMPLETE METABOLIC PANEL WITH GFR
ALT: 6 U/L (ref 6–29)
AST: 12 U/L (ref 10–35)
Albumin: 4 g/dL (ref 3.6–5.1)
Alkaline Phosphatase: 69 U/L (ref 33–130)
BUN: 17 mg/dL (ref 7–25)
CO2: 28 mmol/L (ref 20–31)
Calcium: 9.2 mg/dL (ref 8.6–10.4)
Chloride: 100 mmol/L (ref 98–110)
Creat: 0.79 mg/dL (ref 0.60–0.88)
GFR, Est African American: 79 mL/min (ref >=60)
GFR, Est Non African American: 69 mL/min (ref >=60)
Glucose, Bld: 104 mg/dL — ABNORMAL HIGH (ref 65–99)
Potassium: 4.1 mmol/L (ref 3.5–5.3)
Sodium: 137 mmol/L (ref 135–146)
Total Bilirubin: 0.7 mg/dL (ref 0.2–1.2)
Total Protein: 6.9 g/dL (ref 6.1–8.1)

## 2016-09-01 LAB — LIPID PANEL
Cholesterol: 225 mg/dL — ABNORMAL HIGH (ref <200)
HDL: 55 mg/dL (ref >50)
LDL Cholesterol: 143 mg/dL — ABNORMAL HIGH (ref <100)
Total CHOL/HDL Ratio: 4.1 Ratio (ref <5.0)
Triglycerides: 134 mg/dL (ref <150)
VLDL: 27 mg/dL (ref <30)

## 2016-09-01 NOTE — Progress Notes (Signed)
   I reviewed health advisor's note, was available for consultation and agree with the assessment and plan as written.  Will give shingrix Rx at appt with me.  Melody Cirrincione L. Adonai Helzer, D.O. [facility] [address], [place] [address] Cell Phone (Mon-Fri 8am-5pm):  [phone] On Call:  [phone] & follow prompts after 5pm & weekends Office Phone:  [phone] Office Fax:  [phone]   Quick Notes   Health Maintenance: Pt expressed interest in getting Shingrex. Could you put in a prescription for her     Abnormal Screen: MMSE 30/30. Passed Clock drawing     Patient Concerns: None     Nurse Concerns: Pt underweight, discussed ways to increase calories and nutrients.

## 2016-09-01 NOTE — Patient Instructions (Signed)
Alexandria Wright , Thank you for taking time to come for your Medicare Wellness Visit. I appreciate your ongoing commitment to your health goals. Please review the following plan we discussed and let me know if I can assist you in the future.   Screening recommendations/referrals: Colonoscopy up to date Mammogram up to date Bone Density up to date Recommended yearly ophthalmology/optometry visit for glaucoma screening and checkup Recommended yearly dental visit for hygiene and checkup  Vaccinations: Influenza vaccine up to date Pneumococcal vaccine up to date Tdap vaccine up to date. Due 07/10/2021 Shingles vaccine up to date. I will tell Dr. Mariea Clonts that you want Shingrex and she will put in prescription for you pharmacy.  Advanced directives: In Chart  Conditions/risks identified: BMI shows you are underweight for your height. We have talked about ways to increase your calorie and nutrient intake. Take it slow and be patient with yourself. Call if you ever need anything  Next appointment: Dr. Mariea Clonts 09/05/2016 @ 8:45 am   Preventive Care 65 Years and Older, Female Preventive care refers to lifestyle choices and visits with your health care provider that can promote health and wellness. What does preventive care include?  A yearly physical exam. This is also called an annual well check.  Dental exams once or twice a year.  Routine eye exams. Ask your health care provider how often you should have your eyes checked.  Personal lifestyle choices, including:  Daily care of your teeth and gums.  Regular physical activity.  Eating a healthy diet.  Avoiding tobacco and drug use.  Limiting alcohol use.  Practicing safe sex.  Taking low-dose aspirin every day.  Taking vitamin and mineral supplements as recommended by your health care provider. What happens during an annual well check? The services and screenings done by your health care provider during your annual well check will  depend on your age, overall health, lifestyle risk factors, and family history of disease. Counseling  Your health care provider may ask you questions about your:  Alcohol use.  Tobacco use.  Drug use.  Emotional well-being.  Home and relationship well-being.  Sexual activity.  Eating habits.  History of falls.  Memory and ability to understand (cognition).  Work and work Statistician.  Reproductive health. Screening  You may have the following tests or measurements:  Height, weight, and BMI.  Blood pressure.  Lipid and cholesterol levels. These may be checked every 5 years, or more frequently if you are over 15 years old.  Skin check.  Lung cancer screening. You may have this screening every year starting at age 27 if you have a 30-pack-year history of smoking and currently smoke or have quit within the past 15 years.  Fecal occult blood test (FOBT) of the stool. You may have this test every year starting at age 56.  Flexible sigmoidoscopy or colonoscopy. You may have a sigmoidoscopy every 5 years or a colonoscopy every 10 years starting at age 25.  Hepatitis C blood test.  Hepatitis B blood test.  Sexually transmitted disease (STD) testing.  Diabetes screening. This is done by checking your blood sugar (glucose) after you have not eaten for a while (fasting). You may have this done every 1-3 years.  Bone density scan. This is done to screen for osteoporosis. You may have this done starting at age 71.  Mammogram. This may be done every 1-2 years. Talk to your health care provider about how often you should have regular mammograms. Talk with your health  care provider about your test results, treatment options, and if necessary, the need for more tests. Vaccines  Your health care provider may recommend certain vaccines, such as:  Influenza vaccine. This is recommended every year.  Tetanus, diphtheria, and acellular pertussis (Tdap, Td) vaccine. You may need a  Td booster every 10 years.  Zoster vaccine. You may need this after age 20.  Pneumococcal 13-valent conjugate (PCV13) vaccine. One dose is recommended after age 72.  Pneumococcal polysaccharide (PPSV23) vaccine. One dose is recommended after age 84. Talk to your health care provider about which screenings and vaccines you need and how often you need them. This information is not intended to replace advice given to you by your health care provider. Make sure you discuss any questions you have with your health care provider. Document Released: 05/25/2015 Document Revised: 01/16/2016 Document Reviewed: 02/27/2015 Elsevier Interactive Patient Education  2017 [place] Prevention in the Home Falls can cause injuries. They can happen to people of all ages. There are many things you can do to make your home safe and to help prevent falls. What can I do on the outside of my home?  Regularly fix the edges of walkways and driveways and fix any cracks.  Remove anything that might make you trip as you walk through a door, such as a raised step or threshold.  Trim any bushes or trees on the path to your home.  Use bright outdoor lighting.  Clear any walking paths of anything that might make someone trip, such as rocks or tools.  Regularly check to see if handrails are loose or broken. Make sure that both sides of any steps have handrails.  Any raised decks and porches should have guardrails on the edges.  Have any leaves, snow, or ice cleared regularly.  Use sand or salt on walking paths during winter.  Clean up any spills in your garage right away. This includes oil or grease spills. What can I do in the bathroom?  Use night lights.  Install grab bars by the toilet and in the tub and shower. Do not use towel bars as grab bars.  Use non-skid mats or decals in the tub or shower.  If you need to sit down in the shower, use a plastic, non-slip stool.  Keep the floor dry. Clean  up any water that spills on the floor as soon as it happens.  Remove soap buildup in the tub or shower regularly.  Attach bath mats securely with double-sided non-slip rug tape.  Do not have throw rugs and other things on the floor that can make you trip. What can I do in the bedroom?  Use night lights.  Make sure that you have a light by your bed that is easy to reach.  Do not use any sheets or blankets that are too big for your bed. They should not hang down onto the floor.  Have a firm chair that has side arms. You can use this for support while you get dressed.  Do not have throw rugs and other things on the floor that can make you trip. What can I do in the kitchen?  Clean up any spills right away.  Avoid walking on wet floors.  Keep items that you use a lot in easy-to-reach places.  If you need to reach something above you, use a strong step stool that has a grab bar.  Keep electrical cords out of the way.  Do not use floor  polish or wax that makes floors slippery. If you must use wax, use non-skid floor wax.  Do not have throw rugs and other things on the floor that can make you trip. What can I do with my stairs?  Do not leave any items on the stairs.  Make sure that there are handrails on both sides of the stairs and use them. Fix handrails that are broken or loose. Make sure that handrails are as long as the stairways.  Check any carpeting to make sure that it is firmly attached to the stairs. Fix any carpet that is loose or worn.  Avoid having throw rugs at the top or bottom of the stairs. If you do have throw rugs, attach them to the floor with carpet tape.  Make sure that you have a light switch at the top of the stairs and the bottom of the stairs. If you do not have them, ask someone to add them for you. What else can I do to help prevent falls?  Wear shoes that:  Do not have high heels.  Have rubber bottoms.  Are comfortable and fit you well.  Are  closed at the toe. Do not wear sandals.  If you use a stepladder:  Make sure that it is fully opened. Do not climb a closed stepladder.  Make sure that both sides of the stepladder are locked into place.  Ask someone to hold it for you, if possible.  Clearly mark and make sure that you can see:  Any grab bars or handrails.  First and last steps.  Where the edge of each step is.  Use tools that help you move around (mobility aids) if they are needed. These include:  Canes.  Walkers.  Scooters.  Crutches.  Turn on the lights when you go into a dark area. Replace any light bulbs as soon as they burn out.  Set up your furniture so you have a clear path. Avoid moving your furniture around.  If any of your floors are uneven, fix them.  If there are any pets around you, be aware of where they are.  Review your medicines with your doctor. Some medicines can make you feel dizzy. This can increase your chance of falling. Ask your doctor what other things that you can do to help prevent falls. This information is not intended to replace advice given to you by your health care provider. Make sure you discuss any questions you have with your health care provider. Document Released: 02/22/2009 Document Revised: 10/04/2015 Document Reviewed: 06/02/2014 Elsevier Interactive Patient Education  2017 [employer].

## 2016-09-01 NOTE — Progress Notes (Signed)
Subjective:   Alexandria Wright is a 83 y.o. female who presents for Medicare Annual (Subsequent) preventive examination.      Objective:     Vitals: BP 135/70 (BP Location: Left Arm, Patient Position: Sitting)   Pulse 80   Ht 5\' 4"  (1.626 m)   Wt 90 lb (40.8 kg)   BMI 15.45 kg/m   Body mass index is 15.45 kg/m.   Tobacco History  Smoking Status  . Never Smoker  Smokeless Tobacco  . Never Used     Counseling given: Not Answered   Past Medical History:  Diagnosis Date  . Bradycardia    a. s/p MDT dual chamber PPM   . Esophageal stricture   . GERD (gastroesophageal reflux disease)   . Skin cancer   . Syncope   . Tachycardia    asyptomatic nonsustained   Past Surgical History:  Procedure Laterality Date  . CATARACT EXTRACTION  12/10/2011   right   . CATARACT EXTRACTION  12/17/2011   left  . PACEMAKER PLACEMENT  2008   MDT dual chamber PPM implanted by Dr Caryl Comes for symptomatic bradycardia  . SKIN CANCER EXCISION    . TONSILLECTOMY AND ADENOIDECTOMY     Family History  Problem Relation Age of Onset  . Heart disease Mother   . Colon cancer Neg Hx    History  Sexual Activity  . Sexual activity: Not on file    Outpatient Encounter Prescriptions as of 09/01/2016  Medication Sig  . Calcium Carbonate-Vitamin D (OSCAL 500/200 D-3 PO) Take 1 tablet by mouth daily.   . Cholecalciferol (VITAMIN D PO) Take 2,000 Units by mouth daily.  . diclofenac sodium (VOLTAREN) 1 % GEL Apply 4 g topically 4 (four) times daily.  . metoprolol succinate (TOPROL-XL) 25 MG 24 hr tablet TAKE 1 TABLET BY MOUTH DAILY  . Multiple Vitamin (MULTI-VITAMIN PO) Take 1 tablet by mouth daily.    No facility-administered encounter medications on file as of 09/01/2016.     Activities of Daily Living In your present state of health, do you have any difficulty performing the following activities: 09/01/2016  Hearing? Y  Vision? Y  Difficulty concentrating or making decisions? N  Walking or  climbing stairs? N  Dressing or bathing? N  Doing errands, shopping? N  Preparing Food and eating ? N  Using the Toilet? N  In the past six months, have you accidently leaked urine? N  Do you have problems with loss of bowel control? N  Managing your Medications? N  Managing your Finances? N  Housekeeping or managing your Housekeeping? N  Some recent data might be hidden    Patient Care Team: Gayland Curry, DO as PCP - General (Geriatric Medicine) Marygrace Drought, MD as Consulting Physician (Ophthalmology)    Assessment:    Exercise Activities and Dietary recommendations Current Exercise Habits: Home exercise routine, Type of exercise: walking (2 and a half miles), Time (Minutes): > 60, Frequency (Times/Week): 5, Weekly Exercise (Minutes/Week): 0, Intensity: Mild, Exercise limited by: None identified  Goals    . patient stated          Starting 09/01/2016 I will try to eat dinner every night of the week.      Fall Risk Fall Risk  09/01/2016 08/31/2015 08/25/2014 08/22/2013  Falls in the past year? Yes No No No   Depression Screen PHQ 2/9 Scores 09/01/2016 08/31/2015 08/25/2014 08/22/2013  PHQ - 2 Score 0 0 0 0     Cognitive  Function MMSE - Mini Mental State Exam 09/01/2016 08/31/2015 08/25/2014 08/22/2013  Orientation to time 5 5 5 5   Orientation to Place 5 5 5 5   Registration 3 3 3 3   Attention/ Calculation 5 5 5 5   Recall 3 3 2 2   Language- name 2 objects 2 2 2 2   Language- repeat 1 1 1 1   Language- follow 3 step command 3 3 3 3   Language- read & follow direction 1 1 1 1   Write a sentence 1 1 1 1   Copy design 1 1 1 1   Total score 30 30 29 29         Immunization History  Administered Date(s) Administered  . Influenza-Unspecified 01/25/2013, 02/05/2016  . Pneumococcal Conjugate-13 08/25/2014, 02/05/2016  . Pneumococcal Polysaccharide-23 08/26/2011  . Tdap 07/11/2011  . Zoster 08/23/2013   Screening Tests Health Maintenance  Topic Date Due  . INFLUENZA VACCINE   12/10/2016  . TETANUS/TDAP  07/10/2021  . DEXA SCAN  Completed  . PNA vac Low Risk Adult  Completed      Plan:  I have personally reviewed and addressed the Medicare Annual Wellness questionnaire and have noted the following in the patient's chart:  A. Medical and social history B. Use of alcohol, tobacco or illicit drugs  C. Current medications and supplements D. Functional ability and status E.  Nutritional status F.  Physical activity G. Advance directives H. List of other physicians I.  Hospitalizations, surgeries, and ER visits in previous 12 months J.  [place] to include hearing, vision, cognitive, depression L. Referrals and appointments - none  In addition, I have reviewed and discussed with patient certain preventive protocols, quality metrics, and best practice recommendations. A written personalized care plan for preventive services as well as general preventive health recommendations were provided to patient.  See attached scanned questionnaire for additional information.   Signed,   Rich Reining, RN Nurse Health Advisor

## 2016-09-04 ENCOUNTER — Encounter: Payer: Self-pay | Admitting: *Deleted

## 2016-09-05 ENCOUNTER — Ambulatory Visit (INDEPENDENT_AMBULATORY_CARE_PROVIDER_SITE_OTHER): Payer: Medicare Other | Admitting: Internal Medicine

## 2016-09-05 ENCOUNTER — Encounter: Payer: Self-pay | Admitting: Internal Medicine

## 2016-09-05 VITALS — BP 122/80 | HR 68 | Temp 97.8°F | Ht 64.0 in | Wt 89.0 lb

## 2016-09-05 DIAGNOSIS — Z950 Presence of cardiac pacemaker: Secondary | ICD-10-CM

## 2016-09-05 DIAGNOSIS — R636 Underweight: Secondary | ICD-10-CM | POA: Diagnosis not present

## 2016-09-05 DIAGNOSIS — E7800 Pure hypercholesterolemia, unspecified: Secondary | ICD-10-CM | POA: Diagnosis not present

## 2016-09-05 DIAGNOSIS — K219 Gastro-esophageal reflux disease without esophagitis: Secondary | ICD-10-CM

## 2016-09-05 DIAGNOSIS — Z0000 Encounter for general adult medical examination without abnormal findings: Secondary | ICD-10-CM | POA: Diagnosis not present

## 2016-09-05 DIAGNOSIS — M810 Age-related osteoporosis without current pathological fracture: Secondary | ICD-10-CM

## 2016-09-05 DIAGNOSIS — I495 Sick sinus syndrome: Secondary | ICD-10-CM

## 2016-09-05 DIAGNOSIS — I10 Essential (primary) hypertension: Secondary | ICD-10-CM

## 2016-09-05 DIAGNOSIS — K222 Esophageal obstruction: Secondary | ICD-10-CM | POA: Diagnosis not present

## 2016-09-05 NOTE — Progress Notes (Signed)
Provider:  Rexene Edison. Mariea Clonts, D.O., C.M.D. Location:   [facility]   Place of Service:   clinic  Previous PCP: Hollace Kinnier, DO Patient Care Team: Gayland Curry, DO as PCP - General (Geriatric Medicine) Marygrace Drought, MD as Consulting Physician (Ophthalmology)  Extended Emergency Contact Information Primary Emergency Contact: Selinda Eon,  Home Phone: [phone] Relation: None  Code Status: DNR Goals of Care: Advanced Directive information Advanced Directives 09/05/2016  Does Patient Have a Medical Advance Directive? Yes  Type of Paramedic of [place];Living will;Out of facility DNR (pink MOST or yellow form)  Does patient want to make changes to medical advance directive? -  Copy of [place] in Chart? Yes  Pre-existing out of facility DNR order (yellow form or pink MOST form) Yellow form placed in chart (order not valid for inpatient use);Pink MOST form placed in chart (order not valid for inpatient use)   Chief Complaint  Patient presents with  . Annual Exam    CPE    HPI: Patient is a 83 y.o. female seen today for an annual physical exam.  Has pacemaker so follows with Dr. Caryl Comes.  No problems there.  On metoprolol succinate.  Had her wellness visit with Clarise Cruz, RN.  She remains underweight.  Counseled on eating some higher calorie foods and drinking boost or ensure supplements.  Shingrix:  Went to drug store yesterday, showed her insurance card--she got her first shot yesterday at [facility] at [employer].  Advised to get the next one at 3 mos.    Reviewed labs with her which showed LDL of 143.  At her age and function and underweight status, would not intervene.  She's very active and walks regularly.    Great niece she helped raise is graduating and then getting married, has a job offer already.  Her fiance also has an Engineer, production job.    No GERD symptoms.  Every now and then something will get stuck and she  has to cough and cough to get it loose.  It's much less often than it used to be--she says she will let me know if she needs to go back to GI.  Had dilation three previous times.  Refuses recheck of bone density.  Does not want to take meds for this and walks regularly.  Does take her vitamins as ordered.  Past Medical History:  Diagnosis Date  . Bradycardia    a. s/p MDT dual chamber PPM   . Esophageal stricture   . GERD (gastroesophageal reflux disease)   . Skin cancer   . Syncope   . Tachycardia    asyptomatic nonsustained   Past Surgical History:  Procedure Laterality Date  . CATARACT EXTRACTION  12/10/2011   right   . CATARACT EXTRACTION  12/17/2011   left  . PACEMAKER PLACEMENT  2008   MDT dual chamber PPM implanted by Dr Caryl Comes for symptomatic bradycardia  . SKIN CANCER EXCISION    . TONSILLECTOMY AND ADENOIDECTOMY      reports that she has never smoked. She has never used smokeless tobacco. She reports that she does not drink alcohol or use drugs.  Functional Status Survey:  fully independent--see AWV for details  Family History  Problem Relation Age of Onset  . Heart disease Mother   . Colon cancer Neg Hx     Health Maintenance  Topic Date Due  . INFLUENZA VACCINE  12/10/2016  .  TETANUS/TDAP  07/10/2021  . DEXA SCAN  Completed  . PNA vac Low Risk Adult  Completed    Allergies  Allergen Reactions  . Sulfonamide Derivatives Other (See Comments)    Made very sick, per pt   . Penicillins Swelling and Rash    Allergies as of 09/05/2016      Reactions   Sulfonamide Derivatives Other (See Comments)   Made very sick, per pt   Penicillins Swelling, Rash      Medication List       Accurate as of 09/05/16  9:05 AM. Always use your most recent med list.          diclofenac sodium 1 % Gel Commonly known as:  VOLTAREN Apply 4 g topically 4 (four) times daily.   metoprolol succinate 25 MG 24 hr tablet Commonly known as:  TOPROL-XL TAKE 1 TABLET BY MOUTH  DAILY   MULTI-VITAMIN PO Take 1 tablet by mouth daily.   OSCAL 500/200 D-3 PO Take 1 tablet by mouth daily.   VITAMIN D PO Take 2,000 Units by mouth daily.       Review of Systems  Constitutional: Positive for weight loss. Negative for chills, diaphoresis, fever and malaise/fatigue.  HENT: Negative for congestion.   Eyes: Negative for blurred vision.  Respiratory: Negative for cough and shortness of breath.   Cardiovascular: Negative for chest pain, palpitations and leg swelling.  Gastrointestinal: Negative for abdominal pain, blood in stool, constipation, diarrhea, heartburn, melena, nausea and vomiting.  Genitourinary: Negative for dysuria, frequency and urgency.  Musculoskeletal: Negative for falls and joint pain.  Skin: Negative for itching and rash.  Neurological: Negative for dizziness, loss of consciousness and weakness.  Endo/Heme/Allergies: Does not bruise/bleed easily.  Psychiatric/Behavioral: Negative for depression and memory loss. The patient is not nervous/anxious and does not have insomnia.     Vitals:   09/05/16 0845  BP: 122/80  Pulse: 68  Temp: 97.8 F (36.6 C)  TempSrc: Oral  Weight: 89 lb (40.4 kg)  Height: 5\' 4"  (1.626 m)   Body mass index is 15.28 kg/m. Physical Exam  Constitutional: She is oriented to person, place, and time. No distress.  Cachectic appearing, but spry female  HENT:  Head: Normocephalic and atraumatic.  Right Ear: External ear normal.  Left Ear: External ear normal.  Nose: Nose normal.  Mouth/Throat: Oropharynx is clear and moist. No oropharyngeal exudate.  Eyes: Conjunctivae and EOM are normal. Pupils are equal, round, and reactive to light.  Neck: Neck supple. No JVD present. No tracheal deviation present. No thyromegaly present.  Cardiovascular: Normal rate, regular rhythm, normal heart sounds and intact distal pulses.   Pulmonary/Chest: Effort normal and breath sounds normal. No respiratory distress. Right breast  exhibits no inverted nipple, no mass, no nipple discharge, no skin change and no tenderness. Left breast exhibits no inverted nipple, no mass, no nipple discharge, no skin change and no tenderness.  Abdominal: Soft. Bowel sounds are normal. She exhibits no distension. There is no tenderness.  Musculoskeletal: Normal range of motion. She exhibits no edema, tenderness or deformity.  Lymphadenopathy:    She has no cervical adenopathy.  Neurological: She is alert and oriented to person, place, and time. She displays normal reflexes. No cranial nerve deficit. Coordination normal.  Skin: Skin is warm and dry. Capillary refill takes less than 2 seconds.  Psychiatric: She has a normal mood and affect. Her behavior is normal. Judgment and thought content normal.    Labs reviewed: Basic Metabolic Panel:  Recent Labs  09/01/16 0820  NA 137  K 4.1  CL 100  CO2 28  GLUCOSE 104*  BUN 17  CREATININE 0.79  CALCIUM 9.2   Liver Function Tests:  Recent Labs  09/01/16 0820  AST 12  ALT 6  ALKPHOS 69  BILITOT 0.7  PROT 6.9  ALBUMIN 4.0   No results for input(s): LIPASE, AMYLASE in the last 8760 hours. No results for input(s): AMMONIA in the last 8760 hours. CBC:  Recent Labs  09/01/16 0820  WBC 6.9  NEUTROABS 5,313  HGB 14.9  HCT 45.1*  MCV 92.2  PLT 240   Cardiac Enzymes: No results for input(s): CKTOTAL, CKMB, CKMBINDEX, TROPONINI in the last 8760 hours. BNP: Invalid input(s): POCBNP No results found for: HGBA1C Lab Results  Component Value Date   TSH 2.500 08/22/2013    Assessment/Plan 1. Annual physical exam -performed today - CBC with Differential/Platelet; Future - COMPLETE METABOLIC PANEL WITH GFR; Future - Lipid panel; Future - TSH; Future  2. Essential hypertension, benign - bp at goal with current therapy, cont same regimen and exercise program - CBC with Differential/Platelet; Future - COMPLETE METABOLIC PANEL WITH GFR; Future  3. Senile  osteoporosis -cont ca with D, D3 and walking -refuses f/u study or medications  4. Tachy-brady syndrome (HCC) -has pacer, cont metoprolol succinate  5. S/P placement of cardiac pacemaker -stable, follows with cardiology  6. Gastroesophageal reflux disease, esophagitis presence not specified - no current symptoms, no on medication - COMPLETE METABOLIC PANEL WITH GFR; Future  7. Esophageal stricture -reports that she is not having enough difficulty swallowing to have another dilation done despite my encouragement, is losing weight over time - COMPLETE METABOLIC PANEL WITH GFR; Future  8. Underweight - now down to 89, oddly spry considering her cachectic appearance and reports eating well, encouraged her to begin boost or ensure and given several chocolate samples to try  - CBC with Differential/Platelet; Future - COMPLETE METABOLIC PANEL WITH GFR; Future - TSH; Future  9. Pure hypercholesterolemia -has elevated LDL but has refused medication intervention for years, eats a healthy diet and is very active - Lipid panel; Future  Labs/tests ordered:   Orders Placed This Encounter  Procedures  . CBC with Differential/Platelet    Standing Status:   Future    Standing Expiration Date:   09/06/2018  . COMPLETE METABOLIC PANEL WITH GFR    Standing Status:   Future    Standing Expiration Date:   09/06/2018  . Lipid panel    Standing Status:   Future    Standing Expiration Date:   09/06/2018  . TSH    Standing Status:   Future    Standing Expiration Date:   09/06/2018   f/u at one year and prn  Patricie Geeslin L. Danzell Birky, D.O. [facility] [address], [place] [address] Cell Phone (Mon-Fri 8am-5pm):  [phone] On Call:  [phone] & follow prompts after 5pm & weekends Office Phone:  [phone] Office Fax:  [phone]

## 2016-10-09 ENCOUNTER — Encounter: Payer: Self-pay | Admitting: Internal Medicine

## 2016-10-17 ENCOUNTER — Ambulatory Visit (INDEPENDENT_AMBULATORY_CARE_PROVIDER_SITE_OTHER): Payer: Medicare Other | Admitting: Internal Medicine

## 2016-10-17 ENCOUNTER — Encounter: Payer: Self-pay | Admitting: Internal Medicine

## 2016-10-17 VITALS — BP 100/70 | HR 82 | Ht 65.0 in | Wt 91.0 lb

## 2016-10-17 DIAGNOSIS — Z950 Presence of cardiac pacemaker: Secondary | ICD-10-CM | POA: Diagnosis not present

## 2016-10-17 DIAGNOSIS — I493 Ventricular premature depolarization: Secondary | ICD-10-CM | POA: Diagnosis not present

## 2016-10-17 DIAGNOSIS — I495 Sick sinus syndrome: Secondary | ICD-10-CM | POA: Diagnosis not present

## 2016-10-17 NOTE — Patient Instructions (Signed)
Medication Instructions: - Your physician has recommended you make the following change in your medication:  1) Stop metoprolol  Labwork: - none ordered  Procedures/Testing: - Your physician has requested that you have an echocardiogram. Echocardiography is a painless test that uses sound waves to create images of your heart. It provides your doctor with information about the size and shape of your heart and how well your heart's chambers and valves are working. This procedure takes approximately one hour. There are no restrictions for this procedure.  Follow-Up: - Remote monitoring is used to monitor your Pacemaker of ICD from home. This monitoring reduces the number of office visits required to check your device to one time per year. It allows [place] to keep an eye on the functioning of your device to ensure it is working properly. You are scheduled for a device check from home on 01/19/17. You may send your transmission at any time that day. If you have a wireless device, the transmission will be sent automatically. After your physician reviews your transmission, you will receive a postcard with your next transmission date.  - Your physician wants you to follow-up in: 1 year with Dr. Caryl Comes. You will receive a reminder letter in the mail two months in advance. If you don't receive a letter, please call our office to schedule the follow-up appointment.  Any Additional Special Instructions Will Be Listed Below (If Applicable).     If you need a refill on your cardiac medications before your next appointment, please call your pharmacy.

## 2016-10-17 NOTE — Progress Notes (Signed)
      Patient Care Team: Gayland Curry, DO as PCP - General (Geriatric Medicine) Marygrace Drought, MD as Consulting Physician (Ophthalmology)   HPI  Alexandria Wright is a 83 y.o. female seen in followup for syncope, bradycardia, and status post pacemaker implantation in October 2008.   She also had nonsustained ventricular tachycardia that turned out to be asymptomatic. She is unaware of PVCs   The patient denies chest pain, shortness of breath, nocturnal dyspnea, orthopnea or peripheral edema.  There have been no palpitations, lightheadedness or syncope.   Her grandnieces are doing well    Records and Results Reviewed   Past Medical History:  Diagnosis Date  . Bradycardia    a. s/p MDT dual chamber PPM   . Esophageal stricture   . GERD (gastroesophageal reflux disease)   . Skin cancer   . Syncope   . Tachycardia    asyptomatic nonsustained    Past Surgical History:  Procedure Laterality Date  . CATARACT EXTRACTION  12/10/2011   right   . CATARACT EXTRACTION  12/17/2011   left  . PACEMAKER PLACEMENT  2008   MDT dual chamber PPM implanted by Dr Caryl Comes for symptomatic bradycardia  . SKIN CANCER EXCISION    . TONSILLECTOMY AND ADENOIDECTOMY      Current Outpatient Prescriptions  Medication Sig Dispense Refill  . Calcium Carbonate-Vitamin D (OSCAL 500/200 D-3 PO) Take 1 tablet by mouth daily.     . Cholecalciferol (VITAMIN D PO) Take 2,000 Units by mouth daily.    . diclofenac sodium (VOLTAREN) 1 % GEL Apply 4 g topically 4 (four) times daily. 100 g 3  . metoprolol succinate (TOPROL-XL) 25 MG 24 hr tablet TAKE 1 TABLET BY MOUTH DAILY 90 tablet 3  . Multiple Vitamin (MULTI-VITAMIN PO) Take 1 tablet by mouth daily.      No current facility-administered medications for this visit.     Allergies  Allergen Reactions  . Sulfonamide Derivatives Other (See Comments)    Made very sick, per pt   . Penicillins Swelling and Rash      Review of Systems negative  except from HPI and PMH  Physical Exam BP 100/70   Pulse 82   Ht 5\' 5"  (1.651 m)   Wt 91 lb (41.3 kg)   SpO2 98%   BMI 15.14 kg/m  Well developed and well nourished in no acute distress HENT normal E scleral and icterus clear Neck Supple JVP flat; carotids brisk and full Clear to ausculation Regular rate and rhythm, no murmurs gallops or rub Soft with active bowel sounds No clubbing cyanosis  Edema Alert and oriented, grossly normal motor and sensory function Skin Warm and Dry  ECG  Sinus @ 79 21/15/42 PVCs RBBB super axis   Assessment and  Plan  Assessment and  Plan  Sinus node dysfunction-intermittent  PVCs   Pacemaker Medtronic  The patient's device was interrogated.  The information was reviewed. No changes  She is approaching ERI ; have discussed the possible impact of VVI reversion on symptoms  Her PVC burden is high by ecg , but considerably less over the last year based on pacemaker,  Suspect there has been interval increase.  Will check echo to assess for possible PVC cardiomyopathy      Current medicines are reviewed at length with the patient today .  The patient does not  have concerns regarding medicines.

## 2016-10-23 LAB — CUP PACEART INCLINIC DEVICE CHECK
Battery Impedance: 4447 Ohm
Brady Statistic AP VS Percent: 8 %
Brady Statistic AS VP Percent: 1 %
Brady Statistic AS VS Percent: 91 %
Date Time Interrogation Session: 20180608131523
Implantable Lead Location: 753859
Implantable Lead Location: 753860
Implantable Lead Model: 5076
Lead Channel Impedance Value: 584 Ohm
Lead Channel Impedance Value: 602 Ohm
Lead Channel Pacing Threshold Pulse Width: 0.4 ms
Lead Channel Sensing Intrinsic Amplitude: 11.2 mV
Lead Channel Sensing Intrinsic Amplitude: 2 mV
Lead Channel Setting Pacing Amplitude: 2 V
Lead Channel Setting Pacing Pulse Width: 0.4 ms
MDC IDC LEAD IMPLANT DT: 20080703
MDC IDC LEAD IMPLANT DT: 20080703
MDC IDC MSMT BATTERY REMAINING LONGEVITY: 7 mo
MDC IDC MSMT BATTERY VOLTAGE: 2.64 V
MDC IDC MSMT LEADCHNL RA PACING THRESHOLD AMPLITUDE: 0.75 V
MDC IDC MSMT LEADCHNL RA PACING THRESHOLD PULSEWIDTH: 0.4 ms
MDC IDC MSMT LEADCHNL RV PACING THRESHOLD AMPLITUDE: 0.5 V
MDC IDC PG IMPLANT DT: 20080703
MDC IDC SET LEADCHNL RV PACING AMPLITUDE: 2.5 V
MDC IDC SET LEADCHNL RV SENSING SENSITIVITY: 5.6 mV
MDC IDC STAT BRADY AP VP PERCENT: 0 %

## 2016-10-30 ENCOUNTER — Other Ambulatory Visit: Payer: Self-pay

## 2016-10-30 ENCOUNTER — Ambulatory Visit (HOSPITAL_COMMUNITY): Payer: Medicare Other | Attending: Cardiology

## 2016-10-30 DIAGNOSIS — I071 Rheumatic tricuspid insufficiency: Secondary | ICD-10-CM | POA: Insufficient documentation

## 2016-10-30 DIAGNOSIS — Z950 Presence of cardiac pacemaker: Secondary | ICD-10-CM | POA: Insufficient documentation

## 2016-10-30 DIAGNOSIS — R55 Syncope and collapse: Secondary | ICD-10-CM | POA: Diagnosis not present

## 2016-10-30 DIAGNOSIS — I493 Ventricular premature depolarization: Secondary | ICD-10-CM | POA: Diagnosis not present

## 2017-01-19 ENCOUNTER — Ambulatory Visit (INDEPENDENT_AMBULATORY_CARE_PROVIDER_SITE_OTHER): Payer: Medicare Other | Admitting: *Deleted

## 2017-01-19 DIAGNOSIS — I495 Sick sinus syndrome: Secondary | ICD-10-CM

## 2017-01-19 NOTE — Progress Notes (Signed)
Remote pacemaker transmission.   

## 2017-01-20 LAB — CUP PACEART REMOTE DEVICE CHECK
Battery Impedance: 5995 Ohm
Battery Remaining Longevity: 1 mo — CL
Brady Statistic AP VP Percent: 0 %
Brady Statistic AS VS Percent: 98 %
Implantable Lead Implant Date: 20080703
Implantable Lead Location: 753860
Implantable Lead Model: 5076
Implantable Pulse Generator Implant Date: 20080703
Lead Channel Impedance Value: 545 Ohm
Lead Channel Impedance Value: 551 Ohm
Lead Channel Pacing Threshold Amplitude: 0.625 V
Lead Channel Setting Pacing Amplitude: 2 V
Lead Channel Setting Pacing Amplitude: 2.5 V
MDC IDC LEAD IMPLANT DT: 20080703
MDC IDC LEAD LOCATION: 753859
MDC IDC MSMT BATTERY VOLTAGE: 2.61 V
MDC IDC MSMT LEADCHNL RA PACING THRESHOLD PULSEWIDTH: 0.4 ms
MDC IDC MSMT LEADCHNL RV PACING THRESHOLD AMPLITUDE: 0.625 V
MDC IDC MSMT LEADCHNL RV PACING THRESHOLD PULSEWIDTH: 0.4 ms
MDC IDC SESS DTM: 20180910130757
MDC IDC SET LEADCHNL RV PACING PULSEWIDTH: 0.4 ms
MDC IDC SET LEADCHNL RV SENSING SENSITIVITY: 4 mV
MDC IDC STAT BRADY AP VS PERCENT: 0 %
MDC IDC STAT BRADY AS VP PERCENT: 1 %

## 2017-01-21 ENCOUNTER — Encounter: Payer: Self-pay | Admitting: Cardiology

## 2017-02-17 ENCOUNTER — Telehealth: Payer: Self-pay | Admitting: Cardiology

## 2017-02-17 ENCOUNTER — Ambulatory Visit (INDEPENDENT_AMBULATORY_CARE_PROVIDER_SITE_OTHER): Payer: Self-pay | Admitting: *Deleted

## 2017-02-17 DIAGNOSIS — Z950 Presence of cardiac pacemaker: Secondary | ICD-10-CM

## 2017-02-17 NOTE — Telephone Encounter (Signed)
Called pt and informed her that we received her remote transmission and it alerted that she has reached ERI. Informed pt that I would have a scheduler contact her to schedule an appt w/ MD / PA / NP to discuss the procedure and the procedure will be scheduled for another day.

## 2017-02-17 NOTE — Telephone Encounter (Signed)
Spoke with pt and reminded pt of remote transmission that is due today. Pt verbalized understanding.   

## 2017-02-18 NOTE — Progress Notes (Signed)
Remote pacemaker transmission.   

## 2017-02-19 LAB — CUP PACEART REMOTE DEVICE CHECK
Battery Impedance: 7241 Ohm
Battery Voltage: 2.62 V
Brady Statistic RV Percent Paced: 1 %
Date Time Interrogation Session: 20181009141502
Implantable Lead Implant Date: 20080703
Implantable Lead Implant Date: 20080703
Lead Channel Impedance Value: 67 Ohm
Lead Channel Setting Pacing Pulse Width: 0.4 ms
Lead Channel Setting Sensing Sensitivity: 5.6 mV
MDC IDC LEAD LOCATION: 753859
MDC IDC LEAD LOCATION: 753860
MDC IDC MSMT LEADCHNL RV IMPEDANCE VALUE: 596 Ohm
MDC IDC PG IMPLANT DT: 20080703
MDC IDC SET LEADCHNL RV PACING AMPLITUDE: 2.5 V

## 2017-02-22 NOTE — Progress Notes (Signed)
Cardiology Office Note Date:  02/24/2017  Patient ID:  Alexandria Wright [birth year], MRN [id number] PCP:  Gayland Curry, DO  Electrophysiologist:  Dr. Caryl Comes    Chief Complaint: device at ERI  History of Present Illness: Alexandria Wright is a 83 y.o. female with history of symtomatic bradycardia w/PPM, GERD, esophageal stricture, NSVT, comes to the office oday to be seen or Dr. Caryl Comes.  She last saw him June, at that time she was doing well, again noted to have a significant PVC burden, asymptomatic, planned for echo that showed normal LVEF.  She is feeling very well, no c/o CP, palpitations or SOB, no dizziness, near syncope or syncope.  She walks 5 miles a day 5 days a week without exertional intolerances.    Device information: MDT dual chamber PPM, implanted 11/12/06, Dr. Lovena Le  Past Medical History:  Diagnosis Date  . Bradycardia    a. s/p MDT dual chamber PPM   . Esophageal stricture   . GERD (gastroesophageal reflux disease)   . Skin cancer   . Syncope   . Tachycardia    asyptomatic nonsustained    Past Surgical History:  Procedure Laterality Date  . CATARACT EXTRACTION  12/10/2011   right   . CATARACT EXTRACTION  12/17/2011   left  . PACEMAKER PLACEMENT  2008   MDT dual chamber PPM implanted by Dr Caryl Comes for symptomatic bradycardia  . SKIN CANCER EXCISION    . TONSILLECTOMY AND ADENOIDECTOMY      Current Outpatient Prescriptions  Medication Sig Dispense Refill  . Calcium Carbonate-Vitamin D (OSCAL 500/200 D-3 PO) Take 1 tablet by mouth daily.     . Cholecalciferol (VITAMIN D PO) Take 2,000 Units by mouth daily.    . diclofenac sodium (VOLTAREN) 1 % GEL Apply 4 g topically 4 (four) times daily. 100 g 3  . FLUZONE HIGH-DOSE 0.5 ML injection TO BE ADMINISTERED BY PHARMACIST FOR IMMUNIZATION  0  . Multiple Vitamin (MULTI-VITAMIN PO) Take 1 tablet by mouth daily.     Marland Kitchen Zoster Vaccine Adjuvanted [facility]) injection inject 0.5 milliliter intramuscularly  0    No current facility-administered medications for this visit.     Allergies:   Sulfonamide derivatives and Penicillins   Social History:  The patient  reports that she has never smoked. She has never used smokeless tobacco. She reports that she does not drink alcohol or use drugs.   Family History:  The patient's family history includes Heart disease in her mother.  ROS:  Please see the history of present illness.  All other systems are reviewed and otherwise negative.   PHYSICAL EXAM:  VS:  BP 136/74   Pulse 87   Ht 5\' 5"  (1.651 m)   Wt 92 lb (41.7 kg)   BMI 15.31 kg/m  BMI: Body mass index is 15.31 kg/m. Well nourished, well developed though very thin body habitus, in no acute distress  HEENT: normocephalic, atraumatic  Neck: no JVD, carotid bruits or masses Cardiac: RRR; no significant murmurs, no rubs, or gallops Lungs:  CTA b/l, no wheezing, rhonchi or rales  Abd: soft, nontender MS: no deformity, age appropriate trophy Ext: no edema  Skin: warm and dry, no rash Neuro:  No gross deficits appreciated Psych: euthymic mood, full affect  PPM site is stable, no tethering or discomfort   EKG:  Done today  And reviewed by myself shows SR 87bpm, PR 143ms, QRS 4ms, QTc 430ms, Q V1-2 PPM interrogation done today and reviewed by  myself: battery has reached ERI, appears on/about 02/16/17, lead trends are stable, has converted to VVI programming, 2 HVR, no EGMs, 5 and 11 seconds, 1.3% VP  10/30/16 TTE Study Conclusions - Left ventricle: The cavity size was normal. Wall thickness was   normal. Systolic function was normal. The estimated ejection   fraction was in the range of 55% to 60%. Wall motion was normal;   there were no regional wall motion abnormalities. Doppler   parameters are consistent with abnormal left ventricular   relaxation (grade 1 diastolic dysfunction). - Aortic valve: Trileaflet; moderately calcified leaflets. There   was no stenosis. - Mitral valve:  Mildly calcified annulus. Mildly calcified   leaflets. The anterior leaflet had a hockey stick appearance   suggestive of rheumatic involvement. There was no evidence for   stenosis. There was trivial regurgitation. - Left atrium: The atrium was mildly dilated. - Right ventricle: The cavity size was normal. Pacer wire or   catheter noted in right ventricle. Systolic function was normal. - Tricuspid valve: Peak RV-RA gradient (S): 29 mm Hg. - Pulmonary arteries: PA peak pressure: 32 mm Hg (S). - Inferior vena cava: The vessel was normal in size. The   respirophasic diameter changes were in the normal range (>= 50%),   consistent with normal central venous pressure. - Pericardium, extracardiac: A trivial pericardial effusion was   identified. Impressions: - Normal LV size with EF 55-60%. Calcified, hockey stick-shaped   mitral valve appears rheumatic but no stenosis and trivial   regurgitation. Normal RV size and systolic function.  Recent Labs: 09/01/2016: ALT 6; BUN 17; Creat 0.79; Hemoglobin 14.9; Platelets 240; Potassium 4.1; Sodium 137  09/01/2016: Cholesterol 225; HDL 55; LDL Cholesterol 143; Total CHOL/HDL Ratio 4.1; Triglycerides 134; VLDL 27   CrCl cannot be calculated (Patient's most recent lab result is older than the maximum 21 days allowed.).   Wt Readings from Last 3 Encounters:  02/24/17 92 lb (41.7 kg)  10/17/16 91 lb (41.3 kg)  09/05/16 89 lb (40.4 kg)     Other studies reviewed: Additional studies/records reviewed today include: summarized above  ASSESSMENT AND PLAN:  1. PPM     Device has reached ERI     generator change procedure,  risks, benefits were discussed today and the patient would like to proceed  Plan for routine post gen change follow up   Disposition: as above  Current medicines are reviewed at length with the patient today.  The patient did not have any concerns regarding medicines.  Venetia Night, PA-C 02/24/2017 8:54 AM     [address] [place] [address] [phone] (office)  [phone] (fax)

## 2017-02-24 ENCOUNTER — Ambulatory Visit (INDEPENDENT_AMBULATORY_CARE_PROVIDER_SITE_OTHER): Payer: Medicare Other | Admitting: Physician Assistant

## 2017-02-24 ENCOUNTER — Encounter: Payer: Self-pay | Admitting: *Deleted

## 2017-02-24 VITALS — BP 136/74 | HR 87 | Ht 65.0 in | Wt 92.0 lb

## 2017-02-24 DIAGNOSIS — Z45010 Encounter for checking and testing of cardiac pacemaker pulse generator [battery]: Secondary | ICD-10-CM | POA: Diagnosis not present

## 2017-02-24 DIAGNOSIS — Z01818 Encounter for other preprocedural examination: Secondary | ICD-10-CM

## 2017-02-24 LAB — CBC
HEMOGLOBIN: 15.4 g/dL (ref 11.1–15.9)
Hematocrit: 46.1 % (ref 34.0–46.6)
MCH: 30.4 pg (ref 26.6–33.0)
MCHC: 33.4 g/dL (ref 31.5–35.7)
MCV: 91 fL (ref 79–97)
Platelets: 219 10*3/uL (ref 150–379)
RBC: 5.07 x10E6/uL (ref 3.77–5.28)
RDW: 14 % (ref 12.3–15.4)
WBC: 8.8 10*3/uL (ref 3.4–10.8)

## 2017-02-24 LAB — BASIC METABOLIC PANEL
BUN/Creatinine Ratio: 29 — ABNORMAL HIGH (ref 12–28)
BUN: 20 mg/dL (ref 8–27)
CALCIUM: 9.7 mg/dL (ref 8.7–10.3)
CHLORIDE: 98 mmol/L (ref 96–106)
CO2: 25 mmol/L (ref 20–29)
Creatinine, Ser: 0.68 mg/dL (ref 0.57–1.00)
GFR calc Af Amer: 92 mL/min/{1.73_m2} (ref >59)
GFR, EST NON AFRICAN AMERICAN: 80 mL/min/{1.73_m2} (ref >59)
Glucose: 101 mg/dL — ABNORMAL HIGH (ref 65–99)
POTASSIUM: 4.3 mmol/L (ref 3.5–5.2)
Sodium: 139 mmol/L (ref 134–144)

## 2017-02-24 LAB — APTT: APTT: 29 s (ref 24–33)

## 2017-02-24 NOTE — Patient Instructions (Addendum)
Medication Instructions:   Your physician recommends that you continue on your current medications as directed. Please refer to the Current Medication list given to you today.   If you need a refill on your cardiac medications before your next appointment, please call your pharmacy.  Labwork: BMET CBC PTT/PT   Testing/Procedures: SEE LETTER FOR GEN CHANGE ON 03-04-17   Follow-Up:  AFTER 03-04-17   10 DAY WOUND CHECK WITH DEVICE CLIINC    91 DAY POST PHYS PACER CHK WITH DR Caryl Comes    Any Other Special Instructions Will Be Listed Below (If Applicable).

## 2017-02-26 ENCOUNTER — Telehealth: Payer: Self-pay | Admitting: *Deleted

## 2017-02-26 NOTE — Telephone Encounter (Signed)
ATTEMPTED TO CALL PT NO VOICEMAIL SET UP

## 2017-02-26 NOTE — Telephone Encounter (Signed)
-----   Message from [facility], [place] sent at 02/24/2017  3:55 PM EDT ----- Please let the patient know her labs look good for her procedure.  Thanks renee

## 2017-03-04 ENCOUNTER — Ambulatory Visit (HOSPITAL_COMMUNITY)
Admission: RE | Admit: 2017-03-04 | Discharge: 2017-03-04 | Disposition: A | Discharge status: Home / Self Care | Payer: Medicare Other | Source: Ambulatory Visit | Attending: Internal Medicine | Admitting: Internal Medicine

## 2017-03-04 ENCOUNTER — Encounter (HOSPITAL_COMMUNITY)
Admission: RE | Disposition: A | Discharge status: Home / Self Care | Payer: Self-pay | Source: Ambulatory Visit | Attending: Internal Medicine

## 2017-03-04 ENCOUNTER — Encounter (HOSPITAL_COMMUNITY): Payer: Self-pay | Admitting: Internal Medicine

## 2017-03-04 DIAGNOSIS — Z880 Allergy status to penicillin: Secondary | ICD-10-CM | POA: Insufficient documentation

## 2017-03-04 DIAGNOSIS — Z45010 Encounter for checking and testing of cardiac pacemaker pulse generator [battery]: Secondary | ICD-10-CM | POA: Diagnosis not present

## 2017-03-04 DIAGNOSIS — I495 Sick sinus syndrome: Secondary | ICD-10-CM | POA: Insufficient documentation

## 2017-03-04 DIAGNOSIS — Z882 Allergy status to sulfonamides status: Secondary | ICD-10-CM | POA: Insufficient documentation

## 2017-03-04 DIAGNOSIS — K219 Gastro-esophageal reflux disease without esophagitis: Secondary | ICD-10-CM | POA: Diagnosis not present

## 2017-03-04 DIAGNOSIS — I493 Ventricular premature depolarization: Secondary | ICD-10-CM | POA: Diagnosis not present

## 2017-03-04 HISTORY — PX: PPM GENERATOR CHANGEOUT: EP1233

## 2017-03-04 LAB — SURGICAL PCR SCREEN
MRSA, PCR: NEGATIVE
Staphylococcus aureus: NEGATIVE

## 2017-03-04 SURGERY — PPM GENERATOR CHANGEOUT

## 2017-03-04 MED ORDER — SODIUM CHLORIDE 0.9 % IR SOLN
Status: AC
  Filled 2017-03-04: qty 2

## 2017-03-04 MED ORDER — MIDAZOLAM HCL 5 MG/5ML IJ SOLN
INTRAMUSCULAR | Status: AC
  Filled 2017-03-04: qty 5

## 2017-03-04 MED ORDER — VANCOMYCIN HCL IN DEXTROSE 1-5 GM/200ML-% IV SOLN
1000.0000 mg | INTRAVENOUS | Status: AC
  Administered 2017-03-04: 1000 mg via INTRAVENOUS
  Filled 2017-03-04: qty 200

## 2017-03-04 MED ORDER — CHLORHEXIDINE GLUCONATE 4 % EX LIQD
60.0000 mL | Freq: Once | CUTANEOUS | Status: DC
  Filled 2017-03-04: qty 60

## 2017-03-04 MED ORDER — SODIUM CHLORIDE 0.9 % IV SOLN
INTRAVENOUS | Status: DC
  Administered 2017-03-04: 09:00:00 via INTRAVENOUS

## 2017-03-04 MED ORDER — MUPIROCIN 2 % EX OINT
TOPICAL_OINTMENT | CUTANEOUS | Status: AC
  Administered 2017-03-04: 1
  Filled 2017-03-04: qty 22

## 2017-03-04 MED ORDER — BUPIVACAINE HCL (PF) 0.25 % IJ SOLN
INTRAMUSCULAR | Status: AC
  Filled 2017-03-04: qty 60

## 2017-03-04 MED ORDER — SODIUM CHLORIDE 0.9 % IR SOLN
80.0000 mg | Status: AC
  Administered 2017-03-04: 80 mg
  Filled 2017-03-04: qty 2

## 2017-03-04 MED ORDER — BUPIVACAINE HCL (PF) 0.25 % IJ SOLN
INTRAMUSCULAR | Status: DC | PRN
  Administered 2017-03-04: 35 mL

## 2017-03-04 MED ORDER — FENTANYL CITRATE (PF) 100 MCG/2ML IJ SOLN
INTRAMUSCULAR | Status: AC
  Filled 2017-03-04: qty 2

## 2017-03-04 MED ORDER — VANCOMYCIN HCL IN DEXTROSE 1-5 GM/200ML-% IV SOLN
INTRAVENOUS | Status: AC
  Filled 2017-03-04: qty 200

## 2017-03-04 SURGICAL SUPPLY — 6 items
CABLE SURGICAL S-101-97-12 (CABLE) ×1 IMPLANT
HEMOSTAT SURGICEL 2X4 FIBR (HEMOSTASIS) ×1 IMPLANT
PACEMAKER ADAPTA DR ADDR01 (Pacemaker) IMPLANT
PAD DEFIB LIFELINK (PAD) ×1 IMPLANT
PPM ADAPTA DR ADDR01 (Pacemaker) ×2 IMPLANT
TRAY PACEMAKER INSERTION (PACKS) ×1 IMPLANT

## 2017-03-04 NOTE — Interval H&P Note (Signed)
History and Physical Interval Note:  03/04/2017 9:34 AM  Alexandria Wright  has presented today for surgery, with the diagnosis of eri  The various methods of treatment have been discussed with the patient and family. After consideration of risks, benefits and other options for treatment, the patient has consented to  Procedure(s): PPM GENERATOR CHANGEOUT (N/A) as a surgical intervention .  The patient's history has been reviewed, patient examined, no change in status, stable for surgery.  I have reviewed the patient's chart and labs.  Questions were answered to the patient's satisfaction.     Virl Axe  Stable

## 2017-03-04 NOTE — H&P (View-Only) (Signed)
Cardiology Office Note Date:  02/24/2017  Patient ID:  Alexandria, Wright Sep 05, 1929, MRN [id number] PCP:  Gayland Curry, DO  Electrophysiologist:  Dr. Caryl Comes    Chief Complaint: device at ERI  History of Present Illness: Alexandria Wright is a 83 y.o. female with history of symtomatic bradycardia w/PPM, GERD, esophageal stricture, NSVT, comes to the office oday to be seen or Dr. Caryl Comes.  She last saw him June, at that time she was doing well, again noted to have a significant PVC burden, asymptomatic, planned for echo that showed normal LVEF.  She is feeling very well, no c/o CP, palpitations or SOB, no dizziness, near syncope or syncope.  She walks 5 miles a day 5 days a week without exertional intolerances.    Device information: MDT dual chamber PPM, implanted 11/12/06, Dr. Lovena Le  Past Medical History:  Diagnosis Date  . Bradycardia    a. s/p MDT dual chamber PPM   . Esophageal stricture   . GERD (gastroesophageal reflux disease)   . Skin cancer   . Syncope   . Tachycardia    asyptomatic nonsustained    Past Surgical History:  Procedure Laterality Date  . CATARACT EXTRACTION  12/10/2011   right   . CATARACT EXTRACTION  12/17/2011   left  . PACEMAKER PLACEMENT  2008   MDT dual chamber PPM implanted by Dr Caryl Comes for symptomatic bradycardia  . SKIN CANCER EXCISION    . TONSILLECTOMY AND ADENOIDECTOMY      Current Outpatient Prescriptions  Medication Sig Dispense Refill  . Calcium Carbonate-Vitamin D (OSCAL 500/200 D-3 PO) Take 1 tablet by mouth daily.     . Cholecalciferol (VITAMIN D PO) Take 2,000 Units by mouth daily.    . diclofenac sodium (VOLTAREN) 1 % GEL Apply 4 g topically 4 (four) times daily. 100 g 3  . FLUZONE HIGH-DOSE 0.5 ML injection TO BE ADMINISTERED BY PHARMACIST FOR IMMUNIZATION  0  . Multiple Vitamin (MULTI-VITAMIN PO) Take 1 tablet by mouth daily.     Marland Kitchen Zoster Vaccine Adjuvanted [facility]) injection inject 0.5 milliliter intramuscularly  0    No current facility-administered medications for this visit.     Allergies:   Sulfonamide derivatives and Penicillins   Social History:  The patient  reports that she has never smoked. She has never used smokeless tobacco. She reports that she does not drink alcohol or use drugs.   Family History:  The patient's family history includes Heart disease in her mother.  ROS:  Please see the history of present illness.  All other systems are reviewed and otherwise negative.   PHYSICAL EXAM:  VS:  BP 136/74   Pulse 87   Ht 5\' 5"  (1.651 m)   Wt 92 lb (41.7 kg)   BMI 15.31 kg/m  BMI: Body mass index is 15.31 kg/m. Well nourished, well developed though very thin body habitus, in no acute distress  HEENT: normocephalic, atraumatic  Neck: no JVD, carotid bruits or masses Cardiac: RRR; no significant murmurs, no rubs, or gallops Lungs:  CTA b/l, no wheezing, rhonchi or rales  Abd: soft, nontender MS: no deformity, age appropriate trophy Ext: no edema  Skin: warm and dry, no rash Neuro:  No gross deficits appreciated Psych: euthymic mood, full affect  PPM site is stable, no tethering or discomfort   EKG:  Done today  And reviewed by myself shows SR 87bpm, PR 156ms, QRS 36ms, QTc 464ms, Q V1-2 PPM interrogation done today and reviewed by  myself: battery has reached ERI, appears on/about 02/16/17, lead trends are stable, has converted to VVI programming, 2 HVR, no EGMs, 5 and 11 seconds, 1.3% VP  10/30/16 TTE Study Conclusions - Left ventricle: The cavity size was normal. Wall thickness was   normal. Systolic function was normal. The estimated ejection   fraction was in the range of 55% to 60%. Wall motion was normal;   there were no regional wall motion abnormalities. Doppler   parameters are consistent with abnormal left ventricular   relaxation (grade 1 diastolic dysfunction). - Aortic valve: Trileaflet; moderately calcified leaflets. There   was no stenosis. - Mitral valve:  Mildly calcified annulus. Mildly calcified   leaflets. The anterior leaflet had a hockey stick appearance   suggestive of rheumatic involvement. There was no evidence for   stenosis. There was trivial regurgitation. - Left atrium: The atrium was mildly dilated. - Right ventricle: The cavity size was normal. Pacer wire or   catheter noted in right ventricle. Systolic function was normal. - Tricuspid valve: Peak RV-RA gradient (S): 29 mm Hg. - Pulmonary arteries: PA peak pressure: 32 mm Hg (S). - Inferior vena cava: The vessel was normal in size. The   respirophasic diameter changes were in the normal range (>= 50%),   consistent with normal central venous pressure. - Pericardium, extracardiac: A trivial pericardial effusion was   identified. Impressions: - Normal LV size with EF 55-60%. Calcified, hockey stick-shaped   mitral valve appears rheumatic but no stenosis and trivial   regurgitation. Normal RV size and systolic function.  Recent Labs: 09/01/2016: ALT 6; BUN 17; Creat 0.79; Hemoglobin 14.9; Platelets 240; Potassium 4.1; Sodium 137  09/01/2016: Cholesterol 225; HDL 55; LDL Cholesterol 143; Total CHOL/HDL Ratio 4.1; Triglycerides 134; VLDL 27   CrCl cannot be calculated (Patient's most recent lab result is older than the maximum 21 days allowed.).   Wt Readings from Last 3 Encounters:  02/24/17 92 lb (41.7 kg)  10/17/16 91 lb (41.3 kg)  09/05/16 89 lb (40.4 kg)     Other studies reviewed: Additional studies/records reviewed today include: summarized above  ASSESSMENT AND PLAN:  1. PPM     Device has reached ERI     generator change procedure,  risks, benefits were discussed today and the patient would like to proceed  Plan for routine post gen change follow up   Disposition: as above  Current medicines are reviewed at length with the patient today.  The patient did not have any concerns regarding medicines.  Venetia Night, PA-C 02/24/2017 8:54 AM     [address] [place] [address] [phone] (office)  [phone] (fax)

## 2017-03-04 NOTE — Discharge Instructions (Signed)
Pacemaker Battery Change, Care After °This sheet gives you information about how to care for yourself after your procedure. Your health care provider may also give you more specific instructions. If you have problems or questions, contact your health care provider. °What can I expect after the procedure? °After your procedure, it is common to have: °· Pain or soreness at the site where the pacemaker was inserted. °· Swelling at the site where the pacemaker was inserted. ° °Follow these instructions at home: °Incision care °· Keep the incision clean and dry. °? Do not take baths, swim, or use a hot tub until your health care provider approves. °? You may shower the day after your procedure, or as directed by your health care provider. °? Pat the area dry with a clean towel. Do not rub the area. This may cause bleeding. °· Follow instructions from your health care provider about how to take care of your incision. Make sure you: °? Wash your hands with soap and water before you change your bandage (dressing). If soap and water are not available, use hand sanitizer. °? Change your dressing as told by your health care provider. °? Leave stitches (sutures), skin glue, or adhesive strips in place. These skin closures may need to stay in place for 2 weeks or longer. If adhesive strip edges start to loosen and curl up, you may trim the loose edges. Do not remove adhesive strips completely unless your health care provider tells you to do that. °· Check your incision area every day for signs of infection. Check for: °? More redness, swelling, or pain. °? More fluid or blood. °? Warmth. °? Pus or a bad smell. °Activity °· Do not lift anything that is heavier than 10 lb (4.5 kg) until your health care provider says it is okay to do so. °· For the first 2 weeks, or as long as told by your health care provider: °? Avoid lifting your left arm higher than your shoulder. °? Be gentle when you move your arms over your head. It is okay  to raise your arm to comb your hair. °? Avoid strenuous exercise. °· Ask your health care provider when it is okay to: °? Resume your normal activities. °? Return to work or school. °? Resume sexual activity. °Eating and drinking °· Eat a heart-healthy diet. This should include plenty of fresh fruits and vegetables, whole grains, low-fat dairy products, and lean protein like chicken and fish. °· Limit alcohol intake to no more than 1 drink a day for non-pregnant women and 2 drinks a day for men. One drink equals 12 oz of beer, 5 oz of wine, or 1½ oz of hard liquor. °· Check ingredients and nutrition facts on packaged foods and beverages. Avoid the following types of food: °? Food that is high in salt (sodium). °? Food that is high in saturated fat, like full-fat dairy or red meat. °? Food that is high in trans fat, like fried food. °? Food and drinks that are high in sugar. °Lifestyle °· Do not use any products that contain nicotine or tobacco, such as cigarettes and e-cigarettes. If you need help quitting, ask your health care provider. °· Take steps to manage and control your weight. °· Get regular exercise. Aim for 150 minutes of moderate-intensity exercise (such as walking or yoga) or 75 minutes of vigorous exercise (such as running or swimming) each week. °· Manage other health problems, such as diabetes or high blood pressure. Ask your health   care provider how you can manage these conditions. General instructions  Do not drive for 24 hours after your procedure if you were given a medicine to help you relax (sedative).  Take over-the-counter and prescription medicines only as told by your health care provider.  Avoid putting pressure on the area where the pacemaker was placed.  If you need an MRI after your pacemaker has been placed, be sure to tell the health care provider who orders the MRI that you have a pacemaker.  Avoid close and prolonged exposure to electrical devices that have strong  magnetic fields. These include: ? Cell phones. Avoid keeping them in a pocket near the pacemaker, and try using the ear opposite the pacemaker. ? MP3 players. ? Household appliances, like microwaves. ? Metal detectors. ? Electric generators. ? High-tension wires.  Keep all follow-up visits as directed by your health care provider. This is important. Contact a health care provider if:  You have pain at the incision site that is not relieved by over-the-counter or prescription medicines.  You have any of these around your incision site or coming from it: ? More redness, swelling, or pain. ? Fluid or blood. ? Warmth to the touch. ? Pus or a bad smell.  You have a fever.  You feel brief, occasional palpitations, light-headedness, or any symptoms that you think might be related to your heart. Get help right away if:  You experience chest pain that is different from the pain at the pacemaker site.  You develop a red streak that extends above or below the incision site.  You experience shortness of breath.  You have palpitations or an irregular heartbeat.  You have light-headedness that does not go away quickly.  You faint or have dizzy spells.  Your pulse suddenly drops or increases rapidly and does not return to normal.  You begin to gain weight and your legs and ankles swell. Summary  After your procedure, it is common to have pain, soreness, and some swelling where the pacemaker was inserted.  Make sure to keep your incision clean and dry. Follow instructions from your health care provider about how to take care of your incision.  Check your incision every day for signs of infection, such as more pain or swelling, pus or a bad smell, warmth, or leaking fluid and blood.  Avoid strenuous exercise and lifting your left arm higher than your shoulder for 2 weeks, or as long as told by your health care provider. This information is not intended to replace advice given to you by  your health care provider. Make sure you discuss any questions you have with your health care provider. Document Released: 02/16/2013 Document Revised: 03/20/2016 Document Reviewed: 03/20/2016 Elsevier Interactive Patient Education  2017 [employer].    Tissue Adhesive Wound Care Some cuts and wounds can be closed with skin glue (tissue adhesive). Skin glue holds the skin together and helps your wound heal faster. Skin glue goes away on its own as your wound gets better. Follow these instructions at home: Wound care   Showers are allowed 24 hours after treatment. Do not soak the wound in water. Do not take baths, swim, or use hot tubs. Do not use soaps or creams on your wound.  If a bandage (dressing) was put on the wound: ? Wash your hands with soap and water before you change your bandage. ? Change the bandage as often as told by your doctor. ? Leave skin glue in place. It will fall  off on its own after 7-10 days. ? Keep the bandage dry.  Do not scratch, rub, or pick at the skin glue.  Do not put tape over the skin glue. The skin glue could come off when you take the tape off.  Protect the wound from another injury.  Protect the wound from sun and tanning beds. General instructions  Take over-the-counter and prescription medicines only as told by your doctor.  Keep all follow-up visits as told by your doctor. This is important. Get help right away if:  Your wound is red, puffy (swollen), hot, or tender.  You get a rash after the glue is put on.  You have more pain in the wound.  You have a red streak going away from the wound.  You have yellowish-white fluid (pus) coming from the wound.  You have more bleeding.  You have a fever.  You have chills and you start to shake.  You notice a bad smell coming from the wound.  Your wound or skin glue breaks open. This information is not intended to replace advice given to you by your health care provider. Make sure you  discuss any questions you have with your health care provider. Document Released: 02/05/2008 Document Revised: 03/21/2016 Document Reviewed: 03/21/2016 Elsevier Interactive Patient Education  2017 [employer].

## 2017-03-18 ENCOUNTER — Ambulatory Visit (INDEPENDENT_AMBULATORY_CARE_PROVIDER_SITE_OTHER): Payer: Medicare Other | Admitting: *Deleted

## 2017-03-18 DIAGNOSIS — I495 Sick sinus syndrome: Secondary | ICD-10-CM

## 2017-03-18 LAB — CUP PACEART INCLINIC DEVICE CHECK
Battery Impedance: 100 Ohm
Battery Remaining Longevity: 146 mo
Battery Voltage: 2.79 V
Brady Statistic AS VP Percent: 1 %
Date Time Interrogation Session: 20181107092207
Implantable Lead Implant Date: 20080703
Implantable Lead Location: 753859
Implantable Lead Model: 5076
Implantable Pulse Generator Implant Date: 20181024
Lead Channel Pacing Threshold Amplitude: 0.625 V
Lead Channel Pacing Threshold Pulse Width: 0.4 ms
Lead Channel Pacing Threshold Pulse Width: 0.4 ms
Lead Channel Pacing Threshold Pulse Width: 0.4 ms
Lead Channel Sensing Intrinsic Amplitude: 15.67 mV
Lead Channel Setting Pacing Amplitude: 1.5 V
MDC IDC LEAD IMPLANT DT: 20080703
MDC IDC LEAD LOCATION: 753860
MDC IDC MSMT LEADCHNL RA IMPEDANCE VALUE: 577 Ohm
MDC IDC MSMT LEADCHNL RA PACING THRESHOLD AMPLITUDE: 0.5 V
MDC IDC MSMT LEADCHNL RA SENSING INTR AMPL: 2.8 mV
MDC IDC MSMT LEADCHNL RV IMPEDANCE VALUE: 609 Ohm
MDC IDC MSMT LEADCHNL RV PACING THRESHOLD AMPLITUDE: 0.5 V
MDC IDC MSMT LEADCHNL RV PACING THRESHOLD AMPLITUDE: 0.5 V
MDC IDC MSMT LEADCHNL RV PACING THRESHOLD PULSEWIDTH: 0.4 ms
MDC IDC SET LEADCHNL RV PACING AMPLITUDE: 2.5 V
MDC IDC SET LEADCHNL RV PACING PULSEWIDTH: 0.4 ms
MDC IDC SET LEADCHNL RV SENSING SENSITIVITY: 5.6 mV
MDC IDC STAT BRADY AP VP PERCENT: 0 %
MDC IDC STAT BRADY AP VS PERCENT: 1 %
MDC IDC STAT BRADY AS VS PERCENT: 98 %

## 2017-03-18 NOTE — Progress Notes (Signed)
Wound check appointment. Dermabond removed. Wound without redness or edema. Incision edges approximated, wound well healed. Normal device function. Thresholds, sensing, and impedances consistent with implant measurements. Device programmed at chronic output values, Histogram distribution appropriate for patient and level of activity. 2 AHR~ EGMS ST/AT. Peak A 150bpm longest 64min45sec.No  high ventricular rates noted. Patient educated about wound care, arm mobility, lifting restrictions. ROV 06/08/2017 w/ SK

## 2017-06-08 ENCOUNTER — Ambulatory Visit: Payer: Medicare Other | Admitting: Internal Medicine

## 2017-06-08 ENCOUNTER — Encounter: Payer: Self-pay | Admitting: Internal Medicine

## 2017-06-08 VITALS — BP 120/80 | HR 79 | Ht 64.0 in | Wt 93.0 lb

## 2017-06-08 DIAGNOSIS — I495 Sick sinus syndrome: Secondary | ICD-10-CM

## 2017-06-08 DIAGNOSIS — Z950 Presence of cardiac pacemaker: Secondary | ICD-10-CM

## 2017-06-08 LAB — CUP PACEART INCLINIC DEVICE CHECK
Battery Voltage: 2.78 V
Brady Statistic AP VS Percent: 2 %
Brady Statistic AS VP Percent: 15 %
Brady Statistic AS VS Percent: 82 %
Date Time Interrogation Session: 20190128095242
Implantable Lead Implant Date: 20080703
Implantable Lead Implant Date: 20080703
Implantable Lead Location: 753860
Lead Channel Impedance Value: 569 Ohm
Lead Channel Impedance Value: 661 Ohm
Lead Channel Pacing Threshold Amplitude: 0.5 V
Lead Channel Pacing Threshold Amplitude: 0.5 V
Lead Channel Pacing Threshold Amplitude: 0.625 V
Lead Channel Pacing Threshold Pulse Width: 0.4 ms
Lead Channel Pacing Threshold Pulse Width: 0.4 ms
Lead Channel Sensing Intrinsic Amplitude: 15.67 mV
Lead Channel Sensing Intrinsic Amplitude: 2.8 mV
Lead Channel Setting Pacing Amplitude: 1.5 V
Lead Channel Setting Pacing Amplitude: 2.5 V
Lead Channel Setting Sensing Sensitivity: 5.6 mV
MDC IDC LEAD LOCATION: 753859
MDC IDC MSMT BATTERY IMPEDANCE: 100 Ohm
MDC IDC MSMT BATTERY REMAINING LONGEVITY: 142 mo
MDC IDC MSMT LEADCHNL RA PACING THRESHOLD AMPLITUDE: 0.75 V
MDC IDC MSMT LEADCHNL RA PACING THRESHOLD PULSEWIDTH: 0.4 ms
MDC IDC MSMT LEADCHNL RA PACING THRESHOLD PULSEWIDTH: 0.4 ms
MDC IDC PG IMPLANT DT: 20181024
MDC IDC SET LEADCHNL RV PACING PULSEWIDTH: 0.4 ms
MDC IDC STAT BRADY AP VP PERCENT: 1 %

## 2017-06-08 NOTE — Patient Instructions (Addendum)
Medication Instructions:  Your physician recommends that you continue on your current medications as directed. Please refer to the Current Medication list given to you today.   Labwork: None ordered.  Testing/Procedures: None ordered.  Follow-Up: Your physician wants you to follow-up in: 9 Months with Tyson Babinski, NP You will receive a reminder letter in the mail two months in advance. If you don't receive a letter, please call our office to schedule the follow-up appointment.  Remote monitoring is used to monitor your Pacemaker from home. This monitoring reduces the number of office visits required to check your device to one time per year. It allows [place] to keep an eye on the functioning of your device to ensure it is working properly. You are scheduled for a device check from home on 09/07/2017. You may send your transmission at any time that day. If you have a wireless device, the transmission will be sent automatically. After your physician reviews your transmission, you will receive a postcard with your next transmission date.     Any Other Special Instructions Will Be Listed Below (If Applicable).     If you need a refill on your cardiac medications before your next appointment, please call your pharmacy.

## 2017-06-08 NOTE — Progress Notes (Signed)
Patient Care Team: Gayland Curry, DO as PCP - General (Geriatric Medicine) Marygrace Drought, MD as Consulting Physician (Ophthalmology)   HPI  Alexandria Wright is a 84 y.o. female seen in followup for syncope, bradycardia, and status post pacemaker implantation in October 2008.  She underwent generator replacement 10/18.  She received a Medtronic Adapta device which is now on advisory recall.  She also had nonsustained ventricular tachycardia and PVCs  They have been asymptomatic   She denies chest pain shortness of breath or peripheral edema.  No issues related to her recent wound      Records and Results Reviewed   Past Medical History:  Diagnosis Date  . Bradycardia    a. s/p MDT dual chamber PPM   . Esophageal stricture   . GERD (gastroesophageal reflux disease)   . Skin cancer   . Syncope   . Tachycardia    asyptomatic nonsustained    Past Surgical History:  Procedure Laterality Date  . CATARACT EXTRACTION  12/10/2011   right   . CATARACT EXTRACTION  12/17/2011   left  . PACEMAKER PLACEMENT  2008   MDT dual chamber PPM implanted by Dr Caryl Comes for symptomatic bradycardia  . PPM GENERATOR CHANGEOUT N/A 03/04/2017   Procedure: PPM GENERATOR CHANGEOUT;  Surgeon: Deboraha Sprang, MD;  Location: [place] [facility];  Service: Cardiovascular;  Laterality: N/A;  . SKIN CANCER EXCISION    . TONSILLECTOMY AND ADENOIDECTOMY      Current Outpatient Medications  Medication Sig Dispense Refill  . Calcium Carbonate-Vitamin D (OSCAL 500/200 D-3 PO) Take 1 tablet by mouth daily.     . Cholecalciferol (VITAMIN D3) 2000 units TABS Take 2,000 Units by mouth daily.    . Misc Natural Product [facility] EYE DROPS #2 OP) Place 1 drop into both eyes daily as needed (for blurry eyes/irritation.). Complete Eye Relief    . Multiple Vitamin (MULTIVITAMIN WITH MINERALS) TABS tablet Take 1 tablet by mouth daily with breakfast.     No current facility-administered medications for  this visit.     Allergies  Allergen Reactions  . Sulfonamide Derivatives Other (See Comments)    Made very sick, per pt   . Penicillins Swelling and Rash    Has patient had a PCN reaction causing immediate rash, facial/tongue/throat swelling, SOB or lightheadedness with hypotension: Yes Has patient had a PCN reaction causing severe rash involving mucus membranes or skin necrosis: Yes Has patient had a PCN reaction that required hospitalization: No Has patient had a PCN reaction occurring within the last 10 years: No If all of the above answers are "NO", then may proceed with Cephalosporin use.       Review of Systems negative except from HPI and PMH  Physical Exam BP 120/80   Pulse 79   Ht 5\' 4"  (1.626 m)   Wt 93 lb (42.2 kg)   BMI 15.96 kg/m  Well developed and nourished in no acute distress HENT normal Neck supple with JVP-flat Carotids brisk and full without bruits Clear Regular rate and rhythm, no murmurs or gallops Abd-soft with active BS without hepatomegaly No Clubbing cyanosis edema Skin-warm and dry A & Oriented  Grossly normal sensory and motor function   ECG atrial pacing at 79 Intervals 15/08/38 Otherwise normal Assessment and  Plan  Assessment and  Plan  Sinus node dysfunction-intermittent  PVCs  Pacemaker Medtronic  The patient's device was interrogated.  The information was reviewed. No changes   PVC  burden by device matrix is about 1.5 %  Heart rate excursion is adequate  Plan follow-up in 9 months      Current medicines are reviewed at length with the patient today .  The patient does not  have concerns regarding medicines.

## 2017-07-08 ENCOUNTER — Encounter: Payer: Self-pay | Admitting: Internal Medicine

## 2017-07-08 ENCOUNTER — Ambulatory Visit (INDEPENDENT_AMBULATORY_CARE_PROVIDER_SITE_OTHER): Payer: Medicare Other | Admitting: Internal Medicine

## 2017-07-08 VITALS — BP 126/66 | HR 89 | Ht 64.0 in | Wt 98.4 lb

## 2017-07-08 DIAGNOSIS — I495 Sick sinus syndrome: Secondary | ICD-10-CM | POA: Diagnosis not present

## 2017-07-08 DIAGNOSIS — Z950 Presence of cardiac pacemaker: Secondary | ICD-10-CM | POA: Diagnosis not present

## 2017-07-08 NOTE — Patient Instructions (Signed)
Medication Instructions:  Your physician recommends that you continue on your current medications as directed. Please refer to the Current Medication list given to you today.  Labwork: None ordered.  Testing/Procedures: None ordered.  Follow-Up: Your physician recommends that you schedule a follow-up appointment in:   October with Tommye Standard, PA  Remote monitoring is used to monitor your Pacemaker from home. This monitoring reduces the number of office visits required to check your device to one time per year. It allows [place] to keep an eye on the functioning of your device to ensure it is working properly. You are scheduled for a device check from home on 09/07/2017. You may send your transmission at any time that day. If you have a wireless device, the transmission will be sent automatically. After your physician reviews your transmission, you will receive a postcard with your next transmission date.    Any Other Special Instructions Will Be Listed Below (If Applicable).     If you need a refill on your cardiac medications before your next appointment, please call your pharmacy.

## 2017-07-08 NOTE — Progress Notes (Signed)
Patient Care Team: Gayland Curry, DO as PCP - General (Geriatric Medicine) Marygrace Drought, MD as Consulting Physician (Ophthalmology)   HPI  Alexandria Wright is a 84 y.o. female seen in followup for syncope, bradycardia, and status post pacemaker implantation in October 2008.  She underwent generator replacement 10/18.  She received a Medtronic Adapta device which is now on advisory recall.  The patient denies chest pain, shortness of breath, nocturnal dyspnea, orthopnea or peripheral edema.  There have been no palpitations, lightheadedness or syncope.   Records and Results Reviewed   Past Medical History:  Diagnosis Date  . Bradycardia    a. s/p MDT dual chamber PPM   . Esophageal stricture   . GERD (gastroesophageal reflux disease)   . Skin cancer   . Syncope   . Tachycardia    asyptomatic nonsustained    Past Surgical History:  Procedure Laterality Date  . CATARACT EXTRACTION  12/10/2011   right   . CATARACT EXTRACTION  12/17/2011   left  . PACEMAKER PLACEMENT  2008   MDT dual chamber PPM implanted by Dr Caryl Comes for symptomatic bradycardia  . PPM GENERATOR CHANGEOUT N/A 03/04/2017   Procedure: PPM GENERATOR CHANGEOUT;  Surgeon: Deboraha Sprang, MD;  Location: [place] [facility];  Service: Cardiovascular;  Laterality: N/A;  . SKIN CANCER EXCISION    . TONSILLECTOMY AND ADENOIDECTOMY      Current Outpatient Medications  Medication Sig Dispense Refill  . Calcium Carbonate-Vitamin D (OSCAL 500/200 D-3 PO) Take 1 tablet by mouth daily.     . Cholecalciferol (VITAMIN D3) 2000 units TABS Take 2,000 Units by mouth daily.    . Misc Natural Product [facility] EYE DROPS #2 OP) Place 1 drop into both eyes daily as needed (for blurry eyes/irritation.). Complete Eye Relief    . Multiple Vitamin (MULTIVITAMIN WITH MINERALS) TABS tablet Take 1 tablet by mouth daily with breakfast.     No current facility-administered medications for this visit.     Allergies  Allergen  Reactions  . Sulfonamide Derivatives Other (See Comments)    Made very sick, per pt   . Penicillins Swelling and Rash    Has patient had a PCN reaction causing immediate rash, facial/tongue/throat swelling, SOB or lightheadedness with hypotension: Yes Has patient had a PCN reaction causing severe rash involving mucus membranes or skin necrosis: Yes Has patient had a PCN reaction that required hospitalization: No Has patient had a PCN reaction occurring within the last 10 years: No If all of the above answers are "NO", then may proceed with Cephalosporin use.       Review of Systems negative except from HPI and PMH  Physical Exam BP 126/66   Pulse 89   Ht 5\' 4"  (1.626 m)   Wt 98 lb 6.4 oz (44.6 kg)   SpO2 99%   BMI 16.89 kg/m  Well developed and nourished in no acute distress HENT normal Clear Regular rate and rhythm, no murmurs or gallops No Clubbing cyanosis edema Skin-warm and dry A & Oriented  Grossly normal sensory and motor function      Assessment and  Plan  Sinus node dysfunction-intermittent  PVCs  Pacemaker Medtronic  The patient's device was interrogated.  The information was reviewed. No changes   Pacemaker on advisory recall  Device is on advisory recall.  She has sinus rhythm with intrinsic conduction. No interval changes necessary  Expect patch available in the openfall    Current medicines are  reviewed at length with the patient today .  The patient does not  have concerns regarding medicines.

## 2017-08-01 LAB — CUP PACEART INCLINIC DEVICE CHECK
Battery Impedance: 104 Ohm
Battery Voltage: 2.78 V
Brady Statistic AP VS Percent: 3 %
Brady Statistic AS VS Percent: 82 %
Date Time Interrogation Session: 20190227150407
Implantable Lead Location: 753860
Implantable Lead Model: 5076
Lead Channel Impedance Value: 559 Ohm
Lead Channel Sensing Intrinsic Amplitude: 2.8 mV
Lead Channel Setting Pacing Amplitude: 2.5 V
Lead Channel Setting Pacing Pulse Width: 0.4 ms
MDC IDC LEAD IMPLANT DT: 20080703
MDC IDC LEAD IMPLANT DT: 20080703
MDC IDC LEAD LOCATION: 753859
MDC IDC MSMT BATTERY REMAINING LONGEVITY: 141 mo
MDC IDC MSMT LEADCHNL RA PACING THRESHOLD AMPLITUDE: 1 V
MDC IDC MSMT LEADCHNL RA PACING THRESHOLD PULSEWIDTH: 0.4 ms
MDC IDC MSMT LEADCHNL RV IMPEDANCE VALUE: 644 Ohm
MDC IDC MSMT LEADCHNL RV PACING THRESHOLD AMPLITUDE: 1 V
MDC IDC MSMT LEADCHNL RV PACING THRESHOLD PULSEWIDTH: 0.4 ms
MDC IDC MSMT LEADCHNL RV SENSING INTR AMPL: 15.67 mV
MDC IDC PG IMPLANT DT: 20181024
MDC IDC SET LEADCHNL RA PACING AMPLITUDE: 1.5 V
MDC IDC SET LEADCHNL RV SENSING SENSITIVITY: 5.6 mV
MDC IDC STAT BRADY AP VP PERCENT: 1 %
MDC IDC STAT BRADY AS VP PERCENT: 15 %

## 2017-09-07 ENCOUNTER — Ambulatory Visit (INDEPENDENT_AMBULATORY_CARE_PROVIDER_SITE_OTHER): Payer: Medicare Other | Admitting: *Deleted

## 2017-09-07 DIAGNOSIS — I495 Sick sinus syndrome: Secondary | ICD-10-CM | POA: Diagnosis not present

## 2017-09-07 NOTE — Progress Notes (Signed)
Remote pacemaker transmission.   

## 2017-09-08 ENCOUNTER — Encounter: Payer: Self-pay | Admitting: Cardiology

## 2017-09-08 NOTE — Addendum Note (Signed)
Addended by: Lurena Nida on: 09/08/2017 08:49 AM   Modules accepted: Orders

## 2017-09-09 ENCOUNTER — Other Ambulatory Visit: Payer: Medicare Other

## 2017-09-09 DIAGNOSIS — E7800 Pure hypercholesterolemia, unspecified: Secondary | ICD-10-CM

## 2017-09-09 DIAGNOSIS — K219 Gastro-esophageal reflux disease without esophagitis: Secondary | ICD-10-CM

## 2017-09-09 DIAGNOSIS — I10 Essential (primary) hypertension: Secondary | ICD-10-CM

## 2017-09-09 DIAGNOSIS — Z0000 Encounter for general adult medical examination without abnormal findings: Secondary | ICD-10-CM

## 2017-09-09 DIAGNOSIS — K222 Esophageal obstruction: Secondary | ICD-10-CM

## 2017-09-09 DIAGNOSIS — R636 Underweight: Secondary | ICD-10-CM

## 2017-09-09 LAB — CUP PACEART REMOTE DEVICE CHECK
Battery Impedance: 129 Ohm
Battery Voltage: 2.78 V
Brady Statistic AP VP Percent: 1 %
Brady Statistic AS VP Percent: 16 %
Implantable Lead Implant Date: 20080703
Implantable Lead Implant Date: 20080703
Implantable Lead Location: 753860
Implantable Lead Model: 5076
Implantable Pulse Generator Implant Date: 20181024
Lead Channel Impedance Value: 543 Ohm
Lead Channel Pacing Threshold Amplitude: 0.625 V
Lead Channel Pacing Threshold Amplitude: 0.625 V
Lead Channel Setting Pacing Amplitude: 2.5 V
MDC IDC LEAD LOCATION: 753859
MDC IDC MSMT BATTERY REMAINING LONGEVITY: 134 mo
MDC IDC MSMT LEADCHNL RA PACING THRESHOLD PULSEWIDTH: 0.4 ms
MDC IDC MSMT LEADCHNL RV IMPEDANCE VALUE: 623 Ohm
MDC IDC MSMT LEADCHNL RV PACING THRESHOLD PULSEWIDTH: 0.4 ms
MDC IDC SESS DTM: 20190429120802
MDC IDC SET LEADCHNL RA PACING AMPLITUDE: 1.5 V
MDC IDC SET LEADCHNL RV PACING PULSEWIDTH: 0.4 ms
MDC IDC SET LEADCHNL RV SENSING SENSITIVITY: 5.6 mV
MDC IDC STAT BRADY AP VS PERCENT: 3 %
MDC IDC STAT BRADY AS VS PERCENT: 80 %

## 2017-09-09 LAB — CBC WITH DIFFERENTIAL/PLATELET
Basophils Absolute: 49 cells/uL (ref 0–200)
Basophils Relative: 0.8 %
Eosinophils Absolute: 153 cells/uL (ref 15–500)
Eosinophils Relative: 2.5 %
HCT: 43.6 % (ref 35.0–45.0)
Hemoglobin: 14.8 g/dL (ref 11.7–15.5)
Lymphs Abs: 885 cells/uL (ref 850–3900)
MCH: 30.8 pg (ref 27.0–33.0)
MCHC: 33.9 g/dL (ref 32.0–36.0)
MCV: 90.8 fL (ref 80.0–100.0)
MPV: 10.2 fL (ref 7.5–12.5)
Monocytes Relative: 11.2 %
Neutro Abs: 4331 cells/uL (ref 1500–7800)
Neutrophils Relative %: 71 %
Platelets: 206 10*3/uL (ref 140–400)
RBC: 4.8 10*6/uL (ref 3.80–5.10)
RDW: 12.9 % (ref 11.0–15.0)
Total Lymphocyte: 14.5 %
WBC mixed population: 683 cells/uL (ref 200–950)
WBC: 6.1 10*3/uL (ref 3.8–10.8)

## 2017-09-09 LAB — TSH: TSH: 2.41 mIU/L (ref 0.40–4.50)

## 2017-09-09 LAB — LIPID PANEL
Cholesterol: 214 mg/dL — ABNORMAL HIGH (ref <200)
HDL: 70 mg/dL (ref >50)
LDL Cholesterol (Calc): 124 mg/dL (calc) — ABNORMAL HIGH
Non-HDL Cholesterol (Calc): 144 mg/dL (calc) — ABNORMAL HIGH (ref <130)
Total CHOL/HDL Ratio: 3.1 (calc) (ref <5.0)
Triglycerides: 98 mg/dL (ref <150)

## 2017-09-09 LAB — COMPLETE METABOLIC PANEL WITH GFR
AG Ratio: 1.7 (calc) (ref 1.0–2.5)
ALT: 8 U/L (ref 6–29)
AST: 16 U/L (ref 10–35)
Albumin: 4.3 g/dL (ref 3.6–5.1)
Alkaline phosphatase (APISO): 61 U/L (ref 33–130)
BUN: 16 mg/dL (ref 7–25)
CO2: 28 mmol/L (ref 20–32)
Calcium: 9.3 mg/dL (ref 8.6–10.4)
Chloride: 102 mmol/L (ref 98–110)
Creat: 0.86 mg/dL (ref 0.60–0.88)
GFR, Est African American: 71 mL/min/{1.73_m2} (ref >=60)
GFR, Est Non African American: 62 mL/min/{1.73_m2} (ref >=60)
Globulin: 2.5 g/dL (calc) (ref 1.9–3.7)
Glucose, Bld: 89 mg/dL (ref 65–99)
Potassium: 3.9 mmol/L (ref 3.5–5.3)
Sodium: 139 mmol/L (ref 135–146)
Total Bilirubin: 0.7 mg/dL (ref 0.2–1.2)
Total Protein: 6.8 g/dL (ref 6.1–8.1)

## 2017-09-11 ENCOUNTER — Encounter: Payer: Self-pay | Admitting: Internal Medicine

## 2017-09-14 ENCOUNTER — Encounter: Payer: Self-pay | Admitting: Internal Medicine

## 2017-09-21 ENCOUNTER — Ambulatory Visit: Payer: Medicare Other | Admitting: Internal Medicine

## 2017-09-21 ENCOUNTER — Ambulatory Visit (INDEPENDENT_AMBULATORY_CARE_PROVIDER_SITE_OTHER): Payer: Medicare Other

## 2017-09-21 ENCOUNTER — Encounter: Payer: Self-pay | Admitting: Internal Medicine

## 2017-09-21 VITALS — BP 138/74 | HR 82 | Temp 97.8°F | Ht 64.0 in | Wt 93.6 lb

## 2017-09-21 VITALS — BP 138/74 | HR 82 | Temp 97.8°F | Ht 64.0 in | Wt 96.0 lb

## 2017-09-21 DIAGNOSIS — Z681 Body mass index (BMI) 19 or less, adult: Secondary | ICD-10-CM | POA: Diagnosis not present

## 2017-09-21 DIAGNOSIS — M810 Age-related osteoporosis without current pathological fracture: Secondary | ICD-10-CM

## 2017-09-21 DIAGNOSIS — K222 Esophageal obstruction: Secondary | ICD-10-CM | POA: Diagnosis not present

## 2017-09-21 DIAGNOSIS — Z0000 Encounter for general adult medical examination without abnormal findings: Secondary | ICD-10-CM

## 2017-09-21 DIAGNOSIS — R636 Underweight: Secondary | ICD-10-CM | POA: Diagnosis not present

## 2017-09-21 DIAGNOSIS — C44519 Basal cell carcinoma of skin of other part of trunk: Secondary | ICD-10-CM

## 2017-09-21 DIAGNOSIS — H6123 Impacted cerumen, bilateral: Secondary | ICD-10-CM | POA: Diagnosis not present

## 2017-09-21 DIAGNOSIS — I495 Sick sinus syndrome: Secondary | ICD-10-CM | POA: Diagnosis not present

## 2017-09-21 DIAGNOSIS — Z950 Presence of cardiac pacemaker: Secondary | ICD-10-CM | POA: Diagnosis not present

## 2017-09-21 DIAGNOSIS — I10 Essential (primary) hypertension: Secondary | ICD-10-CM

## 2017-09-21 NOTE — Patient Instructions (Signed)
Alexandria Wright , Thank you for taking time to come for your Medicare Wellness Visit. I appreciate your ongoing commitment to your health goals. Please review the following plan we discussed and let me know if I can assist you in the future.   Screening recommendations/referrals: Colonoscopy excluded, over age 82 Mammogram excluded, over age 73 Bone Density up to date Recommended yearly ophthalmology/optometry visit for glaucoma screening and checkup Recommended yearly dental visit for hygiene and checkup  Vaccinations: Influenza vaccine up to date, due 2019 fall season Pneumococcal vaccine up to date, completed Tdap vaccine up to date, due 07/10/2021 Shingles vaccine up to date, completed    Advanced directives: Please bring in living will and health care power of attorney   Conditions/risks identified: none  Next appointment: [name], RN 09/24/2018 @ 8:30am   Preventive Care 65 Years and Older, Female Preventive care refers to lifestyle choices and visits with your health care provider that can promote health and wellness. What does preventive care include?  A yearly physical exam. This is also called an annual well check.  Dental exams once or twice a year.  Routine eye exams. Ask your health care provider how often you should have your eyes checked.  Personal lifestyle choices, including:  Daily care of your teeth and gums.  Regular physical activity.  Eating a healthy diet.  Avoiding tobacco and drug use.  Limiting alcohol use.  Practicing safe sex.  Taking low-dose aspirin every day.  Taking vitamin and mineral supplements as recommended by your health care provider. What happens during an annual well check? The services and screenings done by your health care provider during your annual well check will depend on your age, overall health, lifestyle risk factors, and family history of disease. Counseling  Your health care provider may ask you questions about  your:  Alcohol use.  Tobacco use.  Drug use.  Emotional well-being.  Home and relationship well-being.  Sexual activity.  Eating habits.  History of falls.  Memory and ability to understand (cognition).  Work and work Statistician.  Reproductive health. Screening  You may have the following tests or measurements:  Height, weight, and BMI.  Blood pressure.  Lipid and cholesterol levels. These may be checked every 5 years, or more frequently if you are over 1 years old.  Skin check.  Lung cancer screening. You may have this screening every year starting at age 60 if you have a 30-pack-year history of smoking and currently smoke or have quit within the past 15 years.  Fecal occult blood test (FOBT) of the stool. You may have this test every year starting at age 44.  Flexible sigmoidoscopy or colonoscopy. You may have a sigmoidoscopy every 5 years or a colonoscopy every 10 years starting at age 54.  Hepatitis C blood test.  Hepatitis B blood test.  Sexually transmitted disease (STD) testing.  Diabetes screening. This is done by checking your blood sugar (glucose) after you have not eaten for a while (fasting). You may have this done every 1-3 years.  Bone density scan. This is done to screen for osteoporosis. You may have this done starting at age 76.  Mammogram. This may be done every 1-2 years. Talk to your health care provider about how often you should have regular mammograms. Talk with your health care provider about your test results, treatment options, and if necessary, the need for more tests. Vaccines  Your health care provider may recommend certain vaccines, such as:  Influenza vaccine.  This is recommended every year.  Tetanus, diphtheria, and acellular pertussis (Tdap, Td) vaccine. You may need a Td booster every 10 years.  Zoster vaccine. You may need this after age 68.  Pneumococcal 13-valent conjugate (PCV13) vaccine. One dose is recommended  after age 76.  Pneumococcal polysaccharide (PPSV23) vaccine. One dose is recommended after age 20. Talk to your health care provider about which screenings and vaccines you need and how often you need them. This information is not intended to replace advice given to you by your health care provider. Make sure you discuss any questions you have with your health care provider. Document Released: 05/25/2015 Document Revised: 01/16/2016 Document Reviewed: 02/27/2015 Elsevier Interactive Patient Education  2017 [employer] Prevention in the Home Falls can cause injuries. They can happen to people of all ages. There are many things you can do to make your home safe and to help prevent falls. What can I do on the outside of my home?  Regularly fix the edges of walkways and driveways and fix any cracks.  Remove anything that might make you trip as you walk through a door, such as a raised step or threshold.  Trim any bushes or trees on the path to your home.  Use bright outdoor lighting.  Clear any walking paths of anything that might make someone trip, such as rocks or tools.  Regularly check to see if handrails are loose or broken. Make sure that both sides of any steps have handrails.  Any raised decks and porches should have guardrails on the edges.  Have any leaves, snow, or ice cleared regularly.  Use sand or salt on walking paths during winter.  Clean up any spills in your garage right away. This includes oil or grease spills. What can I do in the bathroom?  Use night lights.  Install grab bars by the toilet and in the tub and shower. Do not use towel bars as grab bars.  Use non-skid mats or decals in the tub or shower.  If you need to sit down in the shower, use a plastic, non-slip stool.  Keep the floor dry. Clean up any water that spills on the floor as soon as it happens.  Remove soap buildup in the tub or shower regularly.  Attach bath mats securely with  double-sided non-slip rug tape.  Do not have throw rugs and other things on the floor that can make you trip. What can I do in the bedroom?  Use night lights.  Make sure that you have a light by your bed that is easy to reach.  Do not use any sheets or blankets that are too big for your bed. They should not hang down onto the floor.  Have a firm chair that has side arms. You can use this for support while you get dressed.  Do not have throw rugs and other things on the floor that can make you trip. What can I do in the kitchen?  Clean up any spills right away.  Avoid walking on wet floors.  Keep items that you use a lot in easy-to-reach places.  If you need to reach something above you, use a strong step stool that has a grab bar.  Keep electrical cords out of the way.  Do not use floor polish or wax that makes floors slippery. If you must use wax, use non-skid floor wax.  Do not have throw rugs and other things on the floor that can make  you trip. What can I do with my stairs?  Do not leave any items on the stairs.  Make sure that there are handrails on both sides of the stairs and use them. Fix handrails that are broken or loose. Make sure that handrails are as long as the stairways.  Check any carpeting to make sure that it is firmly attached to the stairs. Fix any carpet that is loose or worn.  Avoid having throw rugs at the top or bottom of the stairs. If you do have throw rugs, attach them to the floor with carpet tape.  Make sure that you have a light switch at the top of the stairs and the bottom of the stairs. If you do not have them, ask someone to add them for you. What else can I do to help prevent falls?  Wear shoes that:  Do not have high heels.  Have rubber bottoms.  Are comfortable and fit you well.  Are closed at the toe. Do not wear sandals.  If you use a stepladder:  Make sure that it is fully opened. Do not climb a closed stepladder.  Make  sure that both sides of the stepladder are locked into place.  Ask someone to hold it for you, if possible.  Clearly mark and make sure that you can see:  Any grab bars or handrails.  First and last steps.  Where the edge of each step is.  Use tools that help you move around (mobility aids) if they are needed. These include:  Canes.  Walkers.  Scooters.  Crutches.  Turn on the lights when you go into a dark area. Replace any light bulbs as soon as they burn out.  Set up your furniture so you have a clear path. Avoid moving your furniture around.  If any of your floors are uneven, fix them.  If there are any pets around you, be aware of where they are.  Review your medicines with your doctor. Some medicines can make you feel dizzy. This can increase your chance of falling. Ask your doctor what other things that you can do to help prevent falls. This information is not intended to replace advice given to you by your health care provider. Make sure you discuss any questions you have with your health care provider. Document Released: 02/22/2009 Document Revised: 10/04/2015 Document Reviewed: 06/02/2014 Elsevier Interactive Patient Education  2017 [employer].

## 2017-09-21 NOTE — Progress Notes (Signed)
Provider:  Rexene Edison. Mariea Clonts, D.O., C.M.D. Location:   [facility]   Place of Service:   clinic  Previous PCP: Gayland Curry, DO Patient Care Team: Gayland Curry, DO as PCP - General (Geriatric Medicine) Marygrace Drought, MD as Consulting Physician (Ophthalmology)  Extended Emergency Contact Information Primary Emergency Contact: Servando Snare of [place] Phone: [phone] Relation: Sister  Code Status: DNR Goals of Care: Advanced Directive information Advanced Directives 09/21/2017  Does Patient Have a Medical Advance Directive? Yes  Type of Advance Directive Out of facility DNR (pink MOST or yellow form)  Does patient want to make changes to medical advance directive? No - Patient declined  Copy of [place] in Chart? -  Pre-existing out of facility DNR order (yellow form or pink MOST form) Pink MOST form placed in chart (order not valid for inpatient use);Yellow form placed in chart (order not valid for inpatient use)    Chief Complaint  Patient presents with  . Annual Exam    CPE    HPI: Patient is a 84 y.o. female seen today for an annual physical exam.   Since her last visit, she had a pacemaker generator change.  Her last remote pacemaker check was 09/06/17.  She says there was a recall on her pacemaker and she needs to have it reprogrammed.    Otherwise, she has not had any visits in Epic since her annual exam 09/05/16.    She did see dermatology, Dr. Elvera Lennox ([facility] on [address]).  She had 11 surgeries and spray stuff (efludex on face).  Sees him every 3 mos.  One on her back was a basal cell.    Sees Dr. Satira Sark lined up for her eye visit.  No difficulty there.  She is taking an ensure strawberry or milk chocolate.  She walked 4.74 miles and yard work before they picked up leaves.  Usually does 6-8 miles daily.  She is down to 93.6 lbs.  No problems eating.  Eats stouffer's meals--likes spaghetti, chicken and  mashed potatoes, every other week she eats two servings of fish, [place] breast.    Doesn't like vegetables.    No indigestion or difficulty with food going down.    She thinks she has lived long b/c she has not taken prescriptions.  Does not want to take any more meds for her calcium with D and additional D.    No pain whatsoever.    Past Medical History:  Diagnosis Date  . Bradycardia    a. s/p MDT dual chamber PPM   . Esophageal stricture   . GERD (gastroesophageal reflux disease)   . Skin cancer   . Syncope   . Tachycardia    asyptomatic nonsustained   Past Surgical History:  Procedure Laterality Date  . CATARACT EXTRACTION  12/10/2011   right   . CATARACT EXTRACTION  12/17/2011   left  . PACEMAKER PLACEMENT  2008   MDT dual chamber PPM implanted by Dr Caryl Comes for symptomatic bradycardia  . PPM GENERATOR CHANGEOUT N/A 03/04/2017   Procedure: PPM GENERATOR CHANGEOUT;  Surgeon: Deboraha Sprang, MD;  Location: [facility];  Service: Cardiovascular;  Laterality: N/A;  . SKIN CANCER EXCISION    . TONSILLECTOMY AND ADENOIDECTOMY      reports that she has never smoked. She has never used smokeless tobacco. She reports that she does not drink alcohol or use drugs.  Functional Status Survey:    Family History  Problem Relation Age of Onset  . Heart disease Mother   . Colon cancer Neg Hx     Health Maintenance  Topic Date Due  . INFLUENZA VACCINE  12/10/2017  . TETANUS/TDAP  07/10/2021  . DEXA SCAN  Completed  . PNA vac Low Risk Adult  Completed    Allergies  Allergen Reactions  . Sulfonamide Derivatives Other (See Comments)    Made very sick, per pt   . Penicillins Swelling and Rash    Has patient had a PCN reaction causing immediate rash, facial/tongue/throat swelling, SOB or lightheadedness with hypotension: Yes Has patient had a PCN reaction causing severe rash involving mucus membranes or skin necrosis: Yes Has patient had a PCN reaction that required  hospitalization: No Has patient had a PCN reaction occurring within the last 10 years: No If all of the above answers are "NO", then may proceed with Cephalosporin use.     Outpatient Encounter Medications as of 09/21/2017  Medication Sig  . Calcium Carbonate-Vitamin D (OSCAL 500/200 D-3 PO) Take 1 tablet by mouth daily.   . Cholecalciferol (VITAMIN D3) 2000 units TABS Take 2,000 Units by mouth daily.  . Misc Natural Product [facility] EYE DROPS #2 OP) Place 1 drop into both eyes daily as needed (for blurry eyes/irritation.). Complete Eye Relief  . Multiple Vitamin (MULTIVITAMIN WITH MINERALS) TABS tablet Take 1 tablet by mouth daily with breakfast.  . Nutritional Supplements (ENSURE ACTIVE PO) Take 1 Bottle by mouth.   No facility-administered encounter medications on file as of 09/21/2017.     Review of Systems  Constitutional: Positive for weight loss. Negative for chills, diaphoresis, fever and malaise/fatigue.  HENT: Positive for hearing loss. Negative for congestion.        Ear wax  Eyes: Negative for blurred vision.  Respiratory: Negative for cough and shortness of breath.   Cardiovascular: Negative for chest pain, palpitations, orthopnea, leg swelling and PND.  Gastrointestinal: Negative for abdominal pain, blood in stool, constipation and melena.  Genitourinary: Negative for dysuria.  Musculoskeletal: Negative for back pain, falls, joint pain and myalgias.  Skin:       Multiple skin biopsies  Neurological: Negative for dizziness and loss of consciousness.  Endo/Heme/Allergies: Does not bruise/bleed easily.  Psychiatric/Behavioral: Negative for depression and memory loss. The patient is not nervous/anxious and does not have insomnia.     Vitals:   09/21/17 1334  BP: 138/74  Pulse: 82  Temp: 97.8 F (36.6 C)  TempSrc: Oral  SpO2: 95%  Weight: 96 lb (43.5 kg)  Height: 5\' 4"  (1.626 m)   Body mass index is 16.48 kg/m. Physical Exam  Constitutional: She is oriented to  person, place, and time. No distress.  Frail female  HENT:  Head: Normocephalic and atraumatic.  Right Ear: External ear normal.  Left Ear: External ear normal.  Nose: Nose normal.  Mouth/Throat: Oropharynx is clear and moist.  Eyes: Pupils are equal, round, and reactive to light. Conjunctivae and EOM are normal.  Neck: Neck supple.  Cardiovascular: Normal rate, regular rhythm, normal heart sounds and intact distal pulses.  Pulmonary/Chest: Effort normal and breath sounds normal. No respiratory distress.  Abdominal: Soft. Bowel sounds are normal. She exhibits no distension and no mass. There is no tenderness. There is no guarding.  Musculoskeletal: Normal range of motion. She exhibits no edema, tenderness or deformity.  Neurological: She is alert and oriented to person, place, and time.  Skin: Skin is  warm and dry. Capillary refill takes less than 2 seconds. There is pallor.  Multiple bandages from skin biopsies and excisions of basal cell ca  Psychiatric: She has a normal mood and affect. Her behavior is normal. Judgment and thought content normal.    Labs reviewed: Basic Metabolic Panel: Recent Labs    02/24/17 1002 09/09/17 0806  NA 139 139  K 4.3 3.9  CL 98 102  CO2 25 28  GLUCOSE 101* 89  BUN 20 16  CREATININE 0.68 0.86  CALCIUM 9.7 9.3   Liver Function Tests: Recent Labs    09/09/17 0806  AST 16  ALT 8  BILITOT 0.7  PROT 6.8   No results for input(s): LIPASE, AMYLASE in the last 8760 hours. No results for input(s): AMMONIA in the last 8760 hours. CBC: Recent Labs    02/24/17 1002 09/09/17 0806  WBC 8.8 6.1  NEUTROABS  --  4,331  HGB 15.4 14.8  HCT 46.1 43.6  MCV 91 90.8  PLT 219 206   Cardiac Enzymes: No results for input(s): CKTOTAL, CKMB, CKMBINDEX, TROPONINI in the last 8760 hours. BNP: Invalid input(s): POCBNP No results found for: HGBA1C Lab Results  Component Value Date   TSH 2.41 09/09/2017   Imaging and Procedures Recently: Cardiology  records reviewed  Assessment/Plan 1. Annual physical exam - performed today and annual labs reviewed with her, mild hyperlipidemia, but pt refuses meds for this--continues aggressive exercise with 6-8 mile walk each am and only eats stouffers and mashed potatoes plus one ensure per day - CBC with Differential/Platelet; Future - COMPLETE METABOLIC PANEL WITH GFR; Future - TSH; Future - Lipid panel; Future  2. Essential hypertension, benign - bp at goal without meds - COMPLETE METABOLIC PANEL WITH GFR; Future  3. Tachy-brady syndrome ([place]) -s/p pacer, apparently there's a recall issue, but she's being monitored carefully in the interim  4. S/P placement of cardiac pacemaker -as above  5. Esophageal stricture -no recent difficulty swallowing by her report, but does keep losing weight with her intense exercise and limited diet  6. Basal cell carcinoma (BCC) of skin of other part of torso -continue regular visits with Dr. Elvera Lennox at her discretion  7. Senile osteoporosis -refuses meds outside of ca with D and additional D, does walk a ton  8. Underweight -cont ensure daily for more protein and increase po take - TSH; Future  9. Body mass index (BMI) of 19 or less in adult -increase intake, cont ensure daily - Lipid panel; Future  10. Bilateral impacted cerumen -flushed with warm water and peroxide  Labs/tests ordered:   Orders Placed This Encounter  Procedures  . CBC with Differential/Platelet    Standing Status:   Future    Standing Expiration Date:   09/22/2019  . COMPLETE METABOLIC PANEL WITH GFR    Standing Status:   Future    Standing Expiration Date:   09/22/2019  . TSH    Standing Status:   Future    Standing Expiration Date:   09/22/2019  . Lipid panel    Standing Status:   Future    Standing Expiration Date:   09/22/2019   F/u 1 year for CPE with labs before  Eleonore Shippee L. Hanifah Royse, D.O. [facility] [address], [place] [address] Cell Phone (Mon-Fri 8am-5pm):  [phone] On Call:  [phone] & follow prompts after 5pm & weekends Office Phone:  [phone] Office Fax:  [phone]

## 2017-09-21 NOTE — Progress Notes (Addendum)
Subjective:   Alexandria Wright is a 84 y.o. female who presents for Medicare Annual (Subsequent) preventive examination.  Last AWV-09/01/16    Objective:     Vitals: BP 138/74 (BP Location: Left Arm, Patient Position: Sitting)   Pulse 82   Temp 97.8 F (36.6 C) (Oral)   Ht 5\' 4"  (1.626 m)   Wt 93 lb 9.6 oz (42.5 kg)   SpO2 95%   BMI 16.07 kg/m   Body mass index is 16.07 kg/m.  Advanced Directives 09/21/2017 03/04/2017 09/05/2016 09/01/2016 08/31/2015 08/25/2014 08/22/2013  Does Patient Have a Medical Advance Directive? Yes Yes Yes Yes Yes Yes Patient has advance directive, copy not in chart  Type of Advance Directive Out of facility DNR (pink MOST or yellow form) [place];Living will [place];Living will;Out of facility DNR (pink MOST or yellow form) [place];Living will Living will Living will [place];Living will  Does patient want to make changes to medical advance directive? No - Patient declined No - Patient declined - No - Patient declined - No - Patient declined No change requested  Copy of [place] in Chart? - No - copy requested Yes Yes Yes Yes Copy requested from other (Comment)  Pre-existing out of facility DNR order (yellow form or pink MOST form) Pink MOST form placed in chart (order not valid for inpatient use);Yellow form placed in chart (order not valid for inpatient use) - Yellow form placed in chart (order not valid for inpatient use);Pink MOST form placed in chart (order not valid for inpatient use) - - - Yellow form placed in chart (order not valid for inpatient use);Pink MOST form placed in chart (order not valid for inpatient use)    Tobacco Social History   Tobacco Use  Smoking Status Never Smoker  Smokeless Tobacco Never Used     Counseling given: Not Answered   Clinical Intake:  Pre-visit preparation completed: No  Pain : No/denies pain      Nutritional Risks: None Diabetes: No  How often do you need to have someone help you when you read instructions, pamphlets, or other written materials from your doctor or pharmacy?: 1 - Never What is the last grade level you completed in school?: College  Interpreter Needed?: No  Information entered by :: [name], RN  Past Medical History:  Diagnosis Date  . Bradycardia    a. s/p MDT dual chamber PPM   . Esophageal stricture   . GERD (gastroesophageal reflux disease)   . Skin cancer   . Syncope   . Tachycardia    asyptomatic nonsustained   Past Surgical History:  Procedure Laterality Date  . CATARACT EXTRACTION  12/10/2011   right   . CATARACT EXTRACTION  12/17/2011   left  . PACEMAKER PLACEMENT  2008   MDT dual chamber PPM implanted by Dr Caryl Comes for symptomatic bradycardia  . PPM GENERATOR CHANGEOUT N/A 03/04/2017   Procedure: PPM GENERATOR CHANGEOUT;  Surgeon: Deboraha Sprang, MD;  Location: [facility];  Service: Cardiovascular;  Laterality: N/A;  . SKIN CANCER EXCISION    . TONSILLECTOMY AND ADENOIDECTOMY     Family History  Problem Relation Age of Onset  . Heart disease Mother   . Colon cancer Neg Hx    Social History   Socioeconomic History  . Marital status: Widowed    Spouse name: Not on file  . Number of children: 0  . Years of  education: Not on file  . Highest education level: Not on file  Occupational History  . Occupation: retired Tour manager  . Financial resource strain: Not hard at all  . Food insecurity:    Worry: Never true    Inability: Never true  . Transportation needs:    Medical: No    Non-medical: No  Tobacco Use  . Smoking status: Never Smoker  . Smokeless tobacco: Never Used  Substance and Sexual Activity  . Alcohol use: No  . Drug use: No  . Sexual activity: Not on file  Lifestyle  . Physical activity:    Days per week: 7 days    Minutes per session: 150+ min  . Stress: Not at all  Relationships  .  Social connections:    Talks on phone: More than three times a week    Gets together: More than three times a week    Attends religious service: More than 4 times per year    Active member of club or organization: No    Attends meetings of clubs or organizations: Never    Relationship status: Widowed  Other Topics Concern  . Not on file  Social History Narrative   Widow -husband died 08/23/2003   Lives alone   Never smoked   Alcohol none   Walks 8 miles daily 5 days a week    Living Will    Outpatient Encounter Medications as of 09/21/2017  Medication Sig  . Calcium Carbonate-Vitamin D (OSCAL 500/200 D-3 PO) Take 1 tablet by mouth daily.   . Cholecalciferol (VITAMIN D3) 2000 units TABS Take 2,000 Units by mouth daily.  . Misc Natural Product [facility] EYE DROPS #2 OP) Place 1 drop into both eyes daily as needed (for blurry eyes/irritation.). Complete Eye Relief  . Multiple Vitamin (MULTIVITAMIN WITH MINERALS) TABS tablet Take 1 tablet by mouth daily with breakfast.  . Nutritional Supplements (ENSURE ACTIVE PO) Take 1 Bottle by mouth.   No facility-administered encounter medications on file as of 09/21/2017.     Activities of Daily Living In your present state of health, do you have any difficulty performing the following activities: 09/21/2017 03/04/2017  Hearing? N N  Vision? N N  Difficulty concentrating or making decisions? N N  Walking or climbing stairs? N N  Dressing or bathing? N Y  Doing errands, shopping? N -  Preparing Food and eating ? N -  Using the Toilet? N -  In the past six months, have you accidently leaked urine? Y -  Comment stress incontinece -  Do you have problems with loss of bowel control? N -  Managing your Medications? N -  Managing your Finances? N -  Housekeeping or managing your Housekeeping? N -  Some recent data might be hidden    Patient Care Team: Gayland Curry, DO as PCP - General (Geriatric Medicine) Marygrace Drought, MD as Consulting  Physician (Ophthalmology)    Assessment:   This is a routine wellness examination for Alexandria Wright.  Exercise Activities and Dietary recommendations Current Exercise Habits: Home exercise routine, Type of exercise: walking, Time (Minutes): > 60, Frequency (Times/Week): 7, Weekly Exercise (Minutes/Week): 0, Exercise limited by: None identified  Goals    None      Fall Risk Fall Risk  09/21/2017 09/01/2016 08/31/2015 08/25/2014 08/22/2013  Falls in the past year? No Yes No No No   Is the patient's home free of loose throw rugs in walkways, pet beds, electrical cords, etc?  yes      Grab bars in the bathroom? yes      Handrails on the stairs?   yes      Adequate lighting?   yes  Depression Screen PHQ 2/9 Scores 09/21/2017 09/01/2016 08/31/2015 08/25/2014  PHQ - 2 Score 0 0 0 0     Cognitive Function MMSE - Mini Mental State Exam 09/21/2017 09/01/2016 08/31/2015 08/25/2014 08/22/2013  Orientation to time 5 5 5 5 5   Orientation to Place 5 5 5 5 5   Registration 3 3 3 3 3   Attention/ Calculation 5 5 5 5 5   Recall 2 3 3 2 2   Language- name 2 objects 2 2 2 2 2   Language- repeat 1 1 1 1 1   Language- follow 3 step command 3 3 3 3 3   Language- read & follow direction 1 1 1 1 1   Write a sentence 1 1 1 1 1   Copy design 1 1 1 1 1   Total score 29 30 30 29 29         Immunization History  Administered Date(s) Administered  . Influenza-Unspecified 01/25/2013, 02/05/2016  . Pneumococcal Conjugate-13 08/25/2014, 02/05/2016  . Pneumococcal Polysaccharide-23 08/26/2011  . Tdap 07/11/2011  . Zoster 08/23/2013, 09/04/2016  . Zoster Recombinat (Shingrix) 12/04/2016    Qualifies for Shingles Vaccine? No, up to date  Screening Tests Health Maintenance  Topic Date Due  . INFLUENZA VACCINE  12/10/2017  . TETANUS/TDAP  07/10/2021  . DEXA SCAN  Completed  . PNA vac Low Risk Adult  Completed    Cancer Screenings: Lung: Low Dose CT Chest recommended if Age 48-80 years, 30 pack-year currently smoking OR  have quit w/in 15years. Patient does not qualify. Breast:  Up to date on Mammogram? Yes   Up to date of Bone Density/Dexa? Yes Colorectal: up to date  Additional Screenings:  Hepatitis C Screening: declined     Plan:    I have personally reviewed and addressed the Medicare Annual Wellness questionnaire and have noted the following in the patient's chart:  A. Medical and social history B. Use of alcohol, tobacco or illicit drugs  C. Current medications and supplements D. Functional ability and status E.  Nutritional status F.  Physical activity G. Advance directives H. List of other physicians I.  Hospitalizations, surgeries, and ER visits in previous 12 months J.  [place] to include hearing, vision, cognitive, depression L. Referrals and appointments - none  In addition, I have reviewed and discussed with patient certain preventive protocols, quality metrics, and best practice recommendations. A written personalized care plan for preventive services as well as general preventive health recommendations were provided to patient.  See attached scanned questionnaire for additional information.   Signed,   [name], RN Nurse Health Advisor  Patient Concerns: None

## 2017-12-07 ENCOUNTER — Ambulatory Visit (INDEPENDENT_AMBULATORY_CARE_PROVIDER_SITE_OTHER): Payer: Medicare Other | Admitting: *Deleted

## 2017-12-07 DIAGNOSIS — I495 Sick sinus syndrome: Secondary | ICD-10-CM

## 2017-12-07 NOTE — Progress Notes (Signed)
Remote pacemaker transmission.   

## 2017-12-08 ENCOUNTER — Encounter: Payer: Self-pay | Admitting: Cardiology

## 2017-12-11 LAB — CUP PACEART REMOTE DEVICE CHECK
Battery Remaining Longevity: 143 mo
Battery Voltage: 2.78 V
Brady Statistic AP VS Percent: 2 %
Brady Statistic AS VP Percent: 8 %
Date Time Interrogation Session: 20190729120303
Implantable Lead Implant Date: 20080703
Implantable Lead Location: 753859
Implantable Lead Location: 753860
Implantable Lead Model: 5076
Implantable Pulse Generator Implant Date: 20181024
Lead Channel Impedance Value: 552 Ohm
Lead Channel Pacing Threshold Amplitude: 0.5 V
Lead Channel Pacing Threshold Amplitude: 0.5 V
Lead Channel Pacing Threshold Pulse Width: 0.4 ms
Lead Channel Pacing Threshold Pulse Width: 0.4 ms
Lead Channel Sensing Intrinsic Amplitude: 11.2 mV
Lead Channel Setting Pacing Amplitude: 1.5 V
Lead Channel Setting Pacing Amplitude: 2.5 V
Lead Channel Setting Pacing Pulse Width: 0.4 ms
Lead Channel Setting Sensing Sensitivity: 5.6 mV
MDC IDC LEAD IMPLANT DT: 20080703
MDC IDC MSMT BATTERY IMPEDANCE: 104 Ohm
MDC IDC MSMT LEADCHNL RA SENSING INTR AMPL: 1.4 mV
MDC IDC MSMT LEADCHNL RV IMPEDANCE VALUE: 609 Ohm
MDC IDC STAT BRADY AP VP PERCENT: 1 %
MDC IDC STAT BRADY AS VS PERCENT: 90 %

## 2018-02-15 NOTE — Progress Notes (Signed)
Cardiology Office Note Date:  02/22/2018  Patient ID:  Alexandria Wright, Alexandria Wright Aug 28, 1929, MRN [id number] PCP:  Gayland Curry, DO  Electrophysiologist:  Dr. Caryl Comes    Chief Complaint: concerned about device "recall"  History of Present Illness: Alexandria Wright is a 84 y.o. female with history of symtomatic bradycardia w/PPM, GERD, esophageal stricture, NSVT, comes to the office today to be seen or Dr. Caryl Comes.  She last saw him in Feb this year, she was doing well.  Noted her PPM on advisory recall, given intact conduction, no change, expected patch ready if the fall.  She was noted on remote in July noted SCAF, 7minutes.  No changes.  She comes today feeling very well.  She walks as much as 4-6 miles most days, has excellent exertional capacity.  She denies any symptoms of CP or SOB, no DOE, no dizziness, near syncope or syncope.  She infrequently will feel her heart beat fast, this is brief and self limited, and infrequent.  She follows regularly with her PMD where she gets her labs done.  Device information: MDT dual chamber PPM, implanted 11/12/06, Dr. Lovena Le.  Gen change 03/04/17, Dr. Caryl Comes   Past Medical History:  Diagnosis Date  . Bradycardia    a. s/p MDT dual chamber PPM   . Esophageal stricture   . GERD (gastroesophageal reflux disease)   . Skin cancer   . Syncope   . Tachycardia    asyptomatic nonsustained    Past Surgical History:  Procedure Laterality Date  . CATARACT EXTRACTION  12/10/2011   right   . CATARACT EXTRACTION  12/17/2011   left  . PACEMAKER PLACEMENT  2008   MDT dual chamber PPM implanted by Dr Caryl Comes for symptomatic bradycardia  . PPM GENERATOR CHANGEOUT N/A 03/04/2017   Procedure: PPM GENERATOR CHANGEOUT;  Surgeon: Deboraha Sprang, MD;  Location: [facility];  Service: Cardiovascular;  Laterality: N/A;  . SKIN CANCER EXCISION    . TONSILLECTOMY AND ADENOIDECTOMY      Current Outpatient Medications  Medication Sig Dispense Refill  .  Calcium Carbonate-Vitamin D (OSCAL 500/200 D-3 PO) Take 1 tablet by mouth daily.     . Cholecalciferol (VITAMIN D3) 2000 units TABS Take 2,000 Units by mouth daily.    . Misc Natural Product [facility] EYE DROPS #2 OP) Place 1 drop into both eyes daily as needed (for blurry eyes/irritation.). Complete Eye Relief    . Multiple Vitamin (MULTIVITAMIN WITH MINERALS) TABS tablet Take 1 tablet by mouth daily with breakfast.    . Nutritional Supplements (ENSURE ACTIVE PO) Take 1 Bottle by mouth.     No current facility-administered medications for this visit.     Allergies:   Sulfonamide derivatives and Penicillins   Social History:  The patient  reports that she has never smoked. She has never used smokeless tobacco. She reports that she does not drink alcohol or use drugs.   Family History:  The patient's family history includes Heart disease in her mother.  ROS:  Please see the history of present illness.  All other systems are reviewed and otherwise negative.   PHYSICAL EXAM:  VS:  BP 122/88   Pulse (!) 102   Ht 5\' 4"  (1.626 m)   Wt 94 lb (42.6 kg)   BMI 16.14 kg/m  BMI: Body mass index is 16.14 kg/m. Well nourished, well developed though very thin body habitus, in no acute distress  HEENT: normocephalic, atraumatic  Neck: no JVD, carotid bruits  or masses Cardiac: RRR; no significant murmurs, no rubs, or gallops Lungs: CTA b/l, no wheezing, rhonchi or rales  Abd: soft, nontender MS: no deformity, age appropriate trophy Ext:  no edema  Skin: warm and dry, no rash Neuro:  No gross deficits appreciated Psych: euthymic mood, full affect  PPM site is stable, no tethering or discomfort   EKG:  Not done today PPM interrogation done today and reviewed by myself: battery and lead measurements are good, AHR/VHR noted.  EGMs available are reviewed.  AT 1:1 noted.  55 minute episode (AHR) episode does not have EGM, max A rate only 159bpm, doubt AFib   10/30/16 TTE Study Conclusions -  Left ventricle: The cavity size was normal. Wall thickness was   normal. Systolic function was normal. The estimated ejection   fraction was in the range of 55% to 60%. Wall motion was normal;   there were no regional wall motion abnormalities. Doppler   parameters are consistent with abnormal left ventricular   relaxation (grade 1 diastolic dysfunction). - Aortic valve: Trileaflet; moderately calcified leaflets. There   was no stenosis. - Mitral valve: Mildly calcified annulus. Mildly calcified   leaflets. The anterior leaflet had a hockey stick appearance   suggestive of rheumatic involvement. There was no evidence for   stenosis. There was trivial regurgitation. - Left atrium: The atrium was mildly dilated. - Right ventricle: The cavity size was normal. Pacer wire or   catheter noted in right ventricle. Systolic function was normal. - Tricuspid valve: Peak RV-RA gradient (S): 29 mm Hg. - Pulmonary arteries: PA peak pressure: 32 mm Hg (S). - Inferior vena cava: The vessel was normal in size. The   respirophasic diameter changes were in the normal range (>= 50%),   consistent with normal central venous pressure. - Pericardium, extracardiac: A trivial pericardial effusion was   identified. Impressions: - Normal LV size with EF 55-60%. Calcified, hockey stick-shaped   mitral valve appears rheumatic but no stenosis and trivial   regurgitation. Normal RV size and systolic function.  Recent Labs: 09/09/2017: ALT 8; BUN 16; Creat 0.86; Hemoglobin 14.8; Platelets 206; Potassium 3.9; Sodium 139; TSH 2.41  09/09/2017: Cholesterol 214; HDL 70; LDL Cholesterol (Calc) 124; Total CHOL/HDL Ratio 3.1; Triglycerides 98   CrCl cannot be calculated (Patient's most recent lab result is older than the maximum 21 days allowed.).   Wt Readings from Last 3 Encounters:  02/22/18 94 lb (42.6 kg)  09/21/17 96 lb (43.5 kg)  09/21/17 93 lb 9.6 oz (42.5 kg)     Other studies reviewed: Additional  studies/records reviewed today include: summarized above  ASSESSMENT AND PLAN:  1. PPM     Intact function, software upgrade is completed.     She is not dependent, VP 9%  2. SCAF noted by Dr. Caryl Comes last visit     Unable to program for better EGM storage.  Low suspicion of true AFib by A rates      Follow    Disposition: continue her remotes, we will see her back in 6 moths otherwise, sooner if needed.  Current medicines are reviewed at length with the patient today.  The patient did not have any concerns regarding medicines.  Venetia Night, PA-C 02/22/2018 8:28 AM     [address] [place] [phone] (office)  [phone] (fax)

## 2018-02-22 ENCOUNTER — Ambulatory Visit: Payer: Medicare Other | Admitting: Physician Assistant

## 2018-02-22 VITALS — BP 122/88 | HR 102 | Ht 64.0 in | Wt 94.0 lb

## 2018-02-22 DIAGNOSIS — I495 Sick sinus syndrome: Secondary | ICD-10-CM

## 2018-02-22 DIAGNOSIS — Z950 Presence of cardiac pacemaker: Secondary | ICD-10-CM

## 2018-02-22 NOTE — Patient Instructions (Addendum)
Medication Instructions:   Your physician recommends that you continue on your current medications as directed. Please refer to the Current Medication list given to you today.   If you need a refill on your cardiac medications before your next appointment, please call your pharmacy.  Labwork:  NONE ORDERED  TODAY    Testing/Procedures:  Your physician wants you to follow-up in:  [place] will receive a reminder letter in the mail two months in advance. If you don't receive a letter, please call our office to schedule the follow-up appointment.      Follow-Up: Remote monitoring is used to monitor your Pacemaker of ICD from home. This monitoring reduces the number of office visits required to check your device to one time per year. It allows [place] to keep an eye on the functioning of your device to ensure it is working properly. You are scheduled for a device check from home on ...03-08-18 You may send your transmission at any time that day. If you have a wireless device, the transmission will be sent automatically. After your physician reviews your transmission, you will receive a postcard with your next transmission date.    Any Other Special Instructions Will Be Listed Below (If Applicable).

## 2018-03-08 ENCOUNTER — Ambulatory Visit: Payer: Medicare Other | Admitting: *Deleted

## 2018-03-08 DIAGNOSIS — I495 Sick sinus syndrome: Secondary | ICD-10-CM

## 2018-03-08 DIAGNOSIS — R55 Syncope and collapse: Secondary | ICD-10-CM

## 2018-03-08 NOTE — Progress Notes (Signed)
Remote pacemaker transmission.   

## 2018-03-19 ENCOUNTER — Encounter: Payer: Self-pay | Admitting: Internal Medicine

## 2018-05-03 LAB — CUP PACEART REMOTE DEVICE CHECK
Battery Impedance: 104 Ohm
Battery Voltage: 2.78 V
Brady Statistic AP VP Percent: 0 %
Brady Statistic AP VS Percent: 1 %
Brady Statistic AS VP Percent: 0 %
Date Time Interrogation Session: 20191028153636
Implantable Lead Location: 753859
Implantable Lead Location: 753860
Implantable Lead Model: 5076
Implantable Lead Model: 5076
Lead Channel Impedance Value: 535 Ohm
Lead Channel Pacing Threshold Amplitude: 0.5 V
Lead Channel Pacing Threshold Pulse Width: 0.4 ms
Lead Channel Pacing Threshold Pulse Width: 0.4 ms
Lead Channel Setting Pacing Amplitude: 2.5 V
Lead Channel Setting Pacing Pulse Width: 0.4 ms
MDC IDC LEAD IMPLANT DT: 20080703
MDC IDC LEAD IMPLANT DT: 20080703
MDC IDC MSMT BATTERY REMAINING LONGEVITY: 145 mo
MDC IDC MSMT LEADCHNL RV IMPEDANCE VALUE: 636 Ohm
MDC IDC MSMT LEADCHNL RV PACING THRESHOLD AMPLITUDE: 0.625 V
MDC IDC PG IMPLANT DT: 20181024
MDC IDC SET LEADCHNL RA PACING AMPLITUDE: 1.5 V
MDC IDC SET LEADCHNL RV SENSING SENSITIVITY: 5.6 mV
MDC IDC STAT BRADY AS VS PERCENT: 99 %

## 2018-06-07 ENCOUNTER — Ambulatory Visit (INDEPENDENT_AMBULATORY_CARE_PROVIDER_SITE_OTHER): Payer: Medicare Other

## 2018-06-07 DIAGNOSIS — I495 Sick sinus syndrome: Secondary | ICD-10-CM | POA: Diagnosis not present

## 2018-06-08 NOTE — Progress Notes (Signed)
Remote pacemaker transmission.   

## 2018-06-10 LAB — CUP PACEART REMOTE DEVICE CHECK
Battery Impedance: 105 Ohm
Battery Voltage: 2.78 V
Brady Statistic AP VP Percent: 0 %
Brady Statistic AP VS Percent: 2 %
Brady Statistic AS VP Percent: 1 %
Brady Statistic AS VS Percent: 96 %
Date Time Interrogation Session: 20200127140422
Implantable Lead Implant Date: 20080703
Implantable Lead Implant Date: 20080703
Implantable Lead Location: 753859
Implantable Lead Location: 753860
Implantable Lead Model: 5076
Implantable Lead Model: 5076
Lead Channel Impedance Value: 569 Ohm
Lead Channel Impedance Value: 640 Ohm
Lead Channel Pacing Threshold Amplitude: 0.5 V
Lead Channel Pacing Threshold Amplitude: 0.75 V
Lead Channel Pacing Threshold Pulse Width: 0.4 ms
Lead Channel Pacing Threshold Pulse Width: 0.4 ms
Lead Channel Setting Pacing Amplitude: 1.5 V
Lead Channel Setting Pacing Amplitude: 2.5 V
Lead Channel Setting Pacing Pulse Width: 0.4 ms
Lead Channel Setting Sensing Sensitivity: 5.6 mV
MDC IDC MSMT BATTERY REMAINING LONGEVITY: 145 mo
MDC IDC PG IMPLANT DT: 20181024

## 2018-07-01 ENCOUNTER — Encounter: Payer: Self-pay | Admitting: Family

## 2018-08-24 ENCOUNTER — Encounter: Payer: Medicare Other | Admitting: Internal Medicine

## 2018-09-06 ENCOUNTER — Other Ambulatory Visit: Payer: Self-pay

## 2018-09-06 ENCOUNTER — Ambulatory Visit (INDEPENDENT_AMBULATORY_CARE_PROVIDER_SITE_OTHER): Payer: Medicare Other | Admitting: *Deleted

## 2018-09-06 DIAGNOSIS — R55 Syncope and collapse: Secondary | ICD-10-CM | POA: Diagnosis not present

## 2018-09-06 DIAGNOSIS — I495 Sick sinus syndrome: Secondary | ICD-10-CM

## 2018-09-06 LAB — CUP PACEART REMOTE DEVICE CHECK
Battery Impedance: 178 Ohm
Battery Remaining Longevity: 128 mo
Battery Voltage: 2.78 V
Brady Statistic AP VP Percent: 0 %
Brady Statistic AP VS Percent: 3 %
Brady Statistic AS VP Percent: 1 %
Brady Statistic AS VS Percent: 96 %
Date Time Interrogation Session: 20200427171354
Implantable Lead Implant Date: 20080703
Implantable Lead Implant Date: 20080703
Implantable Lead Location: 753859
Implantable Lead Location: 753860
Implantable Lead Model: 5076
Implantable Lead Model: 5076
Implantable Pulse Generator Implant Date: 20181024
Lead Channel Impedance Value: 543 Ohm
Lead Channel Impedance Value: 605 Ohm
Lead Channel Pacing Threshold Amplitude: 0.625 V
Lead Channel Pacing Threshold Amplitude: 0.625 V
Lead Channel Pacing Threshold Pulse Width: 0.4 ms
Lead Channel Pacing Threshold Pulse Width: 0.4 ms
Lead Channel Setting Pacing Amplitude: 1.5 V
Lead Channel Setting Pacing Amplitude: 2.5 V
Lead Channel Setting Pacing Pulse Width: 0.4 ms
Lead Channel Setting Sensing Sensitivity: 5.6 mV

## 2018-09-15 NOTE — Progress Notes (Signed)
Remote pacemaker transmission.   

## 2018-09-20 ENCOUNTER — Other Ambulatory Visit: Payer: Medicare Other

## 2018-09-20 ENCOUNTER — Other Ambulatory Visit: Payer: Self-pay

## 2018-09-20 DIAGNOSIS — I10 Essential (primary) hypertension: Secondary | ICD-10-CM

## 2018-09-20 DIAGNOSIS — Z681 Body mass index (BMI) 19 or less, adult: Secondary | ICD-10-CM

## 2018-09-20 DIAGNOSIS — Z0000 Encounter for general adult medical examination without abnormal findings: Secondary | ICD-10-CM

## 2018-09-20 DIAGNOSIS — R636 Underweight: Secondary | ICD-10-CM

## 2018-09-20 LAB — CBC WITH DIFFERENTIAL/PLATELET
Absolute Monocytes: 786 cells/uL (ref 200–950)
Basophils Absolute: 49 cells/uL (ref 0–200)
Basophils Relative: 0.6 %
Eosinophils Absolute: 130 cells/uL (ref 15–500)
Eosinophils Relative: 1.6 %
HCT: 43 % (ref 35.0–45.0)
Hemoglobin: 14.9 g/dL (ref 11.7–15.5)
Lymphs Abs: 940 cells/uL (ref 850–3900)
MCH: 32 pg (ref 27.0–33.0)
MCHC: 34.7 g/dL (ref 32.0–36.0)
MCV: 92.5 fL (ref 80.0–100.0)
MPV: 10.4 fL (ref 7.5–12.5)
Monocytes Relative: 9.7 %
Neutro Abs: 6197 cells/uL (ref 1500–7800)
Neutrophils Relative %: 76.5 %
Platelets: 308 10*3/uL (ref 140–400)
RBC: 4.65 10*6/uL (ref 3.80–5.10)
RDW: 12.6 % (ref 11.0–15.0)
Total Lymphocyte: 11.6 %
WBC: 8.1 10*3/uL (ref 3.8–10.8)

## 2018-09-20 LAB — LIPID PANEL
Cholesterol: 253 mg/dL — ABNORMAL HIGH (ref <200)
HDL: 64 mg/dL (ref >=50)
LDL Cholesterol (Calc): 167 mg/dL (calc) — ABNORMAL HIGH
Non-HDL Cholesterol (Calc): 189 mg/dL (calc) — ABNORMAL HIGH (ref <130)
Total CHOL/HDL Ratio: 4 (calc) (ref <5.0)
Triglycerides: 106 mg/dL (ref <150)

## 2018-09-20 LAB — COMPLETE METABOLIC PANEL WITH GFR
AG Ratio: 1.8 (calc) (ref 1.0–2.5)
ALT: 9 U/L (ref 6–29)
AST: 15 U/L (ref 10–35)
Albumin: 4.5 g/dL (ref 3.6–5.1)
Alkaline phosphatase (APISO): 65 U/L (ref 37–153)
BUN/Creatinine Ratio: 17 (calc) (ref 6–22)
BUN: 16 mg/dL (ref 7–25)
CO2: 28 mmol/L (ref 20–32)
Calcium: 9.8 mg/dL (ref 8.6–10.4)
Chloride: 101 mmol/L (ref 98–110)
Creat: 0.92 mg/dL — ABNORMAL HIGH (ref 0.60–0.88)
GFR, Est African American: 65 mL/min/{1.73_m2} (ref >=60)
GFR, Est Non African American: 56 mL/min/{1.73_m2} — ABNORMAL LOW (ref >=60)
Globulin: 2.5 g/dL (calc) (ref 1.9–3.7)
Glucose, Bld: 102 mg/dL — ABNORMAL HIGH (ref 65–99)
Potassium: 4.1 mmol/L (ref 3.5–5.3)
Sodium: 139 mmol/L (ref 135–146)
Total Bilirubin: 0.7 mg/dL (ref 0.2–1.2)
Total Protein: 7 g/dL (ref 6.1–8.1)

## 2018-09-20 LAB — TSH: TSH: 1.73 mIU/L (ref 0.40–4.50)

## 2018-09-23 ENCOUNTER — Ambulatory Visit: Payer: Self-pay

## 2018-09-23 ENCOUNTER — Encounter: Payer: Self-pay | Admitting: Family

## 2018-09-23 ENCOUNTER — Encounter: Payer: Self-pay | Admitting: *Deleted

## 2018-09-23 ENCOUNTER — Ambulatory Visit: Payer: Medicare Other | Admitting: Internal Medicine

## 2018-09-24 ENCOUNTER — Encounter: Payer: Self-pay | Admitting: Family

## 2018-09-27 ENCOUNTER — Ambulatory Visit: Payer: Self-pay

## 2018-11-01 ENCOUNTER — Encounter: Payer: Self-pay | Admitting: Internal Medicine

## 2018-11-01 ENCOUNTER — Other Ambulatory Visit: Payer: Self-pay

## 2018-11-01 ENCOUNTER — Ambulatory Visit: Payer: Medicare Other | Admitting: Internal Medicine

## 2018-11-01 VITALS — BP 132/78 | HR 96 | Temp 97.6°F | Ht 64.0 in | Wt 94.0 lb

## 2018-11-01 DIAGNOSIS — H6123 Impacted cerumen, bilateral: Secondary | ICD-10-CM

## 2018-11-01 DIAGNOSIS — Z0000 Encounter for general adult medical examination without abnormal findings: Secondary | ICD-10-CM

## 2018-11-01 DIAGNOSIS — R636 Underweight: Secondary | ICD-10-CM

## 2018-11-01 DIAGNOSIS — I495 Sick sinus syndrome: Secondary | ICD-10-CM | POA: Diagnosis not present

## 2018-11-01 DIAGNOSIS — E7800 Pure hypercholesterolemia, unspecified: Secondary | ICD-10-CM

## 2018-11-01 DIAGNOSIS — Z950 Presence of cardiac pacemaker: Secondary | ICD-10-CM

## 2018-11-01 DIAGNOSIS — R739 Hyperglycemia, unspecified: Secondary | ICD-10-CM

## 2018-11-01 DIAGNOSIS — I10 Essential (primary) hypertension: Secondary | ICD-10-CM

## 2018-11-01 DIAGNOSIS — Z681 Body mass index (BMI) 19 or less, adult: Secondary | ICD-10-CM

## 2018-11-01 NOTE — Patient Instructions (Signed)
Keep up the good work

## 2018-11-01 NOTE — Progress Notes (Signed)
Provider:  Rexene Edison. Mariea Clonts, D.O., C.M.D. Location:   [facility]   Place of Service:   clinic  Previous PCP: Gayland Curry, DO Patient Care Team: Gayland Curry, DO as PCP - General (Geriatric Medicine) Marygrace Drought, MD as Consulting Physician (Ophthalmology)  Extended Emergency Contact Information Primary Emergency Contact: Servando Snare of [place] Phone: [phone] Relation: Sister  Code Status: DNR on file Goals of Care: Advanced Directive information Advanced Directives 09/21/2017  Does Patient Have a Medical Advance Directive? Yes  Type of Advance Directive Out of facility DNR (pink MOST or yellow form)  Does patient want to make changes to medical advance directive? No - Patient declined  Copy of [place] in Chart? -  Pre-existing out of facility DNR order (yellow form or pink MOST form) Pink MOST form placed in chart (order not valid for inpatient use);Yellow form placed in chart (order not valid for inpatient use)      Chief Complaint  Patient presents with  . Annual Exam    CPE    HPI: Patient is a 85 y.o. female seen today for an annual physical exam.  Currently, she is getting help from family with grocery and other deliveries amid covid.  She's happy with being in.  Is not a big social type.  Her brother had his 90th bday the other day. She is walking between 4-6 miles daily when the weather is warmer.  Her weight is stable at 94 lbs and she drinks ensure daily.    No dysphagia concerns.    Has never slept well since her husband passed away, but functions fine.  No pain except her chronic back pain.  Roll-on aspercreme a couple times a day solves the problem.  BP is good.    Had remote pacer transmission in April without problems. No chest pain, sob.  Seeing well.    Able to work out in the yard.    She notes that last year after she came and had her ears flushed of cerumen, then she  was able to hear her turn signal which she couldn't for years before.  Still can now, but cerumen is impacted at present.  No changes in social or family history since last CPE.  No changes in skin lesions--sees derm q 3 mos.    She also has seen her dentist last week without problems.  Past Medical History:  Diagnosis Date  . Bradycardia    a. s/p MDT dual chamber PPM   . Esophageal stricture   . GERD (gastroesophageal reflux disease)   . Skin cancer   . Syncope   . Tachycardia    asyptomatic nonsustained   Past Surgical History:  Procedure Laterality Date  . CATARACT EXTRACTION  12/10/2011   right   . CATARACT EXTRACTION  12/17/2011   left  . PACEMAKER PLACEMENT  2008   MDT dual chamber PPM implanted by Dr Caryl Comes for symptomatic bradycardia  . PPM GENERATOR CHANGEOUT N/A 03/04/2017   Procedure: PPM GENERATOR CHANGEOUT;  Surgeon: Deboraha Sprang, MD;  Location: [facility];  Service: Cardiovascular;  Laterality: N/A;  . SKIN CANCER EXCISION    . TONSILLECTOMY AND ADENOIDECTOMY      reports that she has never smoked. She has never used smokeless tobacco. She reports that she does not drink alcohol or use drugs.  Functional Status Survey:  independent  Family History  Problem  Relation Age of Onset  . Heart disease Mother   . Colon cancer Neg Hx     Health Maintenance  Topic Date Due  . INFLUENZA VACCINE  12/11/2018  . TETANUS/TDAP  07/10/2021  . DEXA SCAN  Completed  . PNA vac Low Risk Adult  Completed    Allergies  Allergen Reactions  . Sulfonamide Derivatives Other (See Comments)    Made very sick, per pt   . Penicillins Swelling and Rash    Has patient had a PCN reaction causing immediate rash, facial/tongue/throat swelling, SOB or lightheadedness with hypotension: Yes Has patient had a PCN reaction causing severe rash involving mucus membranes or skin necrosis: Yes Has patient had a PCN reaction that required hospitalization: No Has patient had a PCN  reaction occurring within the last 10 years: No If all of the above answers are "NO", then may proceed with Cephalosporin use.     Outpatient Encounter Medications as of 11/01/2018  Medication Sig  . Calcium Carbonate-Vitamin D (OSCAL 500/200 D-3 PO) Take 1 tablet by mouth daily.   . Cholecalciferol (VITAMIN D3) 2000 units TABS Take 2,000 Units by mouth daily.  . Misc Natural Product [facility] EYE DROPS #2 OP) Place 1 drop into both eyes daily as needed (for blurry eyes/irritation.). Complete Eye Relief  . Multiple Vitamin (MULTIVITAMIN WITH MINERALS) TABS tablet Take 1 tablet by mouth daily with breakfast.  . Nutritional Supplements (ENSURE ACTIVE PO) Take 1 Bottle by mouth.   No facility-administered encounter medications on file as of 11/01/2018.     Review of Systems  Constitutional: Negative for chills, fever and weight loss.  HENT: Positive for hearing loss.   Eyes: Negative for blurred vision.  Respiratory: Negative for cough and shortness of breath.   Cardiovascular: Negative for chest pain, palpitations and leg swelling.  Gastrointestinal: Negative for abdominal pain, blood in stool, constipation, diarrhea and melena.  Genitourinary: Negative for dysuria, frequency and urgency.  Musculoskeletal: Positive for back pain. Negative for falls and joint pain.  Skin: Negative for itching and rash.  Neurological: Negative for dizziness and loss of consciousness.  Endo/Heme/Allergies: Bruises/bleeds easily.  Psychiatric/Behavioral: Negative for depression and memory loss. The patient is not nervous/anxious and does not have insomnia.     Vitals:   11/01/18 0915  BP: 132/78  Pulse: 96  Temp: 97.6 F (36.4 C)  TempSrc: Oral  SpO2: 97%  Weight: 94 lb (42.6 kg)  Height: 5\' 4"  (1.626 m)   Body mass index is 16.14 kg/m. Physical Exam Vitals signs reviewed.  Constitutional:      General: She is not in acute distress.    Appearance: Normal appearance. She is not ill-appearing  or toxic-appearing.     Comments: Thin female  HENT:     Head: Normocephalic and atraumatic.     Right Ear: External ear normal. There is impacted cerumen.     Left Ear: There is impacted cerumen.     Nose:     Comments: Nose and mouth deferred due to cerumen impaction Eyes:     Extraocular Movements: Extraocular movements intact.     Conjunctiva/sclera: Conjunctivae normal.     Pupils: Pupils are equal, round, and reactive to light.  Cardiovascular:     Rate and Rhythm: Normal rate and regular rhythm.     Pulses: Normal pulses.     Heart sounds: Normal heart sounds.  Pulmonary:     Effort: Pulmonary effort is normal.     Breath sounds: Normal breath sounds.  Abdominal:     General: Abdomen is flat. Bowel sounds are normal. There is no distension.     Palpations: There is no mass.     Tenderness: There is no abdominal tenderness. There is no guarding or rebound.     Hernia: No hernia is present.  Musculoskeletal: Normal range of motion.     Right lower leg: No edema.     Left lower leg: No edema.  Skin:    General: Skin is warm and dry.     Capillary Refill: Capillary refill takes less than 2 seconds.     Coloration: Skin is pale.  Neurological:     General: No focal deficit present.     Mental Status: She is alert and oriented to person, place, and time.     Cranial Nerves: No cranial nerve deficit.     Sensory: No sensory deficit.     Motor: No weakness.     Coordination: Coordination normal.     Gait: Gait normal.     Deep Tendon Reflexes: Reflexes normal.  Psychiatric:        Mood and Affect: Mood normal.        Behavior: Behavior normal.        Thought Content: Thought content normal.        Judgment: Judgment normal.     Labs reviewed: Basic Metabolic Panel: Recent Labs    09/20/18 0807  NA 139  K 4.1  CL 101  CO2 28  GLUCOSE 102*  BUN 16  CREATININE 0.92*  CALCIUM 9.8   Liver Function Tests: Recent Labs    09/20/18 0807  AST 15  ALT 9    BILITOT 0.7  PROT 7.0   No results for input(s): LIPASE, AMYLASE in the last 8760 hours. No results for input(s): AMMONIA in the last 8760 hours. CBC: Recent Labs    09/20/18 0807  WBC 8.1  NEUTROABS 6,197  HGB 14.9  HCT 43.0  MCV 92.5  PLT 308   Cardiac Enzymes: No results for input(s): CKTOTAL, CKMB, CKMBINDEX, TROPONINI in the last 8760 hours. BNP: Invalid input(s): POCBNP No results found for: HGBA1C Lab Results  Component Value Date   TSH 1.73 09/20/2018    Assessment/Plan 1. Annual physical exam CPE performed today  2. Essential hypertension, benign -bp well controlled, cont walking, not on meds anymore  3. Tachy-brady syndrome ([place]) -has pacemaker in place, no problems  4. S/P placement of cardiac pacemaker -checked in April, doing well  5. Underweight -weight stable but low -walks a lot and intake is limited to frozen meals and ensure mostly  6. Body mass index (BMI) of 19 or less in adult -cont ensure daily  7. Bilateral hearing loss due to cerumen impaction -ears flushed with warm water and peroxide today  Labs/tests ordered:   Lab Orders     CBC with Differential/Platelet     COMPLETE METABOLIC PANEL WITH GFR     Lipid panel     Hemoglobin A1c     TSH  F/u in 1 year for CPE  Brailyn Delman L. Ardean Melroy, D.O. [facility] [address], [place] [address] Cell Phone (Mon-Fri 8am-5pm):  [phone] On Call:  [phone] & follow prompts after 5pm & weekends Office Phone:  [phone] Office Fax:  [phone]

## 2018-11-02 ENCOUNTER — Encounter: Payer: Self-pay | Admitting: Family

## 2018-11-02 ENCOUNTER — Ambulatory Visit (INDEPENDENT_AMBULATORY_CARE_PROVIDER_SITE_OTHER): Payer: Medicare Other | Admitting: Family

## 2018-11-02 VITALS — BP 122/88 | HR 68 | Temp 98.6°F | Ht 64.0 in | Wt 94.0 lb

## 2018-11-02 DIAGNOSIS — Z0000 Encounter for general adult medical examination without abnormal findings: Secondary | ICD-10-CM | POA: Diagnosis not present

## 2018-11-02 NOTE — Progress Notes (Signed)
Subjective:   Alexandria Wright is a 85 y.o. female who presents for Medicare Annual (Subsequent) preventive examination.  Review of Systems:  Cardiac Risk Factors include: advanced age (>20men, >3 women)     Objective:     Vitals: BP 122/88   Pulse 68   Temp 98.6 F (37 C) (Oral)   Ht 5\' 4"  (1.626 m)   Wt 94 lb (42.6 kg)   SpO2 95%   BMI 16.14 kg/m   Body mass index is 16.14 kg/m.  Advanced Directives 11/02/2018 09/21/2017 03/04/2017 09/05/2016 09/01/2016 08/31/2015 08/25/2014  Does Patient Have a Medical Advance Directive? No Yes Yes Yes Yes Yes Yes  Type of Advance Directive - Out of facility DNR (pink MOST or yellow form) [place];Living will [employer];Living will;Out of facility DNR (pink MOST or yellow form) [place];Living will Living will Living will  Does patient want to make changes to medical advance directive? - No - Patient declined No - Patient declined - No - Patient declined - No - Patient declined  Copy of [place] in Chart? - - No - copy requested Yes Yes Yes Yes  Would patient like information on creating a medical advance directive? No - Patient declined - - - - - -  Pre-existing out of facility DNR order (yellow form or pink MOST form) - Pink MOST form placed in chart (order not valid for inpatient use);Yellow form placed in chart (order not valid for inpatient use) - Yellow form placed in chart (order not valid for inpatient use);Pink MOST form placed in chart (order not valid for inpatient use) - - -    Tobacco Social History   Tobacco Use  Smoking Status Never Smoker  Smokeless Tobacco Never Used     Counseling given: Not Answered   Clinical Intake:  Pre-visit preparation completed: No  Pain : No/denies pain     BMI - recorded: 16.14 Nutritional Status: BMI <19  Underweight Nutritional Risks: None Diabetes: No  How often do you need to have someone help you when  you read instructions, pamphlets, or other written materials from your doctor or pharmacy?: 1 - Never What is the last grade level you completed in school?: Masters Degree  Interpreter Needed?: No  Information entered by :: Allessandra Bernardi FNP-C  Past Medical History:  Diagnosis Date  . Bradycardia    a. s/p MDT dual chamber PPM   . Esophageal stricture   . GERD (gastroesophageal reflux disease)   . Skin cancer   . Syncope   . Tachycardia    asyptomatic nonsustained   Past Surgical History:  Procedure Laterality Date  . CATARACT EXTRACTION  12/10/2011   right   . CATARACT EXTRACTION  12/17/2011   left  . PACEMAKER PLACEMENT  2008   MDT dual chamber PPM implanted by Dr Caryl Comes for symptomatic bradycardia  . PPM GENERATOR CHANGEOUT N/A 03/04/2017   Procedure: PPM GENERATOR CHANGEOUT;  Surgeon: Deboraha Sprang, MD;  Location: [facility];  Service: Cardiovascular;  Laterality: N/A;  . SKIN CANCER EXCISION    . TONSILLECTOMY AND ADENOIDECTOMY     Family History  Problem Relation Age of Onset  . Heart disease Mother   . Colon cancer Neg Hx    Social History   Socioeconomic History  . Marital status: Widowed    Spouse name: Not on file  . Number of children: 0  . Years of education: Not on  file  . Highest education level: Not on file  Occupational History  . Occupation: retired Tour manager  . Financial resource strain: Not hard at all  . Food insecurity    Worry: Never true    Inability: Never true  . Transportation needs    Medical: No    Non-medical: No  Tobacco Use  . Smoking status: Never Smoker  . Smokeless tobacco: Never Used  Substance and Sexual Activity  . Alcohol use: No  . Drug use: No  . Sexual activity: Not on file  Lifestyle  . Physical activity    Days per week: 7 days    Minutes per session: 150+ min  . Stress: Not at all  Relationships  . Social connections    Talks on phone: More than three times a week    Gets together: More  than three times a week    Attends religious service: More than 4 times per year    Active member of club or organization: No    Attends meetings of clubs or organizations: Never    Relationship status: Widowed  Other Topics Concern  . Not on file  Social History Narrative   Widow -husband died 08-09-2003   Lives alone   Never smoked   Alcohol none   Walks 8 miles daily 5 days a week    Living Will    Outpatient Encounter Medications as of 11/02/2018  Medication Sig  . Calcium Carbonate-Vitamin D (OSCAL 500/200 D-3 PO) Take 1 tablet by mouth daily.   . Cholecalciferol (VITAMIN D3) 2000 units TABS Take 2,000 Units by mouth daily.  . Misc Natural Product [facility] EYE DROPS #2 OP) Place 1 drop into both eyes daily as needed (for blurry eyes/irritation.). Complete Eye Relief  . Multiple Vitamin (MULTIVITAMIN WITH MINERALS) TABS tablet Take 1 tablet by mouth daily with breakfast.  . Nutritional Supplements (ENSURE ACTIVE PO) Take 1 Bottle by mouth.   No facility-administered encounter medications on file as of 11/02/2018.     Activities of Daily Living In your present state of health, do you have any difficulty performing the following activities: 11/02/2018  Hearing? N  Vision? N  Difficulty concentrating or making decisions? N  Walking or climbing stairs? N  Dressing or bathing? N  Doing errands, shopping? N  Preparing Food and eating ? N  Using the Toilet? N  In the past six months, have you accidently leaked urine? N  Do you have problems with loss of bowel control? N  Managing your Medications? N  Managing your Finances? N  Housekeeping or managing your Housekeeping? N  Some recent data might be hidden    Patient Care Team: Gayland Curry, DO as PCP - General (Geriatric Medicine) Marygrace Drought, MD as Consulting Physician (Ophthalmology)    Assessment:   This is a routine wellness examination for Demesha.  Exercise Activities and Dietary recommendations Current Exercise  Habits: Structured exercise class, Type of exercise: walking, Time (Minutes): > 60, Frequency (Times/Week): 5, Weekly Exercise (Minutes/Week): 0, Intensity: Moderate, Exercise limited by: None identified  Goals    . patient stated     Starting 09/01/2016 I will try to eat dinner every night of the week.       Fall Risk Fall Risk  11/02/2018 11/02/2018 11/01/2018 09/21/2017 09/01/2016  Falls in the past year? 0 0 0 No Yes  Number falls in past yr: 0 0 0 - -  Injury with Fall? 0 0 0 - -  Is the patient's home free of loose throw rugs in walkways, pet beds, electrical cords, etc?   no      Grab bars in the bathroom? no      Handrails on the stairs?   no      Adequate lighting?   yes  Depression Screen PHQ 2/9 Scores 11/02/2018 11/02/2018 11/01/2018 09/21/2017  PHQ - 2 Score 0 0 0 0     Cognitive Function MMSE - Mini Mental State Exam 11/02/2018 09/21/2017 09/01/2016 08/31/2015 08/25/2014  Orientation to time 5 5 5 5 5   Orientation to Place 5 5 5 5 5   Registration 3 3 3 3 3   Attention/ Calculation 5 5 5 5 5   Recall 3 2 3 3 2   Language- name 2 objects 2 2 2 2 2   Language- repeat 1 1 1 1 1   Language- follow 3 step command 3 3 3 3 3   Language- read & follow direction 1 1 1 1 1   Write a sentence 1 1 1 1 1   Copy design 1 1 1 1 1   Total score 30 29 30 30 29         Immunization History  Administered Date(s) Administered  . Influenza, High Dose Seasonal PF 01/29/2017, 02/04/2018  . Influenza-Unspecified 01/25/2013, 02/05/2016  . Pneumococcal Conjugate-13 08/25/2014, 02/05/2016  . Pneumococcal Polysaccharide-23 08/26/2011  . Tdap 07/11/2011  . Zoster 08/23/2013, 09/04/2016  . Zoster Recombinat (Shingrix) 12/04/2016    Qualifies for Shingles Vaccine? Up to date   Screening Tests Health Maintenance  Topic Date Due  . INFLUENZA VACCINE  12/11/2018  . TETANUS/TDAP  07/10/2021  . DEXA SCAN  Completed  . PNA vac Low Risk Adult  Completed    Cancer Screenings: Lung: Low Dose CT Chest  recommended if Age 14-80 years, 30 pack-year currently smoking OR have quit w/in 15years. Patient does not qualify. Breast:  Up to date on Mammogram? Yes   Up to date of Bone Density/Dexa? Yes Colorectal: N/A   Additional Screenings: Hepatitis C Screening: Low Risk   Plan:  -continue to exercise by walking as tolerated at least 30 minutes three times a week. - Heart healthy diet I have personally reviewed and noted the following in the patient's chart:   . Medical and social history . Use of alcohol, tobacco or illicit drugs  . Current medications and supplements . Functional ability and status . Nutritional status . Physical activity . Advanced directives . List of other physicians . Hospitalizations, surgeries, and ER visits in previous 12 months . Vitals . Screenings to include cognitive, depression, and falls . Referrals and appointments  In addition, I have reviewed and discussed with patient certain preventive protocols, quality metrics, and best practice recommendations. A written personalized care plan for preventive services as well as general preventive health recommendations were provided to patient.  Sandrea Hughs, NP  11/02/2018

## 2018-11-02 NOTE — Patient Instructions (Signed)
Alexandria Wright , Thank you for taking time to come for your Medicare Wellness Visit. I appreciate your ongoing commitment to your health goals. Please review the following plan we discussed and let me know if I can assist you in the future.   Screening recommendations/referrals: Colonoscopy N/A  Mammogram  Up to date  Bone Density  Up to date  Recommended yearly ophthalmology/optometry visit for glaucoma screening and checkup Recommended yearly dental visit for hygiene and checkup  Vaccinations: Influenza vaccine  Up to date  Pneumococcal vaccine  Up to date  Tdap vaccine Up to date due 2023  Shingles vaccine  Up to date     Advanced directives: No   Conditions/risks identified: Advance age female > 41 yrs  Next appointment: 1 year    Preventive Care 33 Years and Older, Female Preventive care refers to lifestyle choices and visits with your health care provider that can promote health and wellness. What does preventive care include?  A yearly physical exam. This is also called an annual well check.  Dental exams once or twice a year.  Routine eye exams. Ask your health care provider how often you should have your eyes checked.  Personal lifestyle choices, including:  Daily care of your teeth and gums.  Regular physical activity.  Eating a healthy diet.  Avoiding tobacco and drug use.  Limiting alcohol use.  Practicing safe sex.  Taking low-dose aspirin every day.  Taking vitamin and mineral supplements as recommended by your health care provider. What happens during an annual well check? The services and screenings done by your health care provider during your annual well check will depend on your age, overall health, lifestyle risk factors, and family history of disease. Counseling  Your health care provider may ask you questions about your:  Alcohol use.  Tobacco use.  Drug use.  Emotional well-being.  Home and relationship well-being.  Sexual  activity.  Eating habits.  History of falls.  Memory and ability to understand (cognition).  Work and work Statistician.  Reproductive health. Screening  You may have the following tests or measurements:  Height, weight, and BMI.  Blood pressure.  Lipid and cholesterol levels. These may be checked every 5 years, or more frequently if you are over 59 years old.  Skin check.  Lung cancer screening. You may have this screening every year starting at age 47 if you have a 30-pack-year history of smoking and currently smoke or have quit within the past 15 years.  Fecal occult blood test (FOBT) of the stool. You may have this test every year starting at age 42.  Flexible sigmoidoscopy or colonoscopy. You may have a sigmoidoscopy every 5 years or a colonoscopy every 10 years starting at age 20.  Hepatitis C blood test.  Hepatitis B blood test.  Sexually transmitted disease (STD) testing.  Diabetes screening. This is done by checking your blood sugar (glucose) after you have not eaten for a while (fasting). You may have this done every 1-3 years.  Bone density scan. This is done to screen for osteoporosis. You may have this done starting at age 35.  Mammogram. This may be done every 1-2 years. Talk to your health care provider about how often you should have regular mammograms. Talk with your health care provider about your test results, treatment options, and if necessary, the need for more tests. Vaccines  Your health care provider may recommend certain vaccines, such as:  Influenza vaccine. This is recommended every year.  Tetanus, diphtheria, and acellular pertussis (Tdap, Td) vaccine. You may need a Td booster every 10 years.  Zoster vaccine. You may need this after age 46.  Pneumococcal 13-valent conjugate (PCV13) vaccine. One dose is recommended after age 35.  Pneumococcal polysaccharide (PPSV23) vaccine. One dose is recommended after age 53. Talk to your health care  provider about which screenings and vaccines you need and how often you need them. This information is not intended to replace advice given to you by your health care provider. Make sure you discuss any questions you have with your health care provider. Document Released: 05/25/2015 Document Revised: 01/16/2016 Document Reviewed: 02/27/2015 Elsevier Interactive Patient Education  2017 [place] Prevention in the Home Falls can cause injuries. They can happen to people of all ages. There are many things you can do to make your home safe and to help prevent falls. What can I do on the outside of my home?  Regularly fix the edges of walkways and driveways and fix any cracks.  Remove anything that might make you trip as you walk through a door, such as a raised step or threshold.  Trim any bushes or trees on the path to your home.  Use bright outdoor lighting.  Clear any walking paths of anything that might make someone trip, such as rocks or tools.  Regularly check to see if handrails are loose or broken. Make sure that both sides of any steps have handrails.  Any raised decks and porches should have guardrails on the edges.  Have any leaves, snow, or ice cleared regularly.  Use sand or salt on walking paths during winter.  Clean up any spills in your garage right away. This includes oil or grease spills. What can I do in the bathroom?  Use night lights.  Install grab bars by the toilet and in the tub and shower. Do not use towel bars as grab bars.  Use non-skid mats or decals in the tub or shower.  If you need to sit down in the shower, use a plastic, non-slip stool.  Keep the floor dry. Clean up any water that spills on the floor as soon as it happens.  Remove soap buildup in the tub or shower regularly.  Attach bath mats securely with double-sided non-slip rug tape.  Do not have throw rugs and other things on the floor that can make you trip. What can I do in  the bedroom?  Use night lights.  Make sure that you have a light by your bed that is easy to reach.  Do not use any sheets or blankets that are too big for your bed. They should not hang down onto the floor.  Have a firm chair that has side arms. You can use this for support while you get dressed.  Do not have throw rugs and other things on the floor that can make you trip. What can I do in the kitchen?  Clean up any spills right away.  Avoid walking on wet floors.  Keep items that you use a lot in easy-to-reach places.  If you need to reach something above you, use a strong step stool that has a grab bar.  Keep electrical cords out of the way.  Do not use floor polish or wax that makes floors slippery. If you must use wax, use non-skid floor wax.  Do not have throw rugs and other things on the floor that can make you trip. What can I do  with my stairs?  Do not leave any items on the stairs.  Make sure that there are handrails on both sides of the stairs and use them. Fix handrails that are broken or loose. Make sure that handrails are as long as the stairways.  Check any carpeting to make sure that it is firmly attached to the stairs. Fix any carpet that is loose or worn.  Avoid having throw rugs at the top or bottom of the stairs. If you do have throw rugs, attach them to the floor with carpet tape.  Make sure that you have a light switch at the top of the stairs and the bottom of the stairs. If you do not have them, ask someone to add them for you. What else can I do to help prevent falls?  Wear shoes that:  Do not have high heels.  Have rubber bottoms.  Are comfortable and fit you well.  Are closed at the toe. Do not wear sandals.  If you use a stepladder:  Make sure that it is fully opened. Do not climb a closed stepladder.  Make sure that both sides of the stepladder are locked into place.  Ask someone to hold it for you, if possible.  Clearly mark and  make sure that you can see:  Any grab bars or handrails.  First and last steps.  Where the edge of each step is.  Use tools that help you move around (mobility aids) if they are needed. These include:  Canes.  Walkers.  Scooters.  Crutches.  Turn on the lights when you go into a dark area. Replace any light bulbs as soon as they burn out.  Set up your furniture so you have a clear path. Avoid moving your furniture around.  If any of your floors are uneven, fix them.  If there are any pets around you, be aware of where they are.  Review your medicines with your doctor. Some medicines can make you feel dizzy. This can increase your chance of falling. Ask your doctor what other things that you can do to help prevent falls. This information is not intended to replace advice given to you by your health care provider. Make sure you discuss any questions you have with your health care provider. Document Released: 02/22/2009 Document Revised: 10/04/2015 Document Reviewed: 06/02/2014 Elsevier Interactive Patient Education  2017 [employer].

## 2018-12-06 ENCOUNTER — Ambulatory Visit (INDEPENDENT_AMBULATORY_CARE_PROVIDER_SITE_OTHER): Payer: Medicare Other | Admitting: *Deleted

## 2018-12-06 DIAGNOSIS — I495 Sick sinus syndrome: Secondary | ICD-10-CM

## 2018-12-06 LAB — CUP PACEART REMOTE DEVICE CHECK
Battery Impedance: 153 Ohm
Battery Remaining Longevity: 133 mo
Battery Voltage: 2.78 V
Brady Statistic AP VP Percent: 0 %
Brady Statistic AP VS Percent: 3 %
Brady Statistic AS VP Percent: 1 %
Brady Statistic AS VS Percent: 96 %
Date Time Interrogation Session: 20200727122753
Implantable Lead Implant Date: 20080703
Implantable Lead Implant Date: 20080703
Implantable Lead Location: 753859
Implantable Lead Location: 753860
Implantable Lead Model: 5076
Implantable Lead Model: 5076
Implantable Pulse Generator Implant Date: 20181024
Lead Channel Impedance Value: 535 Ohm
Lead Channel Impedance Value: 590 Ohm
Lead Channel Pacing Threshold Amplitude: 0.625 V
Lead Channel Pacing Threshold Amplitude: 0.625 V
Lead Channel Pacing Threshold Pulse Width: 0.4 ms
Lead Channel Pacing Threshold Pulse Width: 0.4 ms
Lead Channel Setting Pacing Amplitude: 1.5 V
Lead Channel Setting Pacing Amplitude: 2.5 V
Lead Channel Setting Pacing Pulse Width: 0.4 ms
Lead Channel Setting Sensing Sensitivity: 5.6 mV

## 2018-12-24 ENCOUNTER — Encounter: Payer: Self-pay | Admitting: Cardiology

## 2018-12-24 NOTE — Progress Notes (Signed)
Remote pacemaker transmission.   

## 2019-03-08 ENCOUNTER — Telehealth: Payer: Self-pay

## 2019-03-08 NOTE — Telephone Encounter (Signed)
Pt states her monitor has stopped working. She called [employer] tech support and they states they are sending her a new one in 7-10 business days. I cancelled her appointment for today and asked her to call me when she get the new monitor. The pt verbalized understanding.

## 2019-03-14 ENCOUNTER — Ambulatory Visit (INDEPENDENT_AMBULATORY_CARE_PROVIDER_SITE_OTHER): Payer: Medicare Other | Admitting: *Deleted

## 2019-03-14 DIAGNOSIS — I495 Sick sinus syndrome: Secondary | ICD-10-CM

## 2019-03-14 LAB — CUP PACEART REMOTE DEVICE CHECK
Battery Impedance: 154 Ohm
Battery Remaining Longevity: 133 mo
Battery Voltage: 2.78 V
Brady Statistic AP VP Percent: 0 %
Brady Statistic AP VS Percent: 2 %
Brady Statistic AS VP Percent: 1 %
Brady Statistic AS VS Percent: 97 %
Date Time Interrogation Session: 20201102121135
Implantable Lead Implant Date: 20080703
Implantable Lead Implant Date: 20080703
Implantable Lead Location: 753859
Implantable Lead Location: 753860
Implantable Lead Model: 5076
Implantable Lead Model: 5076
Implantable Pulse Generator Implant Date: 20181024
Lead Channel Impedance Value: 518 Ohm
Lead Channel Impedance Value: 615 Ohm
Lead Channel Pacing Threshold Amplitude: 0.5 V
Lead Channel Pacing Threshold Amplitude: 0.5 V
Lead Channel Pacing Threshold Pulse Width: 0.4 ms
Lead Channel Pacing Threshold Pulse Width: 0.4 ms
Lead Channel Setting Pacing Amplitude: 1.5 V
Lead Channel Setting Pacing Amplitude: 2.5 V
Lead Channel Setting Pacing Pulse Width: 0.46 ms
Lead Channel Setting Sensing Sensitivity: 5.6 mV

## 2019-04-11 NOTE — Progress Notes (Signed)
Remote pacemaker transmission.   

## 2019-04-19 ENCOUNTER — Encounter: Payer: Self-pay | Admitting: Internal Medicine

## 2019-06-10 ENCOUNTER — Ambulatory Visit: Payer: Self-pay

## 2019-06-13 ENCOUNTER — Ambulatory Visit (INDEPENDENT_AMBULATORY_CARE_PROVIDER_SITE_OTHER): Payer: Medicare PPO | Admitting: *Deleted

## 2019-06-13 DIAGNOSIS — I495 Sick sinus syndrome: Secondary | ICD-10-CM

## 2019-06-13 LAB — CUP PACEART REMOTE DEVICE CHECK
Battery Impedance: 178 Ohm
Battery Remaining Longevity: 128 mo
Battery Voltage: 2.78 V
Brady Statistic AP VP Percent: 0 %
Brady Statistic AP VS Percent: 2 %
Brady Statistic AS VP Percent: 1 %
Brady Statistic AS VS Percent: 96 %
Date Time Interrogation Session: 20210201090025
Implantable Lead Implant Date: 20080703
Implantable Lead Implant Date: 20080703
Implantable Lead Location: 753859
Implantable Lead Location: 753860
Implantable Lead Model: 5076
Implantable Lead Model: 5076
Implantable Pulse Generator Implant Date: 20181024
Lead Channel Impedance Value: 518 Ohm
Lead Channel Impedance Value: 621 Ohm
Lead Channel Pacing Threshold Amplitude: 0.5 V
Lead Channel Pacing Threshold Amplitude: 0.5 V
Lead Channel Pacing Threshold Pulse Width: 0.4 ms
Lead Channel Pacing Threshold Pulse Width: 0.4 ms
Lead Channel Setting Pacing Amplitude: 1.5 V
Lead Channel Setting Pacing Amplitude: 2.5 V
Lead Channel Setting Pacing Pulse Width: 0.4 ms
Lead Channel Setting Sensing Sensitivity: 5.6 mV

## 2019-06-13 NOTE — Progress Notes (Signed)
PPM Remote  

## 2019-06-16 ENCOUNTER — Ambulatory Visit: Payer: Medicare PPO | Attending: Internal Medicine

## 2019-06-16 DIAGNOSIS — Z23 Encounter for immunization: Secondary | ICD-10-CM | POA: Insufficient documentation

## 2019-06-16 NOTE — Progress Notes (Signed)
   Covid-19 Vaccination Clinic  Name:  Alexandria Wright    MRN: [id number] DOB: May 24, 1929  06/16/2019  Ms. Ortlip was observed post Covid-19 immunization for 15 minutes without incidence. She was provided with Vaccine Information Sheet and instruction to access the V-Safe system.   Ms. Vile was instructed to call 911 with any severe reactions post vaccine: Marland Kitchen Difficulty breathing  . Swelling of your face and throat  . A fast heartbeat  . A bad rash all over your body  . Dizziness and weakness    Immunizations Administered    Name Date Dose VIS Date Route   Pfizer COVID-19 Vaccine 06/16/2019  8:43 AM 0.3 mL 04/22/2019 Intramuscular   Manufacturer: [place]   Lot: [id number]   [place]: [id number]

## 2019-07-13 ENCOUNTER — Ambulatory Visit: Payer: Medicare PPO | Attending: Internal Medicine

## 2019-07-13 DIAGNOSIS — Z23 Encounter for immunization: Secondary | ICD-10-CM | POA: Insufficient documentation

## 2019-07-13 NOTE — Progress Notes (Signed)
   Covid-19 Vaccination Clinic  Name:  DRUCELLA [facility]    MRN: [id number] DOB: [birth year]  07/13/2019  Ms. Kurczewski was observed post Covid-19 immunization for 15 minutes without incident. She was provided with Vaccine Information Sheet and instruction to access the V-Safe system.   Ms. Barsky was instructed to call 911 with any severe reactions post vaccine: Marland Kitchen Difficulty breathing  . Swelling of face and throat  . A fast heartbeat  . A bad rash all over body  . Dizziness and weakness   Immunizations Administered    Name Date Dose VIS Date Route   Pfizer COVID-19 Vaccine 07/13/2019  8:49 AM 0.3 mL 04/22/2019 Intramuscular   Manufacturer: [place]   Lot: [id number]   [place]: [id number]

## 2019-08-03 DIAGNOSIS — D1801 Hemangioma of skin and subcutaneous tissue: Secondary | ICD-10-CM | POA: Diagnosis not present

## 2019-08-03 DIAGNOSIS — L821 Other seborrheic keratosis: Secondary | ICD-10-CM | POA: Diagnosis not present

## 2019-08-03 DIAGNOSIS — L820 Inflamed seborrheic keratosis: Secondary | ICD-10-CM | POA: Diagnosis not present

## 2019-08-03 DIAGNOSIS — C44612 Basal cell carcinoma of skin of right upper limb, including shoulder: Secondary | ICD-10-CM | POA: Diagnosis not present

## 2019-08-03 DIAGNOSIS — Z85828 Personal history of other malignant neoplasm of skin: Secondary | ICD-10-CM | POA: Diagnosis not present

## 2019-08-03 DIAGNOSIS — L570 Actinic keratosis: Secondary | ICD-10-CM | POA: Diagnosis not present

## 2019-09-12 ENCOUNTER — Telehealth: Payer: Self-pay | Admitting: Internal Medicine

## 2019-09-12 ENCOUNTER — Ambulatory Visit (INDEPENDENT_AMBULATORY_CARE_PROVIDER_SITE_OTHER): Payer: Medicare PPO | Admitting: *Deleted

## 2019-09-12 DIAGNOSIS — I495 Sick sinus syndrome: Secondary | ICD-10-CM | POA: Diagnosis not present

## 2019-09-12 NOTE — Telephone Encounter (Signed)
Called patient and reviewed to send transmission when she received her new box.Verbalizes understanding.

## 2019-09-12 NOTE — Telephone Encounter (Signed)
Patient had to send in a transmission today but states that the monitor for her pace maker is not working. She contacted the company and they are going to send her a new one. Just a [place]

## 2019-09-13 ENCOUNTER — Telehealth: Payer: Self-pay

## 2019-09-13 NOTE — Telephone Encounter (Signed)
Unable to speak  with patient to remind of missed remote transmission 

## 2019-09-16 ENCOUNTER — Telehealth: Payer: Self-pay

## 2019-09-16 LAB — CUP PACEART REMOTE DEVICE CHECK
Battery Impedance: 178 Ohm
Battery Remaining Longevity: 128 mo
Battery Voltage: 2.78 V
Brady Statistic AP VP Percent: 0 %
Brady Statistic AP VS Percent: 3 %
Brady Statistic AS VP Percent: 1 %
Brady Statistic AS VS Percent: 96 %
Date Time Interrogation Session: 20210507091510
Implantable Lead Implant Date: 20080703
Implantable Lead Implant Date: 20080703
Implantable Lead Location: 753859
Implantable Lead Location: 753860
Implantable Lead Model: 5076
Implantable Lead Model: 5076
Implantable Pulse Generator Implant Date: 20181024
Lead Channel Impedance Value: 481 Ohm
Lead Channel Impedance Value: 562 Ohm
Lead Channel Pacing Threshold Amplitude: 0.5 V
Lead Channel Pacing Threshold Amplitude: 0.5 V
Lead Channel Pacing Threshold Pulse Width: 0.4 ms
Lead Channel Pacing Threshold Pulse Width: 0.4 ms
Lead Channel Setting Pacing Amplitude: 1.5 V
Lead Channel Setting Pacing Amplitude: 2.5 V
Lead Channel Setting Pacing Pulse Width: 0.4 ms
Lead Channel Setting Sensing Sensitivity: 5.6 mV

## 2019-09-16 NOTE — Telephone Encounter (Signed)
Patient called letting us know that she has received her monitor and she will send a transmission to assure that it is working well.

## 2019-09-19 NOTE — Progress Notes (Signed)
Remote pacemaker transmission.   

## 2019-10-31 ENCOUNTER — Other Ambulatory Visit: Payer: Medicare PPO

## 2019-10-31 ENCOUNTER — Other Ambulatory Visit: Payer: Self-pay

## 2019-10-31 DIAGNOSIS — I10 Essential (primary) hypertension: Secondary | ICD-10-CM

## 2019-10-31 DIAGNOSIS — E7800 Pure hypercholesterolemia, unspecified: Secondary | ICD-10-CM | POA: Diagnosis not present

## 2019-10-31 DIAGNOSIS — R739 Hyperglycemia, unspecified: Secondary | ICD-10-CM | POA: Diagnosis not present

## 2019-10-31 DIAGNOSIS — R636 Underweight: Secondary | ICD-10-CM

## 2019-10-31 DIAGNOSIS — Z681 Body mass index (BMI) 19 or less, adult: Secondary | ICD-10-CM

## 2019-10-31 DIAGNOSIS — Z0000 Encounter for general adult medical examination without abnormal findings: Secondary | ICD-10-CM

## 2019-11-01 LAB — COMPLETE METABOLIC PANEL WITH GFR
AG Ratio: 1.7 (calc) (ref 1.0–2.5)
ALT: 8 U/L (ref 6–29)
AST: 16 U/L (ref 10–35)
Albumin: 4.5 g/dL (ref 3.6–5.1)
Alkaline phosphatase (APISO): 61 U/L (ref 37–153)
BUN/Creatinine Ratio: 15 (calc) (ref 6–22)
BUN: 14 mg/dL (ref 7–25)
CO2: 25 mmol/L (ref 20–32)
Calcium: 9.7 mg/dL (ref 8.6–10.4)
Chloride: 102 mmol/L (ref 98–110)
Creat: 0.94 mg/dL — ABNORMAL HIGH (ref 0.60–0.88)
GFR, Est African American: 63 mL/min/{1.73_m2} (ref >=60)
GFR, Est Non African American: 55 mL/min/{1.73_m2} — ABNORMAL LOW (ref >=60)
Globulin: 2.7 g/dL (calc) (ref 1.9–3.7)
Glucose, Bld: 96 mg/dL (ref 65–99)
Potassium: 4.8 mmol/L (ref 3.5–5.3)
Sodium: 140 mmol/L (ref 135–146)
Total Bilirubin: 0.5 mg/dL (ref 0.2–1.2)
Total Protein: 7.2 g/dL (ref 6.1–8.1)

## 2019-11-01 LAB — CBC WITH DIFFERENTIAL/PLATELET
Absolute Monocytes: 707 cells/uL (ref 200–950)
Basophils Absolute: 63 cells/uL (ref 0–200)
Basophils Relative: 0.9 %
Eosinophils Absolute: 189 cells/uL (ref 15–500)
Eosinophils Relative: 2.7 %
HCT: 48.5 % — ABNORMAL HIGH (ref 35.0–45.0)
Hemoglobin: 16 g/dL — ABNORMAL HIGH (ref 11.7–15.5)
Lymphs Abs: 1099 cells/uL (ref 850–3900)
MCH: 31.6 pg (ref 27.0–33.0)
MCHC: 33 g/dL (ref 32.0–36.0)
MCV: 95.8 fL (ref 80.0–100.0)
MPV: 10.6 fL (ref 7.5–12.5)
Monocytes Relative: 10.1 %
Neutro Abs: 4942 cells/uL (ref 1500–7800)
Neutrophils Relative %: 70.6 %
Platelets: 242 10*3/uL (ref 140–400)
RBC: 5.06 10*6/uL (ref 3.80–5.10)
RDW: 12.5 % (ref 11.0–15.0)
Total Lymphocyte: 15.7 %
WBC: 7 10*3/uL (ref 3.8–10.8)

## 2019-11-01 LAB — HEMOGLOBIN A1C
Hgb A1c MFr Bld: 5.1 % of total Hgb (ref <5.7)
Mean Plasma Glucose: 100 (calc)
eAG (mmol/L): 5.5 (calc)

## 2019-11-01 LAB — LIPID PANEL
Cholesterol: 224 mg/dL — ABNORMAL HIGH (ref <200)
HDL: 65 mg/dL (ref >=50)
LDL Cholesterol (Calc): 134 mg/dL (calc) — ABNORMAL HIGH
Non-HDL Cholesterol (Calc): 159 mg/dL (calc) — ABNORMAL HIGH (ref <130)
Total CHOL/HDL Ratio: 3.4 (calc) (ref <5.0)
Triglycerides: 137 mg/dL (ref <150)

## 2019-11-01 LAB — TSH: TSH: 1.25 mIU/L (ref 0.40–4.50)

## 2019-11-01 NOTE — Progress Notes (Signed)
Labs look great except cholesterol which always runs a little high.  It has improved though from prior visits.  We can discuss at her annual.

## 2019-11-03 ENCOUNTER — Encounter: Payer: Medicare Other | Admitting: Internal Medicine

## 2019-11-03 ENCOUNTER — Encounter: Payer: Self-pay | Admitting: Nurse Practitioner

## 2019-11-03 ENCOUNTER — Other Ambulatory Visit: Payer: Self-pay

## 2019-11-03 ENCOUNTER — Telehealth: Payer: Self-pay

## 2019-11-03 ENCOUNTER — Ambulatory Visit (INDEPENDENT_AMBULATORY_CARE_PROVIDER_SITE_OTHER): Payer: Medicare PPO | Admitting: Nurse Practitioner

## 2019-11-03 DIAGNOSIS — Z0000 Encounter for general adult medical examination without abnormal findings: Secondary | ICD-10-CM | POA: Diagnosis not present

## 2019-11-03 NOTE — Progress Notes (Signed)
Subjective:   Alexandria Wright is a 86 y.o. female who presents for Medicare Annual (Subsequent) preventive examination.  Review of Systems     Cardiac Risk Factors include: dyslipidemia;family history of premature cardiovascular disease;advanced age (>22men, >78 women)     Objective:    There were no vitals filed for this visit. There is no height or weight on file to calculate BMI.  Advanced Directives 11/03/2019 11/02/2018 09/21/2017 03/04/2017 09/05/2016 09/01/2016 08/31/2015  Does Patient Have a Medical Advance Directive? Yes No Yes Yes Yes Yes Yes  Type of Advance Directive Living will;Out of facility DNR (pink MOST or yellow form) - Out of facility DNR (pink MOST or yellow form) [place];Living will [place];Living will;Out of facility DNR (pink MOST or yellow form) [place];Living will Living will  Does patient want to make changes to medical advance directive? No - Patient declined - No - Patient declined No - Patient declined - No - Patient declined -  Copy of [place] in Chart? - - - No - copy requested Yes Yes Yes  Would patient like information on creating a medical advance directive? - No - Patient declined - - - - -  Pre-existing out of facility DNR order (yellow form or pink MOST form) Yellow form placed in chart (order not valid for inpatient use) - Pink MOST form placed in chart (order not valid for inpatient use);Yellow form placed in chart (order not valid for inpatient use) - Yellow form placed in chart (order not valid for inpatient use);Pink MOST form placed in chart (order not valid for inpatient use) - -    Current Medications (verified) Outpatient Encounter Medications as of 11/03/2019  Medication Sig  . Calcium Carbonate-Vitamin D (OSCAL 500/200 D-3 PO) Take 1 tablet by mouth daily.   . Cholecalciferol (VITAMIN D3) 2000 units TABS Take 2,000 Units by mouth daily.  . Misc Natural Product  [facility] EYE DROPS #2 OP) Place 1 drop into both eyes daily as needed (for blurry eyes/irritation.). Complete Eye Relief  . Multiple Vitamin (MULTIVITAMIN WITH MINERALS) TABS tablet Take 1 tablet by mouth daily with breakfast.  . Nutritional Supplements (ENSURE ACTIVE PO) Take 1 Bottle by mouth.   No facility-administered encounter medications on file as of 11/03/2019.    Allergies (verified) Sulfonamide derivatives and Penicillins   History: Past Medical History:  Diagnosis Date  . Bradycardia    a. s/p MDT dual chamber PPM   . Esophageal stricture   . GERD (gastroesophageal reflux disease)   . Skin cancer   . Syncope   . Tachycardia    asyptomatic nonsustained   Past Surgical History:  Procedure Laterality Date  . CATARACT EXTRACTION  12/10/2011   right   . CATARACT EXTRACTION  12/17/2011   left  . PACEMAKER PLACEMENT  2008   MDT dual chamber PPM implanted by Dr Caryl Comes for symptomatic bradycardia  . PPM GENERATOR CHANGEOUT N/A 03/04/2017   Procedure: PPM GENERATOR CHANGEOUT;  Surgeon: Deboraha Sprang, MD;  Location: [facility];  Service: Cardiovascular;  Laterality: N/A;  . SKIN CANCER EXCISION    . TONSILLECTOMY AND ADENOIDECTOMY     Family History  Problem Relation Age of Onset  . Heart disease Mother   . Colon cancer Neg Hx    Social History   Socioeconomic History  . Marital status: Widowed    Spouse name: Not on file  . Number of children: 0  .  Years of education: Not on file  . Highest education level: Not on file  Occupational History  . Occupation: retired Pharmacist, hospital  Tobacco Use  . Smoking status: Never Smoker  . Smokeless tobacco: Never Used  Substance and Sexual Activity  . Alcohol use: No  . Drug use: No  . Sexual activity: Not on file  Other Topics Concern  . Not on file  Social History Narrative   Widow -husband died Aug 14, 2003   Lives alone   Never smoked   Alcohol none   Walks 8 miles daily 5 days a week    Living Will   Social  Determinants of Health   Financial Resource Strain:   . Difficulty of Paying Living Expenses:   Food Insecurity:   . Worried About Charity fundraiser in the Last Year:   . Arboriculturist in the Last Year:   Transportation Needs:   . Film/video editor (Medical):   Marland Kitchen Lack of Transportation (Non-Medical):   Physical Activity:   . Days of Exercise per Week:   . Minutes of Exercise per Session:   Stress:   . Feeling of Stress :   Social Connections:   . Frequency of Communication with Friends and Family:   . Frequency of Social Gatherings with Friends and Family:   . Attends Religious Services:   . Active Member of Clubs or Organizations:   . Attends Archivist Meetings:   Marland Kitchen Marital Status:     Tobacco Counseling Counseling given: Not Answered   Clinical Intake:  Pre-visit preparation completed: Yes  Pain : No/denies pain     BMI - recorded: 16 Nutritional Status: BMI <19  Underweight Nutritional Risks: None Diabetes: No  How often do you need to have someone help you when you read instructions, pamphlets, or other written materials from your doctor or pharmacy?: 1 - Never  Diabetic?no         Activities of Daily Living In your present state of health, do you have any difficulty performing the following activities: 11/03/2019  Hearing? N  Vision? N  Difficulty concentrating or making decisions? N  Walking or climbing stairs? N  Dressing or bathing? N  Doing errands, shopping? N  Preparing Food and eating ? N  Using the Toilet? N  In the past six months, have you accidently leaked urine? N  Do you have problems with loss of bowel control? N  Managing your Medications? N  Managing your Finances? N  Housekeeping or managing your Housekeeping? N  Some recent data might be hidden    Patient Care Team: Gayland Curry, DO as PCP - General (Geriatric Medicine) Marygrace Drought, MD as Consulting Physician (Ophthalmology)  Indicate any recent  Medical Services you may have received from other than [facility] providers in the past year (date may be approximate).     Assessment:   This is a routine wellness examination for Alexandria Wright.  Hearing/Vision screen  Hearing Screening   125Hz  250Hz  500Hz  1000Hz  2000Hz  3000Hz  4000Hz  6000Hz  8000Hz   Right ear:           Left ear:           Comments: Patient states that she has no problems with hearing.  Vision Screening Comments: Patient has no vision problems. Has had a recent eye exam  Dietary issues and exercise activities discussed: Current Exercise Habits: Home exercise routine, Type of exercise: walking (walking 6 miles a day), Frequency (Times/Week): 5  Goals    .  patient stated     Starting 09/01/2016 I will try to eat dinner every night of the week.      Depression Screen PHQ 2/9 Scores 11/03/2019 11/02/2018 11/02/2018 11/01/2018 09/21/2017 09/01/2016 08/31/2015  PHQ - 2 Score 0 0 0 0 0 0 0    Fall Risk Fall Risk  11/03/2019 11/02/2018 11/02/2018 11/01/2018 09/21/2017  Falls in the past year? 0 0 0 0 No  Number falls in past yr: 0 0 0 0 -  Injury with Fall? 0 0 0 0 -    Any stairs in or around the home? Yes  If so, are there any without handrails? No  Home free of loose throw rugs in walkways, pet beds, electrical cords, etc? Yes  Adequate lighting in your home to reduce risk of falls? Yes   ASSISTIVE DEVICES UTILIZED TO PREVENT FALLS:  Life alert? No  Use of a cane, walker or w/c? No  Grab bars in the bathroom? No  Shower chair or bench in shower? No  Elevated toilet seat or a handicapped toilet? No    Cognitive Function: MMSE - Mini Mental State Exam 11/02/2018 09/21/2017 09/01/2016 08/31/2015 08/25/2014  Orientation to time 5 5 5 5 5   Orientation to Place 5 5 5 5 5   Registration 3 3 3 3 3   Attention/ Calculation 5 5 5 5 5   Recall 3 2 3 3 2   Language- name 2 objects 2 2 2 2 2   Language- repeat 1 1 1 1 1   Language- follow 3 step command 3 3 3 3 3   Language- read & follow  direction 1 1 1 1 1   Write a sentence 1 1 1 1 1   Copy design 1 1 1 1 1   Total score 30 29 30 30 29      6CIT Screen 11/03/2019  What Year? 0 points  What month? 0 points  What time? 0 points  Count back from 20 0 points  Months in reverse 0 points    Immunizations Immunization History  Administered Date(s) Administered  . Influenza, High Dose Seasonal PF 01/29/2017, 02/04/2018, 02/24/2019  . Influenza-Unspecified 01/25/2013, 02/05/2016  . PFIZER SARS-COV-2 Vaccination 06/16/2019, 07/13/2019  . Pneumococcal Conjugate-13 08/25/2014, 02/05/2016  . Pneumococcal Polysaccharide-23 08/26/2011  . Tdap 07/11/2011  . Zoster 08/23/2013, 09/04/2016  . Zoster Recombinat (Shingrix) 12/04/2016    TDAP status: Up to date Flu Vaccine status: Up to date Pneumococcal vaccine status: Up to date Covid-19 vaccine status: Completed vaccines  Qualifies for Shingles Vaccine? Yes   Zostavax completed Yes   Shingrix Completed?: Yes  Screening Tests Health Maintenance  Topic Date Due  . INFLUENZA VACCINE  12/11/2019  . TETANUS/TDAP  07/10/2021  . DEXA SCAN  Completed  . COVID-19 Vaccine  Completed  . PNA vac Low Risk Adult  Completed    Health Maintenance  There are no preventive care reminders to display for this patient.  Colorectal cancer screening: No longer required.  Mammogram status: No longer required.  Pt declined bone denisty  Lung Cancer Screening: (Low Dose CT Chest recommended if Age 81-80 years, 30 pack-year currently smoking OR have quit w/in 15years.) does not qualify.   Lung Cancer Screening Referral: na  Additional Screening:  Hepatitis C Screening: does not qualify;   Vision Screening: Recommended annual ophthalmology exams for early detection of glaucoma and other disorders of the eye. Is the patient up to date with their annual eye exam?  Yes  Who is the provider or what is the name  of the office in which the patient attends annual eye exams? Dr Satira Sark If pt is  not established with a provider, would they like to be referred to a provider to establish care? na  Dental Screening: Recommended annual dental exams for proper oral hygiene  Community Resource Referral / Chronic Care Management: CRR required this visit?  No   CCM required this visit?  No      Plan:     I have personally reviewed and noted the following in the patient's chart:   . Medical and social history . Use of alcohol, tobacco or illicit drugs  . Current medications and supplements . Functional ability and status . Nutritional status . Physical activity . Advanced directives . List of other physicians . Hospitalizations, surgeries, and ER visits in previous 12 months . Vitals . Screenings to include cognitive, depression, and falls . Referrals and appointments  In addition, I have reviewed and discussed with patient certain preventive protocols, quality metrics, and best practice recommendations. A written personalized care plan for preventive services as well as general preventive health recommendations were provided to patient.     Lauree Chandler, NP   11/03/2019

## 2019-11-03 NOTE — Progress Notes (Signed)
This service is provided via telemedicine  No vital signs collected/recorded due to the encounter was a telemedicine visit.   Location of patient (ex: home, work):  Home  Patient consents to a telephone visit:  Yes, see encounter dated 11/03/2019  Location of the provider (ex: office, home):  [place]  Name of any referring provider:  Hollace Kinnier, DO  Names of all persons participating in the telemedicine service and their role in the encounter:  Sherrie Mustache, Nurse Practitioner, Carroll Kinds, CMA, and patient.   Time spent on call:  6 minutes with medical assistant

## 2019-11-03 NOTE — Telephone Encounter (Signed)
[name], medellin are scheduled for a virtual visit with your provider today.    Just as we do with appointments in the office, we must obtain your consent to participate.  Your consent will be active for this visit and any virtual visit you may have with one of our providers in the next 365 days.    If you have a MyChart account, I can also send a copy of this consent to you electronically.  All virtual visits are billed to your insurance company just like a traditional visit in the office.  As this is a virtual visit, video technology does not allow for your provider to perform a traditional examination.  This may limit your provider's ability to fully assess your condition.  If your provider identifies any concerns that need to be evaluated in person or the need to arrange testing such as labs, EKG, etc, we will make arrangements to do so.    Although advances in technology are sophisticated, we cannot ensure that it will always work on either your end or our end.  If the connection with a video visit is poor, we may have to switch to a telephone visit.  With either a video or telephone visit, we are not always able to ensure that we have a secure connection.   I need to obtain your verbal consent now.   Are you willing to proceed with your visit today?   [name] has provided verbal consent on 11/03/2019 for a virtual visit (video or telephone).   Carroll Kinds, [facility] 11/03/2019  8:42 AM

## 2019-11-03 NOTE — Patient Instructions (Signed)
[name] , Thank you for taking time to come for your Medicare Wellness Visit. I appreciate your ongoing commitment to your health goals. Please review the following plan we discussed and let me know if I can assist you in the future.   Screening recommendations/referrals: Colonoscopy aged out Mammogram aged out Bone Density you have declined this screening Recommended yearly ophthalmology/optometry visit for glaucoma screening and checkup Recommended yearly dental visit for hygiene and checkup  Vaccinations: Influenza vaccine DUE sept 2021 Pneumococcal vaccine up to date Tdap vaccine up to date Shingles vaccine up to date    Advanced directives: on file.   Conditions/risks identified: advanced age  Next appointment: 1year   Preventive Care 36 Years and Older, Female Preventive care refers to lifestyle choices and visits with your health care provider that can promote health and wellness. What does preventive care include?  A yearly physical exam. This is also called an annual well check.  Dental exams once or twice a year.  Routine eye exams. Ask your health care provider how often you should have your eyes checked.  Personal lifestyle choices, including:  Daily care of your teeth and gums.  Regular physical activity.  Eating a healthy diet.  Avoiding tobacco and drug use.  Limiting alcohol use.  Practicing safe sex.  Taking low-dose aspirin every day.  Taking vitamin and mineral supplements as recommended by your health care provider. What happens during an annual well check? The services and screenings done by your health care provider during your annual well check will depend on your age, overall health, lifestyle risk factors, and family history of disease. Counseling  Your health care provider may ask you questions about your:  Alcohol use.  Tobacco use.  Drug use.  Emotional well-being.  Home and relationship well-being.  Sexual  activity.  Eating habits.  History of falls.  Memory and ability to understand (cognition).  Work and work Statistician.  Reproductive health. Screening  You may have the following tests or measurements:  Height, weight, and BMI.  Blood pressure.  Lipid and cholesterol levels. These may be checked every 5 years, or more frequently if you are over 96 years old.  Skin check.  Lung cancer screening. You may have this screening every year starting at age 27 if you have a 30-pack-year history of smoking and currently smoke or have quit within the past 15 years.  Fecal occult blood test (FOBT) of the stool. You may have this test every year starting at age 15.  Flexible sigmoidoscopy or colonoscopy. You may have a sigmoidoscopy every 5 years or a colonoscopy every 10 years starting at age 37.  Hepatitis C blood test.  Hepatitis B blood test.  Sexually transmitted disease (STD) testing.  Diabetes screening. This is done by checking your blood sugar (glucose) after you have not eaten for a while (fasting). You may have this done every 1-3 years.  Bone density scan. This is done to screen for osteoporosis. You may have this done starting at age 63.  Mammogram. This may be done every 1-2 years. Talk to your health care provider about how often you should have regular mammograms. Talk with your health care provider about your test results, treatment options, and if necessary, the need for more tests. Vaccines  Your health care provider may recommend certain vaccines, such as:  Influenza vaccine. This is recommended every year.  Tetanus, diphtheria, and acellular pertussis (Tdap, Td) vaccine. You may need a Td booster every 10 years.  Zoster vaccine. You may need this after age 64.  Pneumococcal 13-valent conjugate (PCV13) vaccine. One dose is recommended after age 59.  Pneumococcal polysaccharide (PPSV23) vaccine. One dose is recommended after age 55. Talk to your health care  provider about which screenings and vaccines you need and how often you need them. This information is not intended to replace advice given to you by your health care provider. Make sure you discuss any questions you have with your health care provider. Document Released: 05/25/2015 Document Revised: 01/16/2016 Document Reviewed: 02/27/2015 Elsevier Interactive Patient Education  2017 [place] Prevention in the Home Falls can cause injuries. They can happen to people of all ages. There are many things you can do to make your home safe and to help prevent falls. What can I do on the outside of my home?  Regularly fix the edges of walkways and driveways and fix any cracks.  Remove anything that might make you trip as you walk through a door, such as a raised step or threshold.  Trim any bushes or trees on the path to your home.  Use bright outdoor lighting.  Clear any walking paths of anything that might make someone trip, such as rocks or tools.  Regularly check to see if handrails are loose or broken. Make sure that both sides of any steps have handrails.  Any raised decks and porches should have guardrails on the edges.  Have any leaves, snow, or ice cleared regularly.  Use sand or salt on walking paths during winter.  Clean up any spills in your garage right away. This includes oil or grease spills. What can I do in the bathroom?  Use night lights.  Install grab bars by the toilet and in the tub and shower. Do not use towel bars as grab bars.  Use non-skid mats or decals in the tub or shower.  If you need to sit down in the shower, use a plastic, non-slip stool.  Keep the floor dry. Clean up any water that spills on the floor as soon as it happens.  Remove soap buildup in the tub or shower regularly.  Attach bath mats securely with double-sided non-slip rug tape.  Do not have throw rugs and other things on the floor that can make you trip. What can I do in  the bedroom?  Use night lights.  Make sure that you have a light by your bed that is easy to reach.  Do not use any sheets or blankets that are too big for your bed. They should not hang down onto the floor.  Have a firm chair that has side arms. You can use this for support while you get dressed.  Do not have throw rugs and other things on the floor that can make you trip. What can I do in the kitchen?  Clean up any spills right away.  Avoid walking on wet floors.  Keep items that you use a lot in easy-to-reach places.  If you need to reach something above you, use a strong step stool that has a grab bar.  Keep electrical cords out of the way.  Do not use floor polish or wax that makes floors slippery. If you must use wax, use non-skid floor wax.  Do not have throw rugs and other things on the floor that can make you trip. What can I do with my stairs?  Do not leave any items on the stairs.  Make sure that there are  handrails on both sides of the stairs and use them. Fix handrails that are broken or loose. Make sure that handrails are as long as the stairways.  Check any carpeting to make sure that it is firmly attached to the stairs. Fix any carpet that is loose or worn.  Avoid having throw rugs at the top or bottom of the stairs. If you do have throw rugs, attach them to the floor with carpet tape.  Make sure that you have a light switch at the top of the stairs and the bottom of the stairs. If you do not have them, ask someone to add them for you. What else can I do to help prevent falls?  Wear shoes that:  Do not have high heels.  Have rubber bottoms.  Are comfortable and fit you well.  Are closed at the toe. Do not wear sandals.  If you use a stepladder:  Make sure that it is fully opened. Do not climb a closed stepladder.  Make sure that both sides of the stepladder are locked into place.  Ask someone to hold it for you, if possible.  Clearly mark and  make sure that you can see:  Any grab bars or handrails.  First and last steps.  Where the edge of each step is.  Use tools that help you move around (mobility aids) if they are needed. These include:  Canes.  Walkers.  Scooters.  Crutches.  Turn on the lights when you go into a dark area. Replace any light bulbs as soon as they burn out.  Set up your furniture so you have a clear path. Avoid moving your furniture around.  If any of your floors are uneven, fix them.  If there are any pets around you, be aware of where they are.  Review your medicines with your doctor. Some medicines can make you feel dizzy. This can increase your chance of falling. Ask your doctor what other things that you can do to help prevent falls. This information is not intended to replace advice given to you by your health care provider. Make sure you discuss any questions you have with your health care provider. Document Released: 02/22/2009 Document Revised: 10/04/2015 Document Reviewed: 06/02/2014 Elsevier Interactive Patient Education  2017 [employer].

## 2019-11-04 ENCOUNTER — Encounter: Payer: Medicare Other | Admitting: Family

## 2019-11-10 ENCOUNTER — Encounter: Payer: Self-pay | Admitting: Internal Medicine

## 2019-11-10 ENCOUNTER — Other Ambulatory Visit: Payer: Self-pay

## 2019-11-10 ENCOUNTER — Ambulatory Visit: Payer: Medicare PPO | Admitting: Internal Medicine

## 2019-11-10 VITALS — BP 120/82 | HR 95 | Temp 97.5°F | Ht 64.0 in | Wt 98.2 lb

## 2019-11-10 DIAGNOSIS — R636 Underweight: Secondary | ICD-10-CM | POA: Diagnosis not present

## 2019-11-10 DIAGNOSIS — I495 Sick sinus syndrome: Secondary | ICD-10-CM

## 2019-11-10 DIAGNOSIS — R739 Hyperglycemia, unspecified: Secondary | ICD-10-CM | POA: Diagnosis not present

## 2019-11-10 DIAGNOSIS — E7800 Pure hypercholesterolemia, unspecified: Secondary | ICD-10-CM

## 2019-11-10 DIAGNOSIS — Z950 Presence of cardiac pacemaker: Secondary | ICD-10-CM

## 2019-11-10 DIAGNOSIS — Z681 Body mass index (BMI) 19 or less, adult: Secondary | ICD-10-CM | POA: Diagnosis not present

## 2019-11-10 DIAGNOSIS — Z0000 Encounter for general adult medical examination without abnormal findings: Secondary | ICD-10-CM

## 2019-11-10 DIAGNOSIS — I10 Essential (primary) hypertension: Secondary | ICD-10-CM | POA: Diagnosis not present

## 2019-11-10 NOTE — Progress Notes (Signed)
Provider:  Rexene Edison. Mariea Clonts, D.O., C.M.D. Location:   [facility]  Place of Service:   clinic  Previous PCP: Gayland Curry, DO Patient Care Team: Gayland Curry, DO as PCP - General (Geriatric Medicine) Marygrace Drought, MD as Consulting Physician (Ophthalmology)  Extended Emergency Contact Information Primary Emergency Contact: Servando Snare of [place] Phone: [phone] Relation: Sister  Code Status: DNR Goals of Care: Advanced Directive information Advanced Directives 11/10/2019  Does Patient Have a Medical Advance Directive? Yes  Type of Paramedic of [place];Living will;Out of facility DNR (pink MOST or yellow form)  Does patient want to make changes to medical advance directive? No - Guardian declined  Copy of [place] in Chart? Yes - validated most recent copy scanned in chart (See row information)  Would patient like information on creating a medical advance directive? -  Pre-existing out of facility DNR order (yellow form or pink MOST form) Pink MOST/Yellow Form most recent copy in chart - Physician notified to receive inpatient order   Chief Complaint  Patient presents with  . Annual Exam    CPE    HPI: Patient is a 86 y.o. female seen today for an annual physical exam.    She has brought her living will and HCPOA.    Doing just fine.  No problems.  She is still walking 4.5-6 miles, sometimes more, 5 days a week.  Her church has reopened and she goes to church and Sunday school each Sunday.  She sometimes walks extra with her grandniece and the dog.    BP is great.  No dizziness or lightheadedness.    Weight is up 4 lbs.  Cholesterol is down a little.    Sees and hears well.    Does not want mammograms at her age.    Continues to decline cholesterol medication but levels have improved.      Past Medical History:  Diagnosis Date  . Bradycardia    a. s/p MDT dual chamber PPM    . Esophageal stricture   . GERD (gastroesophageal reflux disease)   . Skin cancer   . Syncope   . Tachycardia    asyptomatic nonsustained   Past Surgical History:  Procedure Laterality Date  . CATARACT EXTRACTION  12/10/2011   right   . CATARACT EXTRACTION  12/17/2011   left  . PACEMAKER PLACEMENT  2008   MDT dual chamber PPM implanted by Dr Caryl Comes for symptomatic bradycardia  . PPM GENERATOR CHANGEOUT N/A 03/04/2017   Procedure: PPM GENERATOR CHANGEOUT;  Surgeon: Deboraha Sprang, MD;  Location: [facility];  Service: Cardiovascular;  Laterality: N/A;  . SKIN CANCER EXCISION    . TONSILLECTOMY AND ADENOIDECTOMY      reports that she has never smoked. She has never used smokeless tobacco. She reports that she does not drink alcohol and does not use drugs.  Functional Status Survey:    Family History  Problem Relation Age of Onset  . Heart disease Mother   . Colon cancer Neg Hx     Health Maintenance  Topic Date Due  . INFLUENZA VACCINE  12/11/2019  . TETANUS/TDAP  07/10/2021  . DEXA SCAN  Completed  . COVID-19 Vaccine  Completed  . PNA vac Low Risk Adult  Completed    Allergies  Allergen Reactions  . Sulfonamide Derivatives Other (See Comments)    Made very sick, per  pt   . Penicillins Swelling and Rash    Has patient had a PCN reaction causing immediate rash, facial/tongue/throat swelling, SOB or lightheadedness with hypotension: Yes Has patient had a PCN reaction causing severe rash involving mucus membranes or skin necrosis: Yes Has patient had a PCN reaction that required hospitalization: No Has patient had a PCN reaction occurring within the last 10 years: No If all of the above answers are "NO", then may proceed with Cephalosporin use.     Outpatient Encounter Medications as of 11/10/2019  Medication Sig  . Calcium Carbonate-Vitamin D (OSCAL 500/200 D-3 PO) Take 1 tablet by mouth daily.   . Cholecalciferol (VITAMIN D3) 2000 units TABS Take 2,000 Units  by mouth daily.  . Misc Natural Product [facility] EYE DROPS #2 OP) Place 1 drop into both eyes daily as needed (for blurry eyes/irritation.). Complete Eye Relief  . Multiple Vitamin (MULTIVITAMIN WITH MINERALS) TABS tablet Take 1 tablet by mouth daily with breakfast.  . Nutritional Supplements (ENSURE ACTIVE PO) Take 1 Bottle by mouth.   No facility-administered encounter medications on file as of 11/10/2019.    Review of Systems  Constitutional: Negative for chills, fever, malaise/fatigue and weight loss.  HENT: Negative for congestion, hearing loss and sore throat.   Eyes: Negative for blurred vision.       One eye farsighted and one nearsighted but sees well--follows with Dr. Satira Sark  Respiratory: Negative for cough and shortness of breath.   Cardiovascular: Negative for chest pain, palpitations and leg swelling.  Gastrointestinal: Negative for abdominal pain, blood in stool, constipation, diarrhea, heartburn, melena, nausea and vomiting.  Genitourinary: Negative for dysuria, frequency and urgency.  Musculoskeletal: Negative for falls and joint pain.  Skin: Negative for itching and rash.  Neurological: Negative for dizziness and loss of consciousness.  Endo/Heme/Allergies: Does not bruise/bleed easily.  Psychiatric/Behavioral: Negative for depression and memory loss. The patient is not nervous/anxious and does not have insomnia.     Vitals:   11/10/19 0810  BP: 120/82  Pulse: 95  Temp: (!) 97.5 F (36.4 C)  SpO2: 96%  Weight: 98 lb 3.2 oz (44.5 kg)  Height: 5\' 4"  (1.626 m)   Body mass index is 16.86 kg/m. Physical Exam Vitals reviewed.  Constitutional:      General: She is not in acute distress.    Appearance: Normal appearance. She is not ill-appearing or toxic-appearing.     Comments: Thin spunky 86 yo lady  HENT:     Head: Normocephalic and atraumatic.     Right Ear: Tympanic membrane, ear canal and external ear normal.     Left Ear: Tympanic membrane, ear canal and  external ear normal.     Nose: Nose normal.     Mouth/Throat:     Pharynx: Oropharynx is clear.  Eyes:     Extraocular Movements: Extraocular movements intact.     Conjunctiva/sclera: Conjunctivae normal.     Pupils: Pupils are equal, round, and reactive to light.  Cardiovascular:     Rate and Rhythm: Normal rate and regular rhythm.     Pulses: Normal pulses.     Heart sounds: Normal heart sounds.  Pulmonary:     Effort: Pulmonary effort is normal.     Breath sounds: Normal breath sounds. No wheezing, rhonchi or rales.  Chest:     Comments: fibrocystic breasts--notable right lower inner quadrant and upper half of left breast Abdominal:     General: Bowel sounds are normal. There is no distension.  Palpations: Abdomen is soft. There is no mass.     Tenderness: There is no abdominal tenderness. There is no guarding or rebound.  Musculoskeletal:        General: Normal range of motion.     Cervical back: Neck supple. No rigidity or tenderness.     Right lower leg: No edema.     Left lower leg: No edema.  Lymphadenopathy:     Cervical: No cervical adenopathy.     Upper Body:     Right upper body: No supraclavicular, axillary or pectoral adenopathy.     Left upper body: No supraclavicular, axillary or pectoral adenopathy.  Skin:    General: Skin is warm and dry.     Comments: Several scars on upper back from prior skin cancer surgery; anterior chest has atypical nevus that she reports her dermatologist has been monitoring every 6 mos and is not concerned about  Neurological:     General: No focal deficit present.     Mental Status: She is alert and oriented to person, place, and time.     Cranial Nerves: No cranial nerve deficit.     Sensory: No sensory deficit.     Motor: No weakness.     Coordination: Coordination normal.     Gait: Gait normal.     Deep Tendon Reflexes: Reflexes normal.  Psychiatric:        Mood and Affect: Mood normal.        Behavior: Behavior normal.         Thought Content: Thought content normal.        Judgment: Judgment normal.     Labs reviewed: Basic Metabolic Panel: Recent Labs    10/31/19 0806  NA 140  K 4.8  CL 102  CO2 25  GLUCOSE 96  BUN 14  CREATININE 0.94*  CALCIUM 9.7   Liver Function Tests: Recent Labs    10/31/19 0806  AST 16  ALT 8  BILITOT 0.5  PROT 7.2   No results for input(s): LIPASE, AMYLASE in the last 8760 hours. No results for input(s): AMMONIA in the last 8760 hours. CBC: Recent Labs    10/31/19 0806  WBC 7.0  NEUTROABS 4,942  HGB 16.0*  HCT 48.5*  MCV 95.8  PLT 242   Cardiac Enzymes: No results for input(s): CKTOTAL, CKMB, CKMBINDEX, TROPONINI in the last 8760 hours. BNP: Invalid input(s): POCBNP Lab Results  Component Value Date   HGBA1C 5.1 10/31/2019   Lab Results  Component Value Date   TSH 1.25 10/31/2019    Assessment/Plan 1. Annual physical exam -performed today -no longer doing mammograms, due for eye exam soon, had covid vaccines -brought her HCPOA/living will today to be scanned into vynca  2. Essential hypertension, benign -bp well controlled w/o dizziness, no changes to regimen needed  3. Tachy-brady syndrome ([place]) -reason for pacer, doing fine  4. S/P placement of cardiac pacemaker -gets regular interrogations  5. Hyperglycemia -has been in normal range lately, f/u next year  6. Pure hypercholesterolemia -LDL improved though still above goal, does not want meds, is unsure what she did differently  7. Underweight -counseled on adequate protein in diet -is very active walking several miles 5 days per week  8. Body mass index (BMI) of 19 or less in adult -remains underweight, but has gained weight since last visit  Labs/tests ordered:  1 year labs could not be ordered due to [facility] policy and expiring more than a year from now!  Cbc with diff, cmp, flp, hba1c before next annual exam  1 year f/u for CPE, fasting labs before  Dago Jungwirth L. Lesbia Ottaway,  D.O. [facility] [address], [place] [address] Cell Phone (Mon-Fri 8am-5pm):  [phone] On Call:  [phone] & follow prompts after 5pm & weekends Office Phone:  [phone] Office Fax:  [phone]

## 2019-12-12 ENCOUNTER — Ambulatory Visit (INDEPENDENT_AMBULATORY_CARE_PROVIDER_SITE_OTHER): Payer: Medicare PPO | Admitting: *Deleted

## 2019-12-12 DIAGNOSIS — I495 Sick sinus syndrome: Secondary | ICD-10-CM

## 2019-12-13 LAB — CUP PACEART REMOTE DEVICE CHECK
Battery Impedance: 203 Ohm
Battery Remaining Longevity: 124 mo
Battery Voltage: 2.78 V
Brady Statistic AP VP Percent: 0 %
Brady Statistic AP VS Percent: 3 %
Brady Statistic AS VP Percent: 1 %
Brady Statistic AS VS Percent: 96 %
Date Time Interrogation Session: 20210802112919
Implantable Lead Implant Date: 20080703
Implantable Lead Implant Date: 20080703
Implantable Lead Location: 753859
Implantable Lead Location: 753860
Implantable Lead Model: 5076
Implantable Lead Model: 5076
Implantable Pulse Generator Implant Date: 20181024
Lead Channel Impedance Value: 489 Ohm
Lead Channel Impedance Value: 643 Ohm
Lead Channel Pacing Threshold Amplitude: 0.5 V
Lead Channel Pacing Threshold Amplitude: 0.625 V
Lead Channel Pacing Threshold Pulse Width: 0.4 ms
Lead Channel Pacing Threshold Pulse Width: 0.4 ms
Lead Channel Setting Pacing Amplitude: 1.5 V
Lead Channel Setting Pacing Amplitude: 2.5 V
Lead Channel Setting Pacing Pulse Width: 0.4 ms
Lead Channel Setting Sensing Sensitivity: 5.6 mV

## 2019-12-15 NOTE — Progress Notes (Signed)
Remote pacemaker transmission.   

## 2020-02-02 DIAGNOSIS — D1801 Hemangioma of skin and subcutaneous tissue: Secondary | ICD-10-CM | POA: Diagnosis not present

## 2020-02-02 DIAGNOSIS — D0472 Carcinoma in situ of skin of left lower limb, including hip: Secondary | ICD-10-CM | POA: Diagnosis not present

## 2020-02-02 DIAGNOSIS — L814 Other melanin hyperpigmentation: Secondary | ICD-10-CM | POA: Diagnosis not present

## 2020-02-02 DIAGNOSIS — D044 Carcinoma in situ of skin of scalp and neck: Secondary | ICD-10-CM | POA: Diagnosis not present

## 2020-02-02 DIAGNOSIS — L237 Allergic contact dermatitis due to plants, except food: Secondary | ICD-10-CM | POA: Diagnosis not present

## 2020-02-02 DIAGNOSIS — D2261 Melanocytic nevi of right upper limb, including shoulder: Secondary | ICD-10-CM | POA: Diagnosis not present

## 2020-02-02 DIAGNOSIS — L570 Actinic keratosis: Secondary | ICD-10-CM | POA: Diagnosis not present

## 2020-02-02 DIAGNOSIS — Z85828 Personal history of other malignant neoplasm of skin: Secondary | ICD-10-CM | POA: Diagnosis not present

## 2020-02-02 DIAGNOSIS — L820 Inflamed seborrheic keratosis: Secondary | ICD-10-CM | POA: Diagnosis not present

## 2020-03-07 DIAGNOSIS — Z23 Encounter for immunization: Secondary | ICD-10-CM | POA: Diagnosis not present

## 2020-03-23 ENCOUNTER — Telehealth: Payer: Self-pay | Admitting: Internal Medicine

## 2020-03-23 NOTE — Telephone Encounter (Signed)
Pt called in stated that she did send a transmission on 11/1 at 10:20.  She rec'd a letter stating she missed it.  She would like to know if it was rec'd?     Best number [phone]

## 2020-03-26 ENCOUNTER — Ambulatory Visit (INDEPENDENT_AMBULATORY_CARE_PROVIDER_SITE_OTHER): Payer: Medicare PPO

## 2020-03-26 DIAGNOSIS — R55 Syncope and collapse: Secondary | ICD-10-CM

## 2020-03-26 NOTE — Telephone Encounter (Signed)
Spoke with pt, attempted to assist with manual transmission.  Unable to receive transmission.  Conferenced [employer] tech support on line.  While pt was speaking with them the line went dead.  Pt had provided callback number for them to call her back directly.  Attempted to call patient directly, line was busy.

## 2020-03-26 NOTE — Telephone Encounter (Signed)
Manual transmission received, spoke with pt and establishd new 91 days scheduled effective 06/26/19.

## 2020-03-26 NOTE — Telephone Encounter (Signed)
Patient calling back.   °

## 2020-03-27 LAB — CUP PACEART REMOTE DEVICE CHECK
Battery Impedance: 228 Ohm
Battery Remaining Longevity: 120 mo
Battery Voltage: 2.78 V
Brady Statistic AP VP Percent: 0 %
Brady Statistic AP VS Percent: 3 %
Brady Statistic AS VP Percent: 1 %
Brady Statistic AS VS Percent: 96 %
Date Time Interrogation Session: 20211115102351
Implantable Lead Implant Date: 20080703
Implantable Lead Implant Date: 20080703
Implantable Lead Location: 753859
Implantable Lead Location: 753860
Implantable Lead Model: 5076
Implantable Lead Model: 5076
Implantable Pulse Generator Implant Date: 20181024
Lead Channel Impedance Value: 489 Ohm
Lead Channel Impedance Value: 615 Ohm
Lead Channel Pacing Threshold Amplitude: 0.5 V
Lead Channel Pacing Threshold Amplitude: 0.5 V
Lead Channel Pacing Threshold Pulse Width: 0.4 ms
Lead Channel Pacing Threshold Pulse Width: 0.4 ms
Lead Channel Setting Pacing Amplitude: 1.5 V
Lead Channel Setting Pacing Amplitude: 2.5 V
Lead Channel Setting Pacing Pulse Width: 0.4 ms
Lead Channel Setting Sensing Sensitivity: 5.6 mV

## 2020-03-28 NOTE — Progress Notes (Signed)
Remote pacemaker transmission.   

## 2020-06-25 ENCOUNTER — Ambulatory Visit (INDEPENDENT_AMBULATORY_CARE_PROVIDER_SITE_OTHER): Payer: Medicare PPO

## 2020-06-25 DIAGNOSIS — I495 Sick sinus syndrome: Secondary | ICD-10-CM

## 2020-06-25 LAB — CUP PACEART REMOTE DEVICE CHECK
Battery Impedance: 278 Ohm
Battery Remaining Longevity: 114 mo
Battery Voltage: 2.78 V
Brady Statistic AP VP Percent: 0 %
Brady Statistic AP VS Percent: 3 %
Brady Statistic AS VP Percent: 1 %
Brady Statistic AS VS Percent: 96 %
Date Time Interrogation Session: 20220214095826
Implantable Lead Implant Date: 20080703
Implantable Lead Implant Date: 20080703
Implantable Lead Location: 753859
Implantable Lead Location: 753860
Implantable Lead Model: 5076
Implantable Lead Model: 5076
Implantable Pulse Generator Implant Date: 20181024
Lead Channel Impedance Value: 489 Ohm
Lead Channel Impedance Value: 562 Ohm
Lead Channel Pacing Threshold Amplitude: 0.5 V
Lead Channel Pacing Threshold Amplitude: 0.625 V
Lead Channel Pacing Threshold Pulse Width: 0.4 ms
Lead Channel Pacing Threshold Pulse Width: 0.4 ms
Lead Channel Setting Pacing Amplitude: 1.5 V
Lead Channel Setting Pacing Amplitude: 2.5 V
Lead Channel Setting Pacing Pulse Width: 0.4 ms
Lead Channel Setting Sensing Sensitivity: 5.6 mV

## 2020-07-02 ENCOUNTER — Encounter: Payer: Self-pay | Admitting: Internal Medicine

## 2020-07-02 NOTE — Progress Notes (Signed)
Remote pacemaker transmission.   

## 2020-07-17 ENCOUNTER — Other Ambulatory Visit: Payer: Self-pay | Admitting: Nurse Practitioner

## 2020-07-17 DIAGNOSIS — E7800 Pure hypercholesterolemia, unspecified: Secondary | ICD-10-CM

## 2020-07-17 DIAGNOSIS — R739 Hyperglycemia, unspecified: Secondary | ICD-10-CM

## 2020-07-17 DIAGNOSIS — I10 Essential (primary) hypertension: Secondary | ICD-10-CM

## 2020-09-24 ENCOUNTER — Ambulatory Visit (INDEPENDENT_AMBULATORY_CARE_PROVIDER_SITE_OTHER): Payer: Medicare PPO

## 2020-09-24 DIAGNOSIS — R55 Syncope and collapse: Secondary | ICD-10-CM

## 2020-09-26 LAB — CUP PACEART REMOTE DEVICE CHECK
Battery Impedance: 278 Ohm
Battery Remaining Longevity: 113 mo
Battery Voltage: 2.78 V
Brady Statistic AP VP Percent: 0 %
Brady Statistic AP VS Percent: 3 %
Brady Statistic AS VP Percent: 1 %
Brady Statistic AS VS Percent: 96 %
Date Time Interrogation Session: 20220516093114
Implantable Lead Implant Date: 20080703
Implantable Lead Implant Date: 20080703
Implantable Lead Location: 753859
Implantable Lead Location: 753860
Implantable Lead Model: 5076
Implantable Lead Model: 5076
Implantable Pulse Generator Implant Date: 20181024
Lead Channel Impedance Value: 519 Ohm
Lead Channel Impedance Value: 613 Ohm
Lead Channel Pacing Threshold Amplitude: 0.5 V
Lead Channel Pacing Threshold Amplitude: 0.625 V
Lead Channel Pacing Threshold Pulse Width: 0.4 ms
Lead Channel Pacing Threshold Pulse Width: 0.4 ms
Lead Channel Setting Pacing Amplitude: 1.5 V
Lead Channel Setting Pacing Amplitude: 2.5 V
Lead Channel Setting Pacing Pulse Width: 0.4 ms
Lead Channel Setting Sensing Sensitivity: 5.6 mV

## 2020-10-17 NOTE — Progress Notes (Signed)
Remote pacemaker transmission.   

## 2020-11-06 ENCOUNTER — Encounter: Payer: Self-pay | Admitting: Nurse Practitioner

## 2020-11-06 ENCOUNTER — Ambulatory Visit (INDEPENDENT_AMBULATORY_CARE_PROVIDER_SITE_OTHER): Payer: Medicare PPO | Admitting: Nurse Practitioner

## 2020-11-06 ENCOUNTER — Other Ambulatory Visit: Payer: Self-pay

## 2020-11-06 ENCOUNTER — Telehealth: Payer: Self-pay

## 2020-11-06 DIAGNOSIS — Z0000 Encounter for general adult medical examination without abnormal findings: Secondary | ICD-10-CM

## 2020-11-06 NOTE — Telephone Encounter (Signed)
[name], Alexandria Wright are scheduled for a virtual visit with your provider today.    Just as we do with appointments in the office, we must obtain your consent to participate.  Your consent will be active for this visit and any virtual visit you may have with one of our providers in the next 365 days.    If you have a MyChart account, I can also send a copy of this consent to you electronically.  All virtual visits are billed to your insurance company just like a traditional visit in the office.  As this is a virtual visit, video technology does not allow for your provider to perform a traditional examination.  This may limit your provider's ability to fully assess your condition.  If your provider identifies any concerns that need to be evaluated in person or the need to arrange testing such as labs, EKG, etc, we will make arrangements to do so.    Although advances in technology are sophisticated, we cannot ensure that it will always work on either your end or our end.  If the connection with a video visit is poor, we may have to switch to a telephone visit.  With either a video or telephone visit, we are not always able to ensure that we have a secure connection.   I need to obtain your verbal consent now.   Are you willing to proceed with your visit today?   Alexandria Wright has provided verbal consent on 11/06/2020 for a virtual visit (video or telephone).   Carroll Kinds, [facility] 11/06/2020  8:31 AM

## 2020-11-06 NOTE — Patient Instructions (Signed)
Alexandria Wright , Thank you for taking time to come for your Medicare Wellness Visit. I appreciate your ongoing commitment to your health goals. Please review the following plan we discussed and let me know if I can assist you in the future.   Screening recommendations/referrals: Colonoscopy aged out Mammogram aged out Bone Density RECOMMENDED  Recommended yearly ophthalmology/optometry visit for glaucoma screening and checkup Recommended yearly dental visit for hygiene and checkup  Vaccinations: Influenza vaccine up to date Pneumococcal vaccine up to date Tdap vaccine up to date Shingles vaccine up to date    Advanced directives: on file.   Conditions/risks identified: fracture due to osteoporosis- recommend follow up bone density and treatment  Next appointment: 1 year for awv    Preventive Care 65 Years and Older, Female Preventive care refers to lifestyle choices and visits with your health care provider that can promote health and wellness. What does preventive care include? A yearly physical exam. This is also called an annual well check. Dental exams once or twice a year. Routine eye exams. Ask your health care provider how often you should have your eyes checked. Personal lifestyle choices, including: Daily care of your teeth and gums. Regular physical activity. Eating a healthy diet. Avoiding tobacco and drug use. Limiting alcohol use. Practicing safe sex. Taking low-dose aspirin every day. Taking vitamin and mineral supplements as recommended by your health care provider. What happens during an annual well check? The services and screenings done by your health care provider during your annual well check will depend on your age, overall health, lifestyle risk factors, and family history of disease. Counseling  Your health care provider may ask you questions about your: Alcohol use. Tobacco use. Drug use. Emotional well-being. Home and relationship  well-being. Sexual activity. Eating habits. History of falls. Memory and ability to understand (cognition). Work and work Statistician. Reproductive health. Screening  You may have the following tests or measurements: Height, weight, and BMI. Blood pressure. Lipid and cholesterol levels. These may be checked every 5 years, or more frequently if you are over 59 years old. Skin check. Lung cancer screening. You may have this screening every year starting at age 25 if you have a 30-pack-year history of smoking and currently smoke or have quit within the past 15 years. Fecal occult blood test (FOBT) of the stool. You may have this test every year starting at age 75. Flexible sigmoidoscopy or colonoscopy. You may have a sigmoidoscopy every 5 years or a colonoscopy every 10 years starting at age 14. Hepatitis C blood test. Hepatitis B blood test. Sexually transmitted disease (STD) testing. Diabetes screening. This is done by checking your blood sugar (glucose) after you have not eaten for a while (fasting). You may have this done every 1-3 years. Bone density scan. This is done to screen for osteoporosis. You may have this done starting at age 11. Mammogram. This may be done every 1-2 years. Talk to your health care provider about how often you should have regular mammograms. Talk with your health care provider about your test results, treatment options, and if necessary, the need for more tests. Vaccines  Your health care provider may recommend certain vaccines, such as: Influenza vaccine. This is recommended every year. Tetanus, diphtheria, and acellular pertussis (Tdap, Td) vaccine. You may need a Td booster every 10 years. Zoster vaccine. You may need this after age 17. Pneumococcal 13-valent conjugate (PCV13) vaccine. One dose is recommended after age 74. Pneumococcal polysaccharide (PPSV23) vaccine. One dose is  recommended after age 77. Talk to your health care provider about which  screenings and vaccines you need and how often you need them. This information is not intended to replace advice given to you by your health care provider. Make sure you discuss any questions you have with your health care provider. Document Released: 05/25/2015 Document Revised: 01/16/2016 Document Reviewed: 02/27/2015 Elsevier Interactive Patient Education  2017 [place] Prevention in the Home Falls can cause injuries. They can happen to people of all ages. There are many things you can do to make your home safe and to help prevent falls. What can I do on the outside of my home? Regularly fix the edges of walkways and driveways and fix any cracks. Remove anything that might make you trip as you walk through a door, such as a raised step or threshold. Trim any bushes or trees on the path to your home. Use bright outdoor lighting. Clear any walking paths of anything that might make someone trip, such as rocks or tools. Regularly check to see if handrails are loose or broken. Make sure that both sides of any steps have handrails. Any raised decks and porches should have guardrails on the edges. Have any leaves, snow, or ice cleared regularly. Use sand or salt on walking paths during winter. Clean up any spills in your garage right away. This includes oil or grease spills. What can I do in the bathroom? Use night lights. Install grab bars by the toilet and in the tub and shower. Do not use towel bars as grab bars. Use non-skid mats or decals in the tub or shower. If you need to sit down in the shower, use a plastic, non-slip stool. Keep the floor dry. Clean up any water that spills on the floor as soon as it happens. Remove soap buildup in the tub or shower regularly. Attach bath mats securely with double-sided non-slip rug tape. Do not have throw rugs and other things on the floor that can make you trip. What can I do in the bedroom? Use night lights. Make sure that you have a  light by your bed that is easy to reach. Do not use any sheets or blankets that are too big for your bed. They should not hang down onto the floor. Have a firm chair that has side arms. You can use this for support while you get dressed. Do not have throw rugs and other things on the floor that can make you trip. What can I do in the kitchen? Clean up any spills right away. Avoid walking on wet floors. Keep items that you use a lot in easy-to-reach places. If you need to reach something above you, use a strong step stool that has a grab bar. Keep electrical cords out of the way. Do not use floor polish or wax that makes floors slippery. If you must use wax, use non-skid floor wax. Do not have throw rugs and other things on the floor that can make you trip. What can I do with my stairs? Do not leave any items on the stairs. Make sure that there are handrails on both sides of the stairs and use them. Fix handrails that are broken or loose. Make sure that handrails are as long as the stairways. Check any carpeting to make sure that it is firmly attached to the stairs. Fix any carpet that is loose or worn. Avoid having throw rugs at the top or bottom of the stairs. If  you do have throw rugs, attach them to the floor with carpet tape. Make sure that you have a light switch at the top of the stairs and the bottom of the stairs. If you do not have them, ask someone to add them for you. What else can I do to help prevent falls? Wear shoes that: Do not have high heels. Have rubber bottoms. Are comfortable and fit you well. Are closed at the toe. Do not wear sandals. If you use a stepladder: Make sure that it is fully opened. Do not climb a closed stepladder. Make sure that both sides of the stepladder are locked into place. Ask someone to hold it for you, if possible. Clearly mark and make sure that you can see: Any grab bars or handrails. First and last steps. Where the edge of each step  is. Use tools that help you move around (mobility aids) if they are needed. These include: Canes. Walkers. Scooters. Crutches. Turn on the lights when you go into a dark area. Replace any light bulbs as soon as they burn out. Set up your furniture so you have a clear path. Avoid moving your furniture around. If any of your floors are uneven, fix them. If there are any pets around you, be aware of where they are. Review your medicines with your doctor. Some medicines can make you feel dizzy. This can increase your chance of falling. Ask your doctor what other things that you can do to help prevent falls. This information is not intended to replace advice given to you by your health care provider. Make sure you discuss any questions you have with your health care provider. Document Released: 02/22/2009 Document Revised: 10/04/2015 Document Reviewed: 06/02/2014 Elsevier Interactive Patient Education  2017 [employer].

## 2020-11-06 NOTE — Progress Notes (Signed)
This service is provided via telemedicine  No vital signs collected/recorded due to the encounter was a telemedicine visit.   Location of patient (ex: home, work):  Home  Patient consents to a telephone visit:  Yes, see encounter dated 11/06/2020  Location of the provider (ex: office, home):  [place]  Name of any referring provider:  N/A  Names of all persons participating in the telemedicine service and their role in the encounter:  Sherrie Mustache, Nurse Practitioner, Carroll Kinds, CMA, and patient.   Time spent on call:  12 minutes with medical assistant

## 2020-11-06 NOTE — Progress Notes (Signed)
Subjective:   Alexandria Wright is a 87 y.o. female who presents for Medicare Annual (Subsequent) preventive examination.  Review of Systems           Objective:    There were no vitals filed for this visit. There is no height or weight on file to calculate BMI.  Advanced Directives 11/06/2020 11/10/2019 11/03/2019 11/02/2018 09/21/2017 03/04/2017 09/05/2016  Does Patient Have a Medical Advance Directive? Yes Yes Yes No Yes Yes Yes  Type of Paramedic of [place];Living will [place];Living will;Out of facility DNR (pink MOST or yellow form) Living will;Out of facility DNR (pink MOST or yellow form) - Out of facility DNR (pink MOST or yellow form) [place];Living will [place];Living will;Out of facility DNR (pink MOST or yellow form)  Does patient want to make changes to medical advance directive? No - Patient declined No - Guardian declined No - Patient declined - No - Patient declined No - Patient declined -  Copy of [place] in Chart? Yes - validated most recent copy scanned in chart (See row information) Yes - validated most recent copy scanned in chart (See row information) - - - No - copy requested Yes  Would patient like information on creating a medical advance directive? - - - No - Patient declined - - -  Pre-existing out of facility DNR order (yellow form or pink MOST form) - Pink MOST/Yellow Form most recent copy in chart - Physician notified to receive inpatient order Yellow form placed in chart (order not valid for inpatient use) - Pink MOST form placed in chart (order not valid for inpatient use);Yellow form placed in chart (order not valid for inpatient use) - Yellow form placed in chart (order not valid for inpatient use);Pink MOST form placed in chart (order not valid for inpatient use)    Current Medications (verified) Outpatient Encounter Medications as of 11/06/2020   Medication Sig   Calcium Carbonate-Vitamin D (OSCAL 500/200 D-3 PO) Take 1 tablet by mouth daily.    Cholecalciferol (VITAMIN D3) 2000 units TABS Take 2,000 Units by mouth daily.   Misc Natural Product [facility] EYE DROPS #2 OP) Place 1 drop into both eyes daily as needed (for blurry eyes/irritation.). Complete Eye Relief   Multiple Vitamin (MULTIVITAMIN WITH MINERALS) TABS tablet Take 1 tablet by mouth daily with breakfast.   Nutritional Supplements (ENSURE ACTIVE PO) Take 1 Bottle by mouth.   No facility-administered encounter medications on file as of 11/06/2020.    Allergies (verified) Sulfonamide derivatives and Penicillins   History: Past Medical History:  Diagnosis Date   Bradycardia    a. s/p MDT dual chamber PPM    Esophageal stricture    GERD (gastroesophageal reflux disease)    Skin cancer    Syncope    Tachycardia    asyptomatic nonsustained   Past Surgical History:  Procedure Laterality Date   CATARACT EXTRACTION  12/10/2011   right    CATARACT EXTRACTION  12/17/2011   left   PACEMAKER PLACEMENT  2008   MDT dual chamber PPM implanted by Dr Caryl Comes for symptomatic bradycardia   PPM GENERATOR CHANGEOUT N/A 03/04/2017   Procedure: PPM GENERATOR CHANGEOUT;  Surgeon: Deboraha Sprang, MD;  Location: [facility];  Service: Cardiovascular;  Laterality: N/A;   SKIN CANCER EXCISION     TONSILLECTOMY AND ADENOIDECTOMY     Family History  Problem Relation Age of Onset   Heart  disease Mother    Colon cancer Neg Hx    Social History   Socioeconomic History   Marital status: Widowed    Spouse name: Not on file   Number of children: 0   Years of education: Not on file   Highest education level: Not on file  Occupational History   Occupation: retired Pharmacist, hospital  Tobacco Use   Smoking status: Never   Smokeless tobacco: Never  Substance and Sexual Activity   Alcohol use: No   Drug use: No   Sexual activity: Not on file  Other Topics Concern   Not on file   Social History Narrative   Widow -husband died 08/21/2003   Lives alone   Never smoked   Alcohol none   Walks 8 miles daily 5 days a week    Living Will   Social Determinants of Health   Financial Resource Strain: Not on file  Food Insecurity: Not on file  Transportation Needs: Not on file  Physical Activity: Not on file  Stress: Not on file  Social Connections: Not on file    Tobacco Counseling Counseling given: Not Answered   Clinical Intake:                 Diabetic?no         Activities of Daily Living No flowsheet data found.  Patient Care Team: Lauree Chandler, NP as PCP - General (Geriatric Medicine) Marygrace Drought, MD as Consulting Physician (Ophthalmology)  Indicate any recent Medical Services you may have received from other than [facility] providers in the past year (date may be approximate).     Assessment:   This is a routine wellness examination for Alexandria Wright.  Hearing/Vision screen Hearing Screening - Comments:: Patient has no hearing problems. Vision Screening - Comments:: Patient has no vision problems. Patient is scheduled for eye exam for the year. Patient sees Dr. Satira Sark.  Dietary issues and exercise activities discussed:     Goals Addressed   None    Depression Screen PHQ 2/9 Scores 11/06/2020 11/10/2019 11/03/2019 11/02/2018 11/02/2018 11/01/2018 09/21/2017  PHQ - 2 Score 0 0 0 0 0 0 0    Fall Risk Fall Risk  11/06/2020 11/10/2019 11/03/2019 11/02/2018 11/02/2018  Falls in the past year? 0 0 0 0 0  Number falls in past yr: 0 0 0 0 0  Injury with Fall? 0 0 0 0 0    FALL RISK PREVENTION PERTAINING TO THE HOME:  Any stairs in or around the home? Yes  If so, are there any without handrails? Yes  Home free of loose throw rugs in walkways, pet beds, electrical cords, etc? Yes  Adequate lighting in your home to reduce risk of falls? Yes   ASSISTIVE DEVICES UTILIZED TO PREVENT FALLS:  Life alert? No  Use of a cane, walker or w/c? No  Grab  bars in the bathroom? Yes  Shower chair or bench in shower? Yes  Elevated toilet seat or a handicapped toilet? Yes   TIMED UP AND GO:  Was the test performed? No .  L  Cognitive Function: MMSE - Mini Mental State Exam 11/02/2018 09/21/2017 09/01/2016 08/31/2015 08/25/2014  Orientation to time 5 5 5 5 5   Orientation to Place 5 5 5 5 5   Registration 3 3 3 3 3   Attention/ Calculation 5 5 5 5 5   Recall 3 2 3 3 2   Language- name 2 objects 2 2 2 2 2   Language- repeat 1 1 1 1  1  Language- follow 3 step command 3 3 3 3 3   Language- read & follow direction 1 1 1 1 1   Write a sentence 1 1 1 1 1   Copy design 1 1 1 1 1   Total score 30 29 30 30 29      6CIT Screen 11/06/2020 11/03/2019  What Year? 0 points 0 points  What month? 0 points 0 points  What time? 0 points 0 points  Count back from 20 0 points 0 points  Months in reverse 2 points 0 points  Repeat phrase 0 points -  Total Score 2 -    Immunizations Immunization History  Administered Date(s) Administered   Influenza, High Dose Seasonal PF 01/29/2017, 02/04/2018, 02/24/2019, 03/07/2020   Influenza-Unspecified 01/25/2013, 02/05/2016   PFIZER(Purple Top)SARS-COV-2 Vaccination 06/16/2019, 07/13/2019, 02/13/2020, 09/03/2020   Pneumococcal Conjugate-13 08/25/2014, 02/05/2016   Pneumococcal Polysaccharide-23 08/26/2011   Td 01/05/2020   Tdap 07/11/2011, 01/05/2020   Zoster Recombinat (Shingrix) 12/04/2016   Zoster, Live 08/23/2013, 09/04/2016    TDAP status: Up to date  Flu Vaccine status: Up to date  Pneumococcal vaccine status: Up to date  Covid-19 vaccine status: Completed vaccines  Qualifies for Shingles Vaccine? Yes   Zostavax completed Yes   Shingrix Completed?: Yes  Screening Tests Health Maintenance  Topic Date Due   Zoster Vaccines- Shingrix (2 of 2) 01/29/2017   INFLUENZA VACCINE  12/10/2020   TETANUS/TDAP  01/04/2030   DEXA SCAN  Completed   COVID-19 Vaccine  Completed   PNA vac Low Risk Adult  Completed    HPV VACCINES  Aged Out    Health Maintenance  Health Maintenance Due  Topic Date Due   Zoster Vaccines- Shingrix (2 of 2) 01/29/2017    Colorectal cancer screening: No longer required.   Mammogram status: No longer required due to age.  Decline bone density   Lung Cancer Screening: (Low Dose CT Chest recommended if Age 59-80 years, 30 pack-year currently smoking OR have quit w/in 15years.) does not qualify.   Lung Cancer Screening Referral: na  Additional Screening:  Hepatitis C Screening: does not qualify; Completed na  Vision Screening: Recommended annual ophthalmology exams for early detection of glaucoma and other disorders of the eye. Is the patient up to date with their annual eye exam?  No  Who is the provider or what is the name of the office in which the patient attends annual eye exams? Dr Satira Sark If pt is not established with a provider, would they like to be referred to a provider to establish care? No .   Dental Screening: Recommended annual dental exams for proper oral hygiene  Community Resource Referral / Chronic Care Management: CRR required this visit?  No   CCM required this visit?  No      Plan:     I have personally reviewed and noted the following in the patient's chart:   Medical and social history Use of alcohol, tobacco or illicit drugs  Current medications and supplements including opioid prescriptions.  Functional ability and status Nutritional status Physical activity Advanced directives List of other physicians Hospitalizations, surgeries, and ER visits in previous 12 months Vitals Screenings to include cognitive, depression, and falls Referrals and appointments  In addition, I have reviewed and discussed with patient certain preventive protocols, quality metrics, and best practice recommendations. A written personalized care plan for preventive services as well as general preventive health recommendations were provided to patient.      Lauree Chandler, NP   11/06/2020  Virtual Visit via Telephone Note  I connected withNAME@ on 11/06/20 at  8:30 AM EDT by telephone and verified that I am speaking with the correct person using two identifiers.  Location: Patient: home Provider: [place] [facility].   I discussed the limitations, risks, security and privacy concerns of performing an evaluation and management service by telephone and the availability of in person appointments. I also discussed with the patient that there may be a patient responsible charge related to this service. The patient expressed understanding and agreed to proceed.   I discussed the assessment and treatment plan with the patient. The patient was provided an opportunity to ask questions and all were answered. The patient agreed with the plan and demonstrated an understanding of the instructions.   The patient was advised to call back or seek an in-person evaluation if the symptoms worsen or if the condition fails to improve as anticipated.  I provided 16 minutes of non-face-to-face time during this encounter.  [employer]os [employer]. Harle Battiest Av[employer] printed and mailed

## 2020-11-13 ENCOUNTER — Other Ambulatory Visit: Payer: Medicare PPO

## 2020-11-13 ENCOUNTER — Other Ambulatory Visit: Payer: Self-pay

## 2020-11-13 DIAGNOSIS — I10 Essential (primary) hypertension: Secondary | ICD-10-CM

## 2020-11-13 DIAGNOSIS — E7800 Pure hypercholesterolemia, unspecified: Secondary | ICD-10-CM

## 2020-11-13 DIAGNOSIS — R739 Hyperglycemia, unspecified: Secondary | ICD-10-CM

## 2020-11-14 LAB — COMPLETE METABOLIC PANEL WITH GFR
AG Ratio: 1.5 (calc) (ref 1.0–2.5)
ALT: 10 U/L (ref 6–29)
AST: 16 U/L (ref 10–35)
Albumin: 4.3 g/dL (ref 3.6–5.1)
Alkaline phosphatase (APISO): 65 U/L (ref 37–153)
BUN: 16 mg/dL (ref 7–25)
CO2: 26 mmol/L (ref 20–32)
Calcium: 9.8 mg/dL (ref 8.6–10.4)
Chloride: 98 mmol/L (ref 98–110)
Creat: 0.88 mg/dL (ref 0.60–0.88)
GFR, Est African American: 68 mL/min/{1.73_m2} (ref >=60)
GFR, Est Non African American: 59 mL/min/{1.73_m2} — ABNORMAL LOW (ref >=60)
Globulin: 2.8 g/dL (calc) (ref 1.9–3.7)
Glucose, Bld: 71 mg/dL (ref 65–99)
Potassium: 4.4 mmol/L (ref 3.5–5.3)
Sodium: 139 mmol/L (ref 135–146)
Total Bilirubin: 0.6 mg/dL (ref 0.2–1.2)
Total Protein: 7.1 g/dL (ref 6.1–8.1)

## 2020-11-14 LAB — CBC WITH DIFFERENTIAL/PLATELET
Absolute Monocytes: 772 cells/uL (ref 200–950)
Basophils Absolute: 58 cells/uL (ref 0–200)
Basophils Relative: 0.7 %
Eosinophils Absolute: 91 cells/uL (ref 15–500)
Eosinophils Relative: 1.1 %
HCT: 46.8 % — ABNORMAL HIGH (ref 35.0–45.0)
Hemoglobin: 15.5 g/dL (ref 11.7–15.5)
Lymphs Abs: 938 cells/uL (ref 850–3900)
MCH: 32.2 pg (ref 27.0–33.0)
MCHC: 33.1 g/dL (ref 32.0–36.0)
MCV: 97.1 fL (ref 80.0–100.0)
MPV: 10 fL (ref 7.5–12.5)
Monocytes Relative: 9.3 %
Neutro Abs: 6441 cells/uL (ref 1500–7800)
Neutrophils Relative %: 77.6 %
Platelets: 281 10*3/uL (ref 140–400)
RBC: 4.82 10*6/uL (ref 3.80–5.10)
RDW: 12.3 % (ref 11.0–15.0)
Total Lymphocyte: 11.3 %
WBC: 8.3 10*3/uL (ref 3.8–10.8)

## 2020-11-14 LAB — LIPID PANEL
Cholesterol: 222 mg/dL — ABNORMAL HIGH (ref <200)
HDL: 55 mg/dL (ref >=50)
LDL Cholesterol (Calc): 141 mg/dL (calc) — ABNORMAL HIGH
Non-HDL Cholesterol (Calc): 167 mg/dL (calc) — ABNORMAL HIGH (ref <130)
Total CHOL/HDL Ratio: 4 (calc) (ref <5.0)
Triglycerides: 131 mg/dL (ref <150)

## 2020-11-14 LAB — HEMOGLOBIN A1C
Hgb A1c MFr Bld: 5.2 % of total Hgb (ref <5.7)
Mean Plasma Glucose: 103 mg/dL
eAG (mmol/L): 5.7 mmol/L

## 2020-11-15 ENCOUNTER — Ambulatory Visit: Payer: Medicare PPO | Admitting: Internal Medicine

## 2020-11-16 ENCOUNTER — Ambulatory Visit: Payer: Medicare PPO | Admitting: Nurse Practitioner

## 2020-11-19 ENCOUNTER — Other Ambulatory Visit: Payer: Self-pay

## 2020-11-19 ENCOUNTER — Encounter: Payer: Self-pay | Admitting: Nurse Practitioner

## 2020-11-19 ENCOUNTER — Ambulatory Visit: Payer: Medicare PPO | Admitting: Nurse Practitioner

## 2020-11-19 VITALS — BP 140/80 | HR 97 | Temp 97.7°F | Ht 64.0 in | Wt 94.4 lb

## 2020-11-19 DIAGNOSIS — E7800 Pure hypercholesterolemia, unspecified: Secondary | ICD-10-CM | POA: Diagnosis not present

## 2020-11-19 DIAGNOSIS — I495 Sick sinus syndrome: Secondary | ICD-10-CM

## 2020-11-19 DIAGNOSIS — R739 Hyperglycemia, unspecified: Secondary | ICD-10-CM | POA: Diagnosis not present

## 2020-11-19 DIAGNOSIS — H6123 Impacted cerumen, bilateral: Secondary | ICD-10-CM

## 2020-11-19 DIAGNOSIS — Z66 Do not resuscitate: Secondary | ICD-10-CM

## 2020-11-19 DIAGNOSIS — R636 Underweight: Secondary | ICD-10-CM

## 2020-11-19 DIAGNOSIS — I10 Essential (primary) hypertension: Secondary | ICD-10-CM | POA: Diagnosis not present

## 2020-11-19 DIAGNOSIS — M810 Age-related osteoporosis without current pathological fracture: Secondary | ICD-10-CM

## 2020-11-19 DIAGNOSIS — Z950 Presence of cardiac pacemaker: Secondary | ICD-10-CM

## 2020-11-19 NOTE — Progress Notes (Signed)
Careteam: Patient Care Team: Lauree Chandler, NP as PCP - General (Geriatric Medicine) Marygrace Drought, MD as Consulting Physician (Ophthalmology)  PLACE OF SERVICE:  [place] Directive information Does Patient Have a Medical Advance Directive?: Yes, Type of Advance Directive: [place];Living will, Does patient want to make changes to medical advance directive?: No - Patient declined  Allergies  Allergen Reactions   Sulfonamide Derivatives Other (See Comments)    Made very sick, per pt    Penicillins Swelling and Rash    Has patient had a PCN reaction causing immediate rash, facial/tongue/throat swelling, SOB or lightheadedness with hypotension: Yes Has patient had a PCN reaction causing severe rash involving mucus membranes or skin necrosis: Yes Has patient had a PCN reaction that required hospitalization: No Has patient had a PCN reaction occurring within the last 10 years: No If all of the above answers are "NO", then may proceed with Cephalosporin use.     Chief Complaint  Patient presents with   Annual Exam   Health Maintenance    Zoster vaccine     HPI: Patient is a 87 y.o. female for yearly follow up.  Had a great experience in the lab.   She is up to date on her AWV Declines bone density screening she does have a hx of osteoporosis and on calcium with vit d. Walks routinely.  Hx of bradycardia s/p pacemaker- follows with cardiology for pacer checks routinely  Expresses she wants to be DNR. Needs signed form.   Hypertension- has never seen blood pressure high, question if it is due to being more nervous during this visit.   She does all her house work, washes her care.   Hyperlipidemia- LDL remains elevated, stable from previous, she does not wish to be on any cholesterol related medication   Review of Systems:  Review of Systems  Constitutional:  Negative for chills, fever and weight loss.  HENT:  Negative for  tinnitus.   Respiratory:  Negative for cough, sputum production and shortness of breath.   Cardiovascular:  Negative for chest pain, palpitations and leg swelling.  Gastrointestinal:  Negative for abdominal pain, constipation, diarrhea and heartburn.  Genitourinary:  Negative for dysuria, frequency and urgency.  Musculoskeletal:  Negative for back pain, falls, joint pain and myalgias.  Skin: Negative.   Neurological:  Negative for dizziness and headaches.  Psychiatric/Behavioral:  Negative for depression and memory loss. The patient does not have insomnia.    Past Medical History:  Diagnosis Date   Bradycardia    a. s/p MDT dual chamber PPM    Esophageal stricture    GERD (gastroesophageal reflux disease)    Skin cancer    Syncope    Tachycardia    asyptomatic nonsustained   Past Surgical History:  Procedure Laterality Date   CATARACT EXTRACTION  12/10/2011   right    CATARACT EXTRACTION  12/17/2011   left   PACEMAKER PLACEMENT  2008   MDT dual chamber PPM implanted by Dr Caryl Comes for symptomatic bradycardia   PPM GENERATOR CHANGEOUT N/A 03/04/2017   Procedure: PPM GENERATOR CHANGEOUT;  Surgeon: Deboraha Sprang, MD;  Location: [facility];  Service: Cardiovascular;  Laterality: N/A;   SKIN CANCER EXCISION     TONSILLECTOMY AND ADENOIDECTOMY     Social History:   reports that she has never smoked. She has never used smokeless tobacco. She reports that she does not drink alcohol and does not use drugs.  Family History  Problem Relation Age of Onset   Heart disease Mother    Colon cancer Neg Hx     Medications: Patient's Medications  New Prescriptions   No medications on file  Previous Medications   CALCIUM CARBONATE-VITAMIN D (OSCAL 500/200 D-3 PO)    Take 1 tablet by mouth daily.    CHOLECALCIFEROL (VITAMIN D3) 2000 UNITS TABS    Take 2,000 Units by mouth daily.   MISC NATURAL PRODUCT (SIMILASAN EYE DROPS #2 OP)    Place 1 drop into both eyes daily as needed (for  blurry eyes/irritation.). Complete Eye Relief   MULTIPLE VITAMIN (MULTIVITAMIN WITH MINERALS) TABS TABLET    Take 1 tablet by mouth daily with breakfast.   NUTRITIONAL SUPPLEMENTS (ENSURE ACTIVE PO)    Take 1 Bottle by mouth.  Modified Medications   No medications on file  Discontinued Medications   No medications on file    Physical Exam:  Vitals:   11/19/20 1053  BP: 140/80  Pulse: 97  Temp: 97.7 F (36.5 C)  TempSrc: Temporal  SpO2: 93%  Weight: 94 lb 6.4 oz (42.8 kg)  Height: 5\' 4"  (1.626 m)   Body mass index is 16.2 kg/m. Wt Readings from Last 3 Encounters:  11/19/20 94 lb 6.4 oz (42.8 kg)  11/10/19 98 lb 3.2 oz (44.5 kg)  11/02/18 94 lb (42.6 kg)    Physical Exam Constitutional:      General: She is not in acute distress.    Appearance: She is well-developed. She is not diaphoretic.  HENT:     Head: Normocephalic and atraumatic.     Right Ear: There is impacted cerumen.     Left Ear: There is impacted cerumen.     Mouth/Throat:     Pharynx: No oropharyngeal exudate.  Eyes:     Conjunctiva/sclera: Conjunctivae normal.     Pupils: Pupils are equal, round, and reactive to light.  Cardiovascular:     Rate and Rhythm: Normal rate and regular rhythm.     Heart sounds: Normal heart sounds.  Pulmonary:     Effort: Pulmonary effort is normal.     Breath sounds: Normal breath sounds.  Abdominal:     General: Bowel sounds are normal.     Palpations: Abdomen is soft.  Musculoskeletal:     Cervical back: Normal range of motion and neck supple.     Right lower leg: No edema.     Left lower leg: No edema.  Skin:    General: Skin is warm and dry.  Neurological:     Mental Status: She is alert.  Psychiatric:        Mood and Affect: Mood normal.    Labs reviewed: Basic Metabolic Panel: Recent Labs    11/13/20 0806  NA 139  K 4.4  CL 98  CO2 26  GLUCOSE 71  BUN 16  CREATININE 0.88  CALCIUM 9.8   Liver Function Tests: Recent Labs    11/13/20 0806   AST 16  ALT 10  BILITOT 0.6  PROT 7.1   No results for input(s): LIPASE, AMYLASE in the last 8760 hours. No results for input(s): AMMONIA in the last 8760 hours. CBC: Recent Labs    11/13/20 0806  WBC 8.3  NEUTROABS 6,441  HGB 15.5  HCT 46.8*  MCV 97.1  PLT 281   Lipid Panel: Recent Labs    11/13/20 0806  CHOL 222*  HDL 55  LDLCALC 141*  TRIG 131  CHOLHDL 4.0  TSH: No results for input(s): TSH in the last 8760 hours. A1C: Lab Results  Component Value Date   HGBA1C 5.2 11/13/2020     Assessment/Plan 1. Essential hypertension, benign - elevated today during OV, she plans to recheck at home, suspects some "white coat hypertension" goal <140/90  2. Hyperglycemia -A1c at goal.   3. Pure hypercholesterolemia -LDL above goal but stable. She does not wish to take medication.   4. Tachy-brady syndrome ([place]) S/p pacer  5. S/P placement of cardiac pacemaker Continues to be checked routinely by cardiology.  6. Underweight -continues to eat 3 meals a day. Good energy. Continues to be active and exercise.  7. DNR (do not resuscitate) - DNR (Do Not Resuscitate)  8. Senile osteoporosis -does not wish to follow up bone density at this time. Continues to walk with cal and vit d  9. CERUMEN IMPACTION, BILATERALLY Ear lavage completed by CMA, grabbers used to remove wax. Pt tolerated well.     Next appt: yearly, labs prior  Taesha Goodell K. Turney, [facility] [phone]

## 2020-12-24 ENCOUNTER — Ambulatory Visit (INDEPENDENT_AMBULATORY_CARE_PROVIDER_SITE_OTHER): Payer: Medicare PPO

## 2020-12-24 DIAGNOSIS — I495 Sick sinus syndrome: Secondary | ICD-10-CM

## 2020-12-25 LAB — CUP PACEART REMOTE DEVICE CHECK
Battery Impedance: 328 Ohm
Battery Remaining Longevity: 109 mo
Battery Voltage: 2.78 V
Brady Statistic AP VP Percent: 0 %
Brady Statistic AP VS Percent: 3 %
Brady Statistic AS VP Percent: 1 %
Brady Statistic AS VS Percent: 96 %
Date Time Interrogation Session: 20220815120621
Implantable Lead Implant Date: 20080703
Implantable Lead Implant Date: 20080703
Implantable Lead Location: 753859
Implantable Lead Location: 753860
Implantable Lead Model: 5076
Implantable Lead Model: 5076
Implantable Pulse Generator Implant Date: 20181024
Lead Channel Impedance Value: 495 Ohm
Lead Channel Impedance Value: 580 Ohm
Lead Channel Pacing Threshold Amplitude: 0.25 V
Lead Channel Pacing Threshold Amplitude: 0.625 V
Lead Channel Pacing Threshold Pulse Width: 0.4 ms
Lead Channel Pacing Threshold Pulse Width: 0.4 ms
Lead Channel Setting Pacing Amplitude: 1.5 V
Lead Channel Setting Pacing Amplitude: 2.5 V
Lead Channel Setting Pacing Pulse Width: 0.76 ms
Lead Channel Setting Sensing Sensitivity: 5.6 mV

## 2021-01-12 NOTE — Progress Notes (Signed)
Remote pacemaker transmission.   

## 2021-03-06 ENCOUNTER — Other Ambulatory Visit: Payer: Self-pay

## 2021-03-06 ENCOUNTER — Ambulatory Visit: Payer: Medicare PPO | Admitting: Internal Medicine

## 2021-03-06 ENCOUNTER — Encounter: Payer: Self-pay | Admitting: Internal Medicine

## 2021-03-06 VITALS — BP 124/80 | HR 102 | Ht 64.0 in | Wt 95.8 lb

## 2021-03-06 DIAGNOSIS — I495 Sick sinus syndrome: Secondary | ICD-10-CM | POA: Diagnosis not present

## 2021-03-06 DIAGNOSIS — Z950 Presence of cardiac pacemaker: Secondary | ICD-10-CM | POA: Diagnosis not present

## 2021-03-06 LAB — CUP PACEART INCLINIC DEVICE CHECK
Battery Impedance: 329 Ohm
Battery Remaining Longevity: 108 mo
Battery Voltage: 2.78 V
Brady Statistic AP VP Percent: 0 %
Brady Statistic AP VS Percent: 3 %
Brady Statistic AS VP Percent: 1 %
Brady Statistic AS VS Percent: 96 %
Date Time Interrogation Session: 20221026171546
Implantable Lead Implant Date: 20080703
Implantable Lead Implant Date: 20080703
Implantable Lead Location: 753859
Implantable Lead Location: 753860
Implantable Lead Model: 5076
Implantable Lead Model: 5076
Implantable Pulse Generator Implant Date: 20181024
Lead Channel Impedance Value: 496 Ohm
Lead Channel Impedance Value: 559 Ohm
Lead Channel Pacing Threshold Amplitude: 0.5 V
Lead Channel Pacing Threshold Amplitude: 0.5 V
Lead Channel Pacing Threshold Amplitude: 0.625 V
Lead Channel Pacing Threshold Amplitude: 0.75 V
Lead Channel Pacing Threshold Pulse Width: 0.4 ms
Lead Channel Pacing Threshold Pulse Width: 0.4 ms
Lead Channel Pacing Threshold Pulse Width: 0.4 ms
Lead Channel Pacing Threshold Pulse Width: 0.52 ms
Lead Channel Sensing Intrinsic Amplitude: 15.67 mV
Lead Channel Sensing Intrinsic Amplitude: 2 mV
Lead Channel Setting Pacing Amplitude: 1.5 V
Lead Channel Setting Pacing Amplitude: 2.5 V
Lead Channel Setting Pacing Pulse Width: 0.52 ms
Lead Channel Setting Sensing Sensitivity: 5.6 mV

## 2021-03-06 NOTE — Progress Notes (Signed)
Patient Care Team: Lauree Chandler, NP as PCP - General (Geriatric Medicine) Marygrace Drought, MD as Consulting Physician (Ophthalmology)   HPI  Alexandria Wright is a 87 y.o. female seen in followup for syncope, bradycardia, and status post pacemaker implantation in October 2008.  She underwent generator replacement 10/18.  She received a Medtronic Adapta device which is now on advisory recall.  The patient denies chest pain, shortness of breath, nocturnal dyspnea, orthopnea or peripheral edema.  There have been no palpitations, lightheadedness or syncope.    Date Cr K Hgb TSH  6/21     1.25  7/22 0.88 4.4 15.5      Echo 2018 Normal LV function  Past Medical History:  Diagnosis Date   Bradycardia    a. s/p MDT dual chamber PPM    Esophageal stricture    GERD (gastroesophageal reflux disease)    Skin cancer    Syncope    Tachycardia    asyptomatic nonsustained    Past Surgical History:  Procedure Laterality Date   CATARACT EXTRACTION  12/10/2011   right    CATARACT EXTRACTION  12/17/2011   left   PACEMAKER PLACEMENT  2008   MDT dual chamber PPM implanted by Dr Caryl Comes for symptomatic bradycardia   PPM GENERATOR CHANGEOUT N/A 03/04/2017   Procedure: PPM GENERATOR CHANGEOUT;  Surgeon: Deboraha Sprang, MD;  Location: [facility];  Service: Cardiovascular;  Laterality: N/A;   SKIN CANCER EXCISION     TONSILLECTOMY AND ADENOIDECTOMY      Current Outpatient Medications  Medication Sig Dispense Refill   Calcium Carbonate-Vitamin D (OSCAL 500/200 D-3 PO) Take 1 tablet by mouth daily.      Cholecalciferol (VITAMIN D3) 2000 units TABS Take 2,000 Units by mouth daily.     Misc Natural Product [facility] EYE DROPS #2 OP) Place 1 drop into both eyes daily as needed (for blurry eyes/irritation.). Complete Eye Relief     Multiple Vitamin (MULTIVITAMIN WITH MINERALS) TABS tablet Take 1 tablet by mouth daily with breakfast.     Nutritional Supplements (ENSURE ACTIVE  PO) Take 1 Bottle by mouth.     No current facility-administered medications for this visit.    Allergies  Allergen Reactions   Sulfonamide Derivatives Other (See Comments)    Made very sick, per pt    Penicillins Swelling and Rash    Has patient had a PCN reaction causing immediate rash, facial/tongue/throat swelling, SOB or lightheadedness with hypotension: Yes Has patient had a PCN reaction causing severe rash involving mucus membranes or skin necrosis: Yes Has patient had a PCN reaction that required hospitalization: No Has patient had a PCN reaction occurring within the last 10 years: No If all of the above answers are "NO", then may proceed with Cephalosporin use.       Review of Systems negative except from HPI and PMH  Physical Exam BP 124/80   Pulse (!) 102   Ht 5\' 4"  (1.626 m)   Wt 95 lb 12.8 oz (43.5 kg)   SpO2 97%   BMI 16.44 kg/m  Well developed and well nourished in no acute distress HENT normal Neck supple with JVP-flat Clear Device pocket well healed; without hematoma or erythema.  There is no tethering  Regular rate and rhythm, no  murmur Abd-soft with active BS No Clubbing cyanosis  edema Skin-warm and dry A & Oriented  Grossly normal sensory and motor function  ECG sinus at 102 19/08/32  Heart  rate histograms not dissimilar from dated 10/19--8/21  we will go with a significant proportion maybe 20% greater than 100 bpm   Assessment and  Plan   Sinus node dysfunction-intermittent  PVCs  Pacemaker Medtronic     Pacemaker on advisory recall   Sinus tachycardia longstanding no untoward  Device function is normal    Current medicines are reviewed at length with the patient today .  The patient does not  have concerns regarding medicines.

## 2021-03-06 NOTE — Patient Instructions (Signed)

## 2021-03-25 ENCOUNTER — Ambulatory Visit (INDEPENDENT_AMBULATORY_CARE_PROVIDER_SITE_OTHER): Payer: Medicare PPO

## 2021-03-25 DIAGNOSIS — R55 Syncope and collapse: Secondary | ICD-10-CM

## 2021-03-26 LAB — CUP PACEART REMOTE DEVICE CHECK
Battery Impedance: 379 Ohm
Battery Remaining Longevity: 103 mo
Battery Voltage: 2.77 V
Brady Statistic AP VP Percent: 0 %
Brady Statistic AP VS Percent: 1 %
Brady Statistic AS VP Percent: 0 %
Brady Statistic AS VS Percent: 99 %
Date Time Interrogation Session: 20221114092704
Implantable Lead Implant Date: 20080703
Implantable Lead Implant Date: 20080703
Implantable Lead Location: 753859
Implantable Lead Location: 753860
Implantable Lead Model: 5076
Implantable Lead Model: 5076
Implantable Pulse Generator Implant Date: 20181024
Lead Channel Impedance Value: 526 Ohm
Lead Channel Impedance Value: 610 Ohm
Lead Channel Pacing Threshold Amplitude: 0.5 V
Lead Channel Pacing Threshold Amplitude: 0.5 V
Lead Channel Pacing Threshold Pulse Width: 0.4 ms
Lead Channel Pacing Threshold Pulse Width: 0.4 ms
Lead Channel Setting Pacing Amplitude: 1.5 V
Lead Channel Setting Pacing Amplitude: 2.5 V
Lead Channel Setting Pacing Pulse Width: 0.4 ms
Lead Channel Setting Sensing Sensitivity: 5.6 mV

## 2021-04-02 NOTE — Progress Notes (Signed)
Remote pacemaker transmission.   

## 2021-06-24 ENCOUNTER — Ambulatory Visit (INDEPENDENT_AMBULATORY_CARE_PROVIDER_SITE_OTHER): Payer: Medicare PPO

## 2021-06-24 DIAGNOSIS — I495 Sick sinus syndrome: Secondary | ICD-10-CM

## 2021-06-24 LAB — CUP PACEART REMOTE DEVICE CHECK
Battery Impedance: 455 Ohm
Battery Remaining Longevity: 97 mo
Battery Voltage: 2.77 V
Brady Statistic AP VP Percent: 0 %
Brady Statistic AP VS Percent: 3 %
Brady Statistic AS VP Percent: 0 %
Brady Statistic AS VS Percent: 96 %
Date Time Interrogation Session: 20230213090327
Implantable Lead Implant Date: 20080703
Implantable Lead Implant Date: 20080703
Implantable Lead Location: 753859
Implantable Lead Location: 753860
Implantable Lead Model: 5076
Implantable Lead Model: 5076
Implantable Pulse Generator Implant Date: 20181024
Lead Channel Impedance Value: 482 Ohm
Lead Channel Impedance Value: 576 Ohm
Lead Channel Pacing Threshold Amplitude: 0.25 V
Lead Channel Pacing Threshold Amplitude: 0.5 V
Lead Channel Pacing Threshold Pulse Width: 0.4 ms
Lead Channel Pacing Threshold Pulse Width: 0.4 ms
Lead Channel Setting Pacing Amplitude: 1.5 V
Lead Channel Setting Pacing Amplitude: 2.5 V
Lead Channel Setting Pacing Pulse Width: 0.76 ms
Lead Channel Setting Sensing Sensitivity: 5.6 mV

## 2021-06-28 NOTE — Progress Notes (Signed)
Remote pacemaker transmission.   

## 2021-09-23 ENCOUNTER — Ambulatory Visit (INDEPENDENT_AMBULATORY_CARE_PROVIDER_SITE_OTHER): Payer: Medicare PPO

## 2021-09-23 ENCOUNTER — Telehealth: Payer: Self-pay | Admitting: Internal Medicine

## 2021-09-23 DIAGNOSIS — R55 Syncope and collapse: Secondary | ICD-10-CM | POA: Diagnosis not present

## 2021-09-23 NOTE — Telephone Encounter (Signed)
Unsuccessful telephone encounter to patient to inform todays transmission has been received. Unable to leave message as home phone continued to ring and cell does not have MV set up.  ?

## 2021-09-23 NOTE — Telephone Encounter (Signed)
°  1. Has your device fired? no ° °2. Is you device beeping? no ° °3. Are you experiencing draining or swelling at device site?no ° °4. Are you calling to see if we received your device transmission?yes ° °5. Have you passed out? no ° ° ° °Please route to Device Clinic Pool °

## 2021-09-24 LAB — CUP PACEART REMOTE DEVICE CHECK
Battery Impedance: 456 Ohm
Battery Remaining Longevity: 96 mo
Battery Voltage: 2.78 V
Brady Statistic AP VP Percent: 0 %
Brady Statistic AP VS Percent: 4 %
Brady Statistic AS VP Percent: 0 %
Brady Statistic AS VS Percent: 96 %
Date Time Interrogation Session: 20230515091359
Implantable Lead Implant Date: 20080703
Implantable Lead Implant Date: 20080703
Implantable Lead Location: 753859
Implantable Lead Location: 753860
Implantable Lead Model: 5076
Implantable Lead Model: 5076
Implantable Pulse Generator Implant Date: 20181024
Lead Channel Impedance Value: 489 Ohm
Lead Channel Impedance Value: 551 Ohm
Lead Channel Pacing Threshold Amplitude: 0.5 V
Lead Channel Pacing Threshold Amplitude: 0.75 V
Lead Channel Pacing Threshold Pulse Width: 0.4 ms
Lead Channel Pacing Threshold Pulse Width: 0.4 ms
Lead Channel Setting Pacing Amplitude: 1.5 V
Lead Channel Setting Pacing Amplitude: 2.5 V
Lead Channel Setting Pacing Pulse Width: 0.4 ms
Lead Channel Setting Sensing Sensitivity: 5.6 mV

## 2021-09-24 NOTE — Telephone Encounter (Signed)
Spoke with patient informed her that transmission was received  ?

## 2021-10-14 NOTE — Progress Notes (Signed)
Remote pacemaker transmission.   

## 2021-11-14 ENCOUNTER — Encounter: Payer: Self-pay | Admitting: Nurse Practitioner

## 2021-11-14 ENCOUNTER — Telehealth: Payer: Self-pay

## 2021-11-14 ENCOUNTER — Ambulatory Visit (INDEPENDENT_AMBULATORY_CARE_PROVIDER_SITE_OTHER): Payer: Medicare PPO | Admitting: Nurse Practitioner

## 2021-11-14 DIAGNOSIS — Z0000 Encounter for general adult medical examination without abnormal findings: Secondary | ICD-10-CM

## 2021-11-14 NOTE — Progress Notes (Signed)
   This service is provided via telemedicine  No vital signs collected/recorded due to the encounter was a telemedicine visit.   Location of patient (ex: home, work):  Home  Patient consents to a telephone visit: Yes, see telephone visit dated 11/14/21  Location of the provider (ex: office, home):  [place], [place]   Name of any referring provider:  N/A  Names of all persons participating in the telemedicine service and their role in the encounter:  S.Chrae B/CMA, Sherrie Mustache, NP, and Patient   Time spent on call:  11 min with medical assistant

## 2021-11-14 NOTE — Telephone Encounter (Signed)
Ms. lenaya, pietsch are scheduled for a virtual visit with your provider today.    Just as we do with appointments in the office, we must obtain your consent to participate.  Your consent will be active for this visit and any virtual visit you may have with one of our providers in the next 365 days.    If you have a MyChart account, I can also send a copy of this consent to you electronically.  All virtual visits are billed to your insurance company just like a traditional visit in the office.  As this is a virtual visit, video technology does not allow for your provider to perform a traditional examination.  This may limit your provider's ability to fully assess your condition.  If your provider identifies any concerns that need to be evaluated in person or the need to arrange testing such as labs, EKG, etc, we will make arrangements to do so.    Although advances in technology are sophisticated, we cannot ensure that it will always work on either your end or our end.  If the connection with a video visit is poor, we may have to switch to a telephone visit.  With either a video or telephone visit, we are not always able to ensure that we have a secure connection.   I need to obtain your verbal consent now.   Are you willing to proceed with your visit today?   DWAYNA KENTNER has provided verbal consent on 11/14/2021 for a virtual visit (video or telephone).   [facility] [place], [place] 11/14/2021  9:31 am

## 2021-11-14 NOTE — Patient Instructions (Signed)
Alexandria Wright , Thank you for taking time to come for your Medicare Wellness Visit. I appreciate your ongoing commitment to your health goals. Please review the following plan we discussed and let me know if I can assist you in the future.   Screening recommendations/referrals: Colonoscopy aged out Mammogram aged out Bone Density you have declined  Recommended yearly ophthalmology/optometry visit for glaucoma screening and checkup Recommended yearly dental visit for hygiene and checkup  Vaccinations: Influenza vaccine up to date Pneumococcal vaccine up to date Tdap vaccine up to date Shingles vaccine up to date    Advanced directives: on file  Conditions/risks identified: advance age  Next appointment: yearly    Preventive Care 10 Years and Older, Female Preventive care refers to lifestyle choices and visits with your health care provider that can promote health and wellness. What does preventive care include? A yearly physical exam. This is also called an annual well check. Dental exams once or twice a year. Routine eye exams. Ask your health care provider how often you should have your eyes checked. Personal lifestyle choices, including: Daily care of your teeth and gums. Regular physical activity. Eating a healthy diet. Avoiding tobacco and drug use. Limiting alcohol use. Practicing safe sex. Taking low-dose aspirin every day. Taking vitamin and mineral supplements as recommended by your health care provider. What happens during an annual well check? The services and screenings done by your health care provider during your annual well check will depend on your age, overall health, lifestyle risk factors, and family history of disease. Counseling  Your health care provider may ask you questions about your: Alcohol use. Tobacco use. Drug use. Emotional well-being. Home and relationship well-being. Sexual activity. Eating habits. History of falls. Memory and ability  to understand (cognition). Work and work Statistician. Reproductive health. Screening  You may have the following tests or measurements: Height, weight, and BMI. Blood pressure. Lipid and cholesterol levels. These may be checked every 5 years, or more frequently if you are over 67 years old. Skin check. Lung cancer screening. You may have this screening every year starting at age 53 if you have a 30-pack-year history of smoking and currently smoke or have quit within the past 15 years. Fecal occult blood test (FOBT) of the stool. You may have this test every year starting at age 36. Flexible sigmoidoscopy or colonoscopy. You may have a sigmoidoscopy every 5 years or a colonoscopy every 10 years starting at age 39. Hepatitis C blood test. Hepatitis B blood test. Sexually transmitted disease (STD) testing. Diabetes screening. This is done by checking your blood sugar (glucose) after you have not eaten for a while (fasting). You may have this done every 1-3 years. Bone density scan. This is done to screen for osteoporosis. You may have this done starting at age 22. Mammogram. This may be done every 1-2 years. Talk to your health care provider about how often you should have regular mammograms. Talk with your health care provider about your test results, treatment options, and if necessary, the need for more tests. Vaccines  Your health care provider may recommend certain vaccines, such as: Influenza vaccine. This is recommended every year. Tetanus, diphtheria, and acellular pertussis (Tdap, Td) vaccine. You may need a Td booster every 10 years. Zoster vaccine. You may need this after age 85. Pneumococcal 13-valent conjugate (PCV13) vaccine. One dose is recommended after age 43. Pneumococcal polysaccharide (PPSV23) vaccine. One dose is recommended after age 19. Talk to your health care provider about  which screenings and vaccines you need and how often you need them. This information is not  intended to replace advice given to you by your health care provider. Make sure you discuss any questions you have with your health care provider. Document Released: 05/25/2015 Document Revised: 01/16/2016 Document Reviewed: 02/27/2015 Elsevier Interactive Patient Education  2017 [place] Prevention in the Home Falls can cause injuries. They can happen to people of all ages. There are many things you can do to make your home safe and to help prevent falls. What can I do on the outside of my home? Regularly fix the edges of walkways and driveways and fix any cracks. Remove anything that might make you trip as you walk through a door, such as a raised step or threshold. Trim any bushes or trees on the path to your home. Use bright outdoor lighting. Clear any walking paths of anything that might make someone trip, such as rocks or tools. Regularly check to see if handrails are loose or broken. Make sure that both sides of any steps have handrails. Any raised decks and porches should have guardrails on the edges. Have any leaves, snow, or ice cleared regularly. Use sand or salt on walking paths during winter. Clean up any spills in your garage right away. This includes oil or grease spills. What can I do in the bathroom? Use night lights. Install grab bars by the toilet and in the tub and shower. Do not use towel bars as grab bars. Use non-skid mats or decals in the tub or shower. If you need to sit down in the shower, use a plastic, non-slip stool. Keep the floor dry. Clean up any water that spills on the floor as soon as it happens. Remove soap buildup in the tub or shower regularly. Attach bath mats securely with double-sided non-slip rug tape. Do not have throw rugs and other things on the floor that can make you trip. What can I do in the bedroom? Use night lights. Make sure that you have a light by your bed that is easy to reach. Do not use any sheets or blankets that are  too big for your bed. They should not hang down onto the floor. Have a firm chair that has side arms. You can use this for support while you get dressed. Do not have throw rugs and other things on the floor that can make you trip. What can I do in the kitchen? Clean up any spills right away. Avoid walking on wet floors. Keep items that you use a lot in easy-to-reach places. If you need to reach something above you, use a strong step stool that has a grab bar. Keep electrical cords out of the way. Do not use floor polish or wax that makes floors slippery. If you must use wax, use non-skid floor wax. Do not have throw rugs and other things on the floor that can make you trip. What can I do with my stairs? Do not leave any items on the stairs. Make sure that there are handrails on both sides of the stairs and use them. Fix handrails that are broken or loose. Make sure that handrails are as long as the stairways. Check any carpeting to make sure that it is firmly attached to the stairs. Fix any carpet that is loose or worn. Avoid having throw rugs at the top or bottom of the stairs. If you do have throw rugs, attach them to the floor with  carpet tape. Make sure that you have a light switch at the top of the stairs and the bottom of the stairs. If you do not have them, ask someone to add them for you. What else can I do to help prevent falls? Wear shoes that: Do not have high heels. Have rubber bottoms. Are comfortable and fit you well. Are closed at the toe. Do not wear sandals. If you use a stepladder: Make sure that it is fully opened. Do not climb a closed stepladder. Make sure that both sides of the stepladder are locked into place. Ask someone to hold it for you, if possible. Clearly mark and make sure that you can see: Any grab bars or handrails. First and last steps. Where the edge of each step is. Use tools that help you move around (mobility aids) if they are needed. These  include: Canes. Walkers. Scooters. Crutches. Turn on the lights when you go into a dark area. Replace any light bulbs as soon as they burn out. Set up your furniture so you have a clear path. Avoid moving your furniture around. If any of your floors are uneven, fix them. If there are any pets around you, be aware of where they are. Review your medicines with your doctor. Some medicines can make you feel dizzy. This can increase your chance of falling. Ask your doctor what other things that you can do to help prevent falls. This information is not intended to replace advice given to you by your health care provider. Make sure you discuss any questions you have with your health care provider. Document Released: 02/22/2009 Document Revised: 10/04/2015 Document Reviewed: 06/02/2014 Elsevier Interactive Patient Education  2017 [employer].

## 2021-11-14 NOTE — Progress Notes (Signed)
Subjective:   Alexandria Wright is a 88 y.o. female who presents for Medicare Annual (Subsequent) preventive examination.  Review of Systems     Cardiac Risk Factors include: advanced age (>23mn, >>82women)     Objective:    There were no vitals filed for this visit. There is no height or weight on file to calculate BMI.     11/14/2021    8:10 AM 11/19/2020   10:53 AM 11/06/2020    8:24 AM 11/10/2019    8:12 AM 11/03/2019    8:26 AM 11/02/2018    8:59 AM 09/21/2017    1:02 PM  Advanced Directives  Does Patient Have a Medical Advance Directive? Yes Yes Yes Yes Yes No Yes  Type of AParamed[place]of [place] will;Out of facility DNR (pink MOST or yellow form) H[place]Living will HEast MiddleburyLiving will HF[place]Living will;Out of facility DNR (pink MOST or yellow form) Living will;Out of facility DNR (pink MOST or yellow form)  Out of facility DNR (pink MOST or yellow form)  Does patient want to make changes to medical advance directive? No - Patient declined No - Patient declined No - Patient declined No - Guardian declined No - Patient declined  No - Patient declined  Copy of [place] Chart? Yes - validated most recent copy scanned in chart (See row information) Yes - validated most recent copy scanned in chart (See row information) Yes - validated most recent copy scanned in chart (See row information) Yes - validated most recent copy scanned in chart (See row information)     Would patient like information on creating a medical advance directive?      No - Patient declined   Pre-existing out of facility DNR order (yellow form or pink MOST form) Yellow form placed in chart (order not valid for inpatient use);Pink MOST form placed in chart (order not valid for inpatient use)   Pink MOST/Yellow Form most recent copy in chart - Physician notified to receive inpatient order Yellow form  placed in chart (order not valid for inpatient use)  Pink MOST form placed in chart (order not valid for inpatient use);Yellow form placed in chart (order not valid for inpatient use)    Current Medications (verified) Outpatient Encounter Medications as of 11/14/2021  Medication Sig   Calcium Carbonate-Vitamin D (OSCAL 500/200 D-3 PO) Take 1 tablet by mouth daily.    Cholecalciferol (VITAMIN D3) 2000 units TABS Take 2,000 Units by mouth daily.   Misc Natural Product ([place] [place] IncEYE DROPS #2 OP) Place 1 drop into both eyes daily as needed (for blurry eyes/irritation.). Complete Eye Relief   Multiple Vitamin (MULTIVITAMIN WITH MINERALS) TABS tablet Take 1 tablet by mouth daily with breakfast.   Nutritional Supplements (ENSURE ACTIVE PO) Take 1 Bottle by mouth.   No facility-administered encounter medications on file as of 11/14/2021.    Allergies (verified) Sulfonamide derivatives and Penicillins   History: Past Medical History:  Diagnosis Date   Bradycardia    a. s/p MDT dual chamber PPM    Esophageal stricture    GERD (gastroesophageal reflux disease)    Skin cancer    Syncope    Tachycardia    asyptomatic nonsustained   Past Surgical History:  Procedure Laterality Date   CATARACT EXTRACTION  12/10/2011   right    CATARACT EXTRACTION  12/17/2011   left   PACEMAKER PLACEMENT  2008   MDT dual  chamber PPM implanted by Dr Caryl Comes for symptomatic bradycardia   PPM GENERATOR CHANGEOUT N/A 03/04/2017   Procedure: PPM GENERATOR CHANGEOUT;  Surgeon: Deboraha Sprang, MD;  Location: [facility];  Service: Cardiovascular;  Laterality: N/A;   SKIN CANCER EXCISION     TONSILLECTOMY AND ADENOIDECTOMY     Family History  Problem Relation Age of Onset   Heart disease Mother    Colon cancer Neg Hx    Social History   Socioeconomic History   Marital status: Widowed    Spouse name: Not on file   Number of children: 0   Years of education: Not on file   Highest education level: Not on  file  Occupational History   Occupation: retired Pharmacist, hospital  Tobacco Use   Smoking status: Never   Smokeless tobacco: Never  Substance and Sexual Activity   Alcohol use: No   Drug use: No   Sexual activity: Not on file  Other Topics Concern   Not on file  Social History Narrative   Widow -husband died 2003-08-23   Lives alone   Never smoked   Alcohol none   Walks 8 miles daily 5 days a week    Living Will   Social Determinants of Health   Financial Resource Strain: Low Risk  (09/21/2017)   Overall Financial Resource Strain (CARDIA)    Difficulty of Paying Living Expenses: Not hard at all  Food Insecurity: No Food Insecurity (09/21/2017)   Hunger Vital Sign    Worried About Running Out of Food in the Last Year: Never true    [place] in the Last Year: Never true  Transportation Needs: No Transportation Needs (09/21/2017)   PRAPARE - Hydrologist (Medical): No    Lack of Transportation (Non-Medical): No  Physical Activity: Sufficiently Active (09/21/2017)   Exercise Vital Sign    Days of Exercise per Week: 7 days    Minutes of Exercise per Session: 150+ min  Stress: No Stress Concern Present (09/21/2017)   [place]    Feeling of Stress : Not at all  Social Connections: Somewhat Isolated (09/21/2017)   Social Connection and Isolation Panel [NHANES]    Frequency of Communication with Friends and Family: More than three times a week    Frequency of Social Gatherings with Friends and Family: More than three times a week    Attends Religious Services: More than 4 times per year    Active Member of [employer] or Organizations: No    Attends Archivist Meetings: Never    Marital Status: Widowed    Tobacco Counseling Counseling given: Not Answered   Clinical Intake:  Pre-visit preparation completed: Yes  Pain : No/denies pain     BMI - recorded: 16.4 Nutritional Risks:  None Diabetes: No  How often do you need to have someone help you when you read instructions, pamphlets, or other written materials from your doctor or pharmacy?: 1 - Never  Diabetic?no         Activities of Daily Living    11/14/2021   10:10 AM  In your present state of health, do you have any difficulty performing the following activities:  Hearing? 0  Vision? 0  Difficulty concentrating or making decisions? 0  Walking or climbing stairs? 0  Dressing or bathing? 0  Doing errands, shopping? 0  Preparing Food and eating ? N  Using the Toilet? N  In the past six months, have you accidently leaked urine? N  Do you have problems with loss of bowel control? N  Managing your Medications? N  Managing your Finances? N  Housekeeping or managing your Housekeeping? N    Patient Care Team: Lauree Chandler, NP as PCP - General (Geriatric Medicine) Marygrace Drought, MD as Consulting Physician (Ophthalmology)  Indicate any recent Medical Services you may have received from other than [facility] providers in the past year (date may be approximate).     Assessment:   This is a routine wellness examination for Ivannia.  Hearing/Vision screen Hearing Screening - Comments:: No hearing issues at present  Vision Screening - Comments:: Last eye exam  greater than 1 year ago with Dr.[facility], Patient plans to schedule appointment in the near future   Dietary issues and exercise activities discussed: Current Exercise Habits: The patient does not participate in regular exercise at present   Goals Addressed   None    Depression Screen    11/14/2021    8:11 AM 11/06/2020    8:20 AM 11/10/2019    8:12 AM 11/03/2019    8:35 AM 11/02/2018    9:00 AM 11/02/2018    8:58 AM 11/01/2018    9:16 AM  PHQ 2/9 Scores  PHQ - 2 Score 0 0 0 0 0 0 0    Fall Risk    11/14/2021    8:10 AM 11/19/2020   10:52 AM 11/06/2020    8:22 AM 11/10/2019    8:11 AM 11/03/2019    8:36 AM  [place] in the past year?  0 0 0 0 0  Number falls in past yr: 0 0 0 0 0  Injury with Fall? 0 0 0 0 0  Risk for fall due to : No Fall Risks No Fall Risks     Follow up Falls evaluation completed Falls evaluation completed       FALL RISK PREVENTION PERTAINING TO THE HOME:  Any stairs in or around the home? Yes  If so, are there any without handrails? No  Home free of loose throw rugs in walkways, pet beds, electrical cords, etc? Yes  Adequate lighting in your home to reduce risk of falls? Yes   ASSISTIVE DEVICES UTILIZED TO PREVENT FALLS:  Life alert? No  Use of a cane, walker or w/c? No  Grab bars in the bathroom? Yes  Shower chair or bench in shower? No  Elevated toilet seat or a handicapped toilet? No   TIMED UP AND GO:  Was the test performed? No .   Cognitive Function:    11/02/2018    9:00 AM 09/21/2017    1:07 PM 09/01/2016    8:53 AM 08/31/2015    9:02 AM 08/25/2014    9:01 AM  MMSE - Mini Mental State Exam  Orientation to time '5 5 5 5 5  '$ Orientation to Place '5 5 5 5 5  '$ Registration '3 3 3 3 3  '$ Attention/ Calculation '5 5 5 5 5  '$ Recall '3 2 3 3 2  '$ Language- name 2 objects '2 2 2 2 2  '$ Language- repeat '1 1 1 1 1  '$ Language- follow 3 step command '3 3 3 3 3  '$ Language- read & follow direction '1 1 1 1 1  '$ Write a sentence '1 1 1 1 1  '$ Copy design '1 1 1 1 1  '$ Total score '30 29 30 30 '$ 29  11/14/2021    9:39 AM 11/06/2020    8:24 AM 11/03/2019    8:37 AM  6CIT Screen  What Year? 0 points 0 points 0 points  What month? 0 points 0 points 0 points  What time? 0 points 0 points 0 points  Count back from 20 0 points 0 points 0 points  Months in reverse 0 points 2 points 0 points  Repeat phrase 0 points 0 points   Total Score 0 points 2 points     Immunizations Immunization History  Administered Date(s) Administered   Influenza, High Dose Seasonal PF 01/29/2017, 02/04/2018, 02/24/2019, 03/07/2020   Influenza-Unspecified 01/25/2013, 02/05/2016   PFIZER(Purple Top)SARS-COV-2 Vaccination  06/16/2019, 07/13/2019, 02/13/2020, 09/03/2020   Pfizer Covid-19 Vaccine Bivalent Booster 79yr & up 03/07/2021   Pneumococcal Conjugate-13 08/25/2014, 02/05/2016   Pneumococcal Polysaccharide-23 08/26/2011   Td 01/05/2020   Tdap 07/11/2011, 01/05/2020   Zoster Recombinat (Shingrix) 09/04/2016, 12/04/2016   Zoster, Live 08/23/2013    TDAP status: Up to date  Flu Vaccine status: Up to date  Pneumococcal vaccine status: Up to date  Covid-19 vaccine status: Information provided on how to obtain vaccines.   Qualifies for Shingles Vaccine? Yes   Zostavax completed Yes   Shingrix Completed?: Yes  Screening Tests Health Maintenance  Topic Date Due   COVID-19 Vaccine (6 - Pfizer series) 07/08/2021   INFLUENZA VACCINE  12/10/2021   TETANUS/TDAP  01/04/2030   Pneumonia Vaccine 88 Years old  Completed   DEXA SCAN  Completed   Zoster Vaccines- Shingrix  Completed   HPV VACCINES  Aged Out    Health Maintenance  Health Maintenance Due  Topic Date Due   COVID-19 Vaccine (6 - Pfizer series) 07/08/2021    Colorectal cancer screening: No longer required.   Mammogram status: No longer required due to age.  Declines bone density.    Lung Cancer Screening: (Low Dose CT Chest recommended if Age 714-80years, 30 pack-year currently smoking OR have quit w/in 15years.) does not qualify.   Lung Cancer Screening Referral: na  Additional Screening:  Hepatitis C Screening: does not qualify; Completed na  Vision Screening: Recommended annual ophthalmology exams for early detection of glaucoma and other disorders of the eye. Is the patient up to date with their annual eye exam?  Yes  Who is the provider or what is the name of the office in which the patient attends annual eye exams? [facility] If pt is not established with a provider, would they like to be referred to a provider to establish care? No .   Dental Screening: Recommended annual dental exams for proper oral hygiene  Community  Resource Referral / Chronic Care Management: CRR required this visit?  No   CCM required this visit?  No      Plan:     I have personally reviewed and noted the following in the patient's chart:   Medical and social history Use of alcohol, tobacco or illicit drugs  Current medications and supplements including opioid prescriptions.  Functional ability and status Nutritional status Physical activity Advanced directives List of other physicians Hospitalizations, surgeries, and ER visits in previous 12 months Vitals Screenings to include cognitive, depression, and falls Referrals and appointments  In addition, I have reviewed and discussed with patient certain preventive protocols, quality metrics, and best practice recommendations. A written personalized care plan for preventive services as well as general preventive health recommendations were provided to patient.     JLauree Chandler NP   11/14/2021  Virtual Visit via Telephone Note  I connected with patient 11/14/21 at  9:40 AM EDT by telephone and verified that I am speaking with the correct person using two identifiers.  Location: Patient: home Provider: [facility]   I discussed the limitations, risks, security and privacy concerns of performing an evaluation and management service by telephone and the availability of in person appointments. I also discussed with the patient that there may be a patient responsible charge related to this service. The patient expressed understanding and agreed to proceed.   I discussed the assessment and treatment plan with the patient. The patient was provided an opportunity to ask questions and all were answered. The patient agreed with the plan and demonstrated an understanding of the instructions.   The patient was advised to call back or seek an in-person evaluation if the symptoms worsen or if the condition fails to improve as anticipated.  I provided 15 minutes of  non-face-to-face time during this encounter.  Carlos American. Harle Battiest [employer]s printed and mailed

## 2021-11-18 ENCOUNTER — Other Ambulatory Visit: Payer: Medicare PPO

## 2021-11-18 DIAGNOSIS — E7800 Pure hypercholesterolemia, unspecified: Secondary | ICD-10-CM

## 2021-11-18 DIAGNOSIS — I10 Essential (primary) hypertension: Secondary | ICD-10-CM | POA: Diagnosis not present

## 2021-11-19 LAB — COMPLETE METABOLIC PANEL WITH GFR
AG Ratio: 1.8 (calc) (ref 1.0–2.5)
ALT: 8 U/L (ref 6–29)
AST: 14 U/L (ref 10–35)
Albumin: 4.1 g/dL (ref 3.6–5.1)
Alkaline phosphatase (APISO): 57 U/L (ref 37–153)
BUN: 17 mg/dL (ref 7–25)
CO2: 30 mmol/L (ref 20–32)
Calcium: 9.4 mg/dL (ref 8.6–10.4)
Chloride: 101 mmol/L (ref 98–110)
Creat: 0.89 mg/dL (ref 0.60–0.95)
Globulin: 2.3 g/dL (calc) (ref 1.9–3.7)
Glucose, Bld: 94 mg/dL (ref 65–99)
Potassium: 4.2 mmol/L (ref 3.5–5.3)
Sodium: 138 mmol/L (ref 135–146)
Total Bilirubin: 0.6 mg/dL (ref 0.2–1.2)
Total Protein: 6.4 g/dL (ref 6.1–8.1)
eGFR: 62 mL/min/{1.73_m2} (ref >=60)

## 2021-11-19 LAB — CBC WITH DIFFERENTIAL/PLATELET
Absolute Monocytes: 660 cells/uL (ref 200–950)
Basophils Absolute: 53 cells/uL (ref 0–200)
Basophils Relative: 0.8 %
Eosinophils Absolute: 152 cells/uL (ref 15–500)
Eosinophils Relative: 2.3 %
HCT: 44.2 % (ref 35.0–45.0)
Hemoglobin: 14.9 g/dL (ref 11.7–15.5)
Lymphs Abs: 983 cells/uL (ref 850–3900)
MCH: 32.5 pg (ref 27.0–33.0)
MCHC: 33.7 g/dL (ref 32.0–36.0)
MCV: 96.3 fL (ref 80.0–100.0)
MPV: 10.7 fL (ref 7.5–12.5)
Monocytes Relative: 10 %
Neutro Abs: 4752 cells/uL (ref 1500–7800)
Neutrophils Relative %: 72 %
Platelets: 225 10*3/uL (ref 140–400)
RBC: 4.59 10*6/uL (ref 3.80–5.10)
RDW: 12.1 % (ref 11.0–15.0)
Total Lymphocyte: 14.9 %
WBC: 6.6 10*3/uL (ref 3.8–10.8)

## 2021-11-19 LAB — LIPID PANEL
Cholesterol: 212 mg/dL — ABNORMAL HIGH (ref <200)
HDL: 57 mg/dL (ref >=50)
LDL Cholesterol (Calc): 133 mg/dL (calc) — ABNORMAL HIGH
Non-HDL Cholesterol (Calc): 155 mg/dL (calc) — ABNORMAL HIGH (ref <130)
Total CHOL/HDL Ratio: 3.7 (calc) (ref <5.0)
Triglycerides: 117 mg/dL (ref <150)

## 2021-11-20 ENCOUNTER — Ambulatory Visit: Payer: Medicare PPO | Admitting: Nurse Practitioner

## 2021-11-22 ENCOUNTER — Ambulatory Visit: Payer: Medicare PPO | Admitting: Nurse Practitioner

## 2021-12-02 ENCOUNTER — Ambulatory Visit: Payer: Medicare PPO | Admitting: Nurse Practitioner

## 2021-12-02 ENCOUNTER — Encounter: Payer: Self-pay | Admitting: Nurse Practitioner

## 2021-12-02 VITALS — BP 120/72 | HR 99 | Temp 97.3°F | Ht 63.0 in | Wt 92.0 lb

## 2021-12-02 DIAGNOSIS — R636 Underweight: Secondary | ICD-10-CM | POA: Diagnosis not present

## 2021-12-02 DIAGNOSIS — M810 Age-related osteoporosis without current pathological fracture: Secondary | ICD-10-CM

## 2021-12-02 DIAGNOSIS — Z950 Presence of cardiac pacemaker: Secondary | ICD-10-CM | POA: Diagnosis not present

## 2021-12-02 DIAGNOSIS — I495 Sick sinus syndrome: Secondary | ICD-10-CM | POA: Diagnosis not present

## 2021-12-02 DIAGNOSIS — E7800 Pure hypercholesterolemia, unspecified: Secondary | ICD-10-CM

## 2021-12-02 NOTE — Progress Notes (Unsigned)
Careteam: Patient Care Team: Lauree Chandler, NP as PCP - General (Geriatric Medicine) Marygrace Drought, MD as Consulting Physician (Ophthalmology)  PLACE OF SERVICE:  [place] Directive information Does Patient Have a Medical Advance Directive?: Yes, Type of Advance Directive: [place];Living will;Out of facility DNR (pink MOST or yellow form), Pre-existing out of facility DNR order (yellow form or pink MOST form): Yellow form placed in chart (order not valid for inpatient use), Does patient want to make changes to medical advance directive?: No - Patient declined  Allergies  Allergen Reactions   Sulfonamide Derivatives Other (See Comments)    Made very sick, per pt    Penicillins Swelling and Rash    Has patient had a PCN reaction causing immediate rash, facial/tongue/throat swelling, SOB or lightheadedness with hypotension: Yes Has patient had a PCN reaction causing severe rash involving mucus membranes or skin necrosis: Yes Has patient had a PCN reaction that required hospitalization: No Has patient had a PCN reaction occurring within the last 10 years: No If all of the above answers are "NO", then may proceed with Cephalosporin use.     Chief Complaint  Patient presents with   Medical Management of Chronic Issues    12 month follow-up for routine visit and discuss recent labs (copy printed)      HPI: Patient is a 89 y.o. female for rontine follow up.   She reports she has no concern. She lives alone but reports she has family that keeps tabs on her frequently.   Reports she only eats 2 meals a day ,she also drinks ensure in the morning.   Declines any additional bone density.   Followed by cardiologist due to bradycardia sp pacer.  Followed by dermatology   She does not wish to follow up any sooner than 1 year.   Review of Systems:  Review of Systems  Constitutional:  Positive for weight loss. Negative for chills and fever.   HENT:  Negative for tinnitus.   Respiratory:  Negative for cough, sputum production and shortness of breath.   Cardiovascular:  Negative for chest pain, palpitations and leg swelling.  Gastrointestinal:  Negative for abdominal pain, constipation, diarrhea and heartburn.  Genitourinary:  Negative for dysuria, frequency and urgency.  Musculoskeletal:  Negative for back pain, falls, joint pain and myalgias.  Skin: Negative.   Neurological:  Negative for dizziness and headaches.  Psychiatric/Behavioral:  Negative for depression and memory loss. The patient does not have insomnia.     Past Medical History:  Diagnosis Date   Bradycardia    a. s/p MDT dual chamber PPM    Esophageal stricture    GERD (gastroesophageal reflux disease)    Skin cancer    Syncope    Tachycardia    asyptomatic nonsustained   Past Surgical History:  Procedure Laterality Date   CATARACT EXTRACTION  12/10/2011   right    CATARACT EXTRACTION  12/17/2011   left   PACEMAKER PLACEMENT  2008   MDT dual chamber PPM implanted by Dr Caryl Comes for symptomatic bradycardia   PPM GENERATOR CHANGEOUT N/A 03/04/2017   Procedure: PPM GENERATOR CHANGEOUT;  Surgeon: Deboraha Sprang, MD;  Location: [facility];  Service: Cardiovascular;  Laterality: N/A;   SKIN CANCER EXCISION     TONSILLECTOMY AND ADENOIDECTOMY     Social History:   reports that she has never smoked. She has never used smokeless tobacco. She reports that she does not drink alcohol  and does not use drugs.  Family History  Problem Relation Age of Onset   Heart disease Mother    Colon cancer Neg Hx     Medications: Patient's Medications  New Prescriptions   No medications on file  Previous Medications   CALCIUM CARBONATE-VITAMIN D (OSCAL 500/200 D-3 PO)    Take 1 tablet by mouth daily.    CHOLECALCIFEROL (VITAMIN D3) 2000 UNITS TABS    Take 2,000 Units by mouth daily.   MISC NATURAL PRODUCT (SIMILASAN EYE DROPS #2 OP)    Place 1 drop into both eyes  daily as needed (for blurry eyes/irritation.). Complete Eye Relief   MULTIPLE VITAMIN (MULTIVITAMIN WITH MINERALS) TABS TABLET    Take 1 tablet by mouth daily with breakfast.   NUTRITIONAL SUPPLEMENTS (ENSURE ACTIVE PO)    Take 1 Bottle by mouth.  Modified Medications   No medications on file  Discontinued Medications   No medications on file    Physical Exam:  Vitals:   12/02/21 0935  BP: 120/72  Pulse: 99  Temp: (!) 97.3 F (36.3 C)  TempSrc: Temporal  SpO2: 98%  Weight: 92 lb (41.7 kg)  Height: '5\' 3"'$  (1.6 m)   Body mass index is 16.3 kg/m. Wt Readings from Last 3 Encounters:  12/02/21 92 lb (41.7 kg)  03/06/21 95 lb 12.8 oz (43.5 kg)  11/19/20 94 lb 6.4 oz (42.8 kg)    Physical Exam Constitutional:      General: She is not in acute distress.    Appearance: She is well-developed. She is not diaphoretic.  HENT:     Head: Normocephalic and atraumatic.     Mouth/Throat:     Pharynx: No oropharyngeal exudate.  Eyes:     Conjunctiva/sclera: Conjunctivae normal.     Pupils: Pupils are equal, round, and reactive to light.  Cardiovascular:     Rate and Rhythm: Normal rate and regular rhythm.     Heart sounds: Normal heart sounds.  Pulmonary:     Effort: Pulmonary effort is normal.     Breath sounds: Normal breath sounds.  Abdominal:     General: Bowel sounds are normal.     Palpations: Abdomen is soft.  Musculoskeletal:     Cervical back: Normal range of motion and neck supple.     Right lower leg: No edema.     Left lower leg: No edema.  Skin:    General: Skin is warm and dry.  Neurological:     Mental Status: She is alert and oriented to person, place, and time. Mental status is at baseline.  Psychiatric:        Mood and Affect: Mood normal.    Labs reviewed: Basic Metabolic Panel: Recent Labs    11/18/21 0800  NA 138  K 4.2  CL 101  CO2 30  GLUCOSE 94  BUN 17  CREATININE 0.89  CALCIUM 9.4   Liver Function Tests: Recent Labs    11/18/21 0800   AST 14  ALT 8  BILITOT 0.6  PROT 6.4   No results for input(s): "LIPASE", "AMYLASE" in the last 8760 hours. No results for input(s): "AMMONIA" in the last 8760 hours. CBC: Recent Labs    11/18/21 0800  WBC 6.6  NEUTROABS 4,752  HGB 14.9  HCT 44.2  MCV 96.3  PLT 225   Lipid Panel: Recent Labs    11/18/21 0800  CHOL 212*  HDL 57  LDLCALC 133*  TRIG 117  CHOLHDL 3.7   TSH:  No results for input(s): "TSH" in the last 8760 hours. A1C: Lab Results  Component Value Date   HGBA1C 5.2 11/13/2020     Assessment/Plan 1. Pure hypercholesterolemia -stable, not currently on medications, continues dietary modifications.   2. Underweight -noted, she also has had weight loss, encouraged to liberalize diet. To have protein supplement in addition to smallest meal of the day.  3. Tachy-brady syndrome ([place]) -stable, managed without medications, now s/p pacemaker.   4. S/P placement of cardiac pacemaker Followed by cardiology.   5. Senile osteoporosis Does not wish for any more follow up dexa scan regarding osteoporosis and does not wish to be on medication.  Recommended to take calcium 600 mg twice daily with Vitamin D 2000 units daily and weight bearing activity 30 mins/5 days a week  Return in about 1 year (around 12/03/2022) for routine follow up, labs prior to visit .:  Soumya Colson K. [place], [facility] [phone]

## 2021-12-23 ENCOUNTER — Ambulatory Visit: Payer: Medicare PPO

## 2021-12-25 LAB — CUP PACEART REMOTE DEVICE CHECK
Battery Impedance: 506 Ohm
Battery Remaining Longevity: 92 mo
Battery Voltage: 2.77 V
Brady Statistic AP VP Percent: 0 %
Brady Statistic AP VS Percent: 3 %
Brady Statistic AS VP Percent: 0 %
Brady Statistic AS VS Percent: 96 %
Date Time Interrogation Session: 20230815110553
Implantable Lead Implant Date: 20080703
Implantable Lead Implant Date: 20080703
Implantable Lead Location: 753859
Implantable Lead Location: 753860
Implantable Lead Model: 5076
Implantable Lead Model: 5076
Implantable Pulse Generator Implant Date: 20181024
Lead Channel Impedance Value: 462 Ohm
Lead Channel Impedance Value: 526 Ohm
Lead Channel Pacing Threshold Amplitude: 0.625 V
Lead Channel Pacing Threshold Amplitude: 0.75 V
Lead Channel Pacing Threshold Pulse Width: 0.4 ms
Lead Channel Pacing Threshold Pulse Width: 0.4 ms
Lead Channel Setting Pacing Amplitude: 1.5 V
Lead Channel Setting Pacing Amplitude: 2.5 V
Lead Channel Setting Pacing Pulse Width: 0.4 ms
Lead Channel Setting Sensing Sensitivity: 5.6 mV

## 2022-02-20 DIAGNOSIS — D1801 Hemangioma of skin and subcutaneous tissue: Secondary | ICD-10-CM | POA: Diagnosis not present

## 2022-02-20 DIAGNOSIS — L570 Actinic keratosis: Secondary | ICD-10-CM | POA: Diagnosis not present

## 2022-02-20 DIAGNOSIS — Z85828 Personal history of other malignant neoplasm of skin: Secondary | ICD-10-CM | POA: Diagnosis not present

## 2022-02-20 DIAGNOSIS — C44519 Basal cell carcinoma of skin of other part of trunk: Secondary | ICD-10-CM | POA: Diagnosis not present

## 2022-02-20 DIAGNOSIS — L821 Other seborrheic keratosis: Secondary | ICD-10-CM | POA: Diagnosis not present

## 2022-03-24 ENCOUNTER — Ambulatory Visit (INDEPENDENT_AMBULATORY_CARE_PROVIDER_SITE_OTHER): Payer: Medicare PPO

## 2022-03-24 DIAGNOSIS — R55 Syncope and collapse: Secondary | ICD-10-CM

## 2022-03-25 LAB — CUP PACEART REMOTE DEVICE CHECK
Battery Impedance: 584 Ohm
Battery Remaining Longevity: 86 mo
Battery Voltage: 2.78 V
Brady Statistic AP VP Percent: 0 %
Brady Statistic AP VS Percent: 3 %
Brady Statistic AS VP Percent: 0 %
Brady Statistic AS VS Percent: 96 %
Date Time Interrogation Session: 20231113090336
Implantable Lead Connection Status: 753985
Implantable Lead Connection Status: 753985
Implantable Lead Implant Date: 20080703
Implantable Lead Implant Date: 20080703
Implantable Lead Location: 753859
Implantable Lead Location: 753860
Implantable Lead Model: 5076
Implantable Lead Model: 5076
Implantable Pulse Generator Implant Date: 20181024
Lead Channel Impedance Value: 488 Ohm
Lead Channel Impedance Value: 596 Ohm
Lead Channel Pacing Threshold Amplitude: 0.625 V
Lead Channel Pacing Threshold Amplitude: 0.75 V
Lead Channel Pacing Threshold Pulse Width: 0.4 ms
Lead Channel Pacing Threshold Pulse Width: 0.4 ms
Lead Channel Setting Pacing Amplitude: 1.5 V
Lead Channel Setting Pacing Amplitude: 2.5 V
Lead Channel Setting Pacing Pulse Width: 0.4 ms
Lead Channel Setting Sensing Sensitivity: 5.6 mV
Zone Setting Status: 755011
Zone Setting Status: 755011

## 2022-05-07 NOTE — Progress Notes (Signed)
Remote pacemaker transmission.   

## 2022-05-13 ENCOUNTER — Encounter: Payer: Self-pay | Admitting: Internal Medicine

## 2022-05-13 ENCOUNTER — Ambulatory Visit: Payer: Medicare PPO | Attending: Internal Medicine | Admitting: Internal Medicine

## 2022-05-13 VITALS — BP 147/76 | HR 94 | Ht 63.0 in | Wt 89.0 lb

## 2022-05-13 DIAGNOSIS — I495 Sick sinus syndrome: Secondary | ICD-10-CM

## 2022-05-13 DIAGNOSIS — Z950 Presence of cardiac pacemaker: Secondary | ICD-10-CM

## 2022-05-13 LAB — CUP PACEART INCLINIC DEVICE CHECK
Battery Remaining Longevity: 89 mo
Brady Statistic RA Percent Paced: 3.6 %
Brady Statistic RV Percent Paced: 0.5 %
Date Time Interrogation Session: 20240102172843
Implantable Lead Connection Status: 753985
Implantable Lead Connection Status: 753985
Implantable Lead Implant Date: 20080703
Implantable Lead Implant Date: 20080703
Implantable Lead Location: 753859
Implantable Lead Location: 753860
Implantable Lead Model: 5076
Implantable Lead Model: 5076
Implantable Pulse Generator Implant Date: 20181024
Lead Channel Impedance Value: 22 Ohm
Lead Channel Impedance Value: 535 Ohm
Lead Channel Impedance Value: 535 Ohm
Lead Channel Impedance Value: 607 Ohm
Lead Channel Pacing Threshold Amplitude: 0.75 V
Lead Channel Pacing Threshold Amplitude: 1 V
Lead Channel Pacing Threshold Pulse Width: 0.4 ms
Lead Channel Pacing Threshold Pulse Width: 0.4 ms

## 2022-05-13 NOTE — Progress Notes (Signed)
Patient Care Team: Lauree Chandler, NP as PCP - General (Geriatric Medicine) Marygrace Drought, MD as Consulting Physician (Ophthalmology)   HPI  Alexandria Wright is a 89 y.o. female seen in followup for syncope, bradycardia, and status post pacemaker implantation in October 2008.  She underwent generator replacement 10/18.  My notes say that she received a Medtronic Adapta device which is now on advisory recall.  But on clarification today, there is no surgical recall.  The patient denies chest pain, shortness of breath, nocturnal dyspnea, orthopnea or peripheral edema.  There have been no palpitations, lightheadedness or syncope.     Date Cr K Hgb TSH  6/21     1.25  7/22 0.88 4.4 15.5   7/23 0.89 4.2 14.9      Echo 2018 Normal LV function  Past Medical History:  Diagnosis Date   Bradycardia    a. s/p MDT dual chamber PPM    Esophageal stricture    GERD (gastroesophageal reflux disease)    Skin cancer    Syncope    Tachycardia    asyptomatic nonsustained    Past Surgical History:  Procedure Laterality Date   CATARACT EXTRACTION  12/10/2011   right    CATARACT EXTRACTION  12/17/2011   left   PACEMAKER PLACEMENT  2008   MDT dual chamber PPM implanted by Dr Caryl Comes for symptomatic bradycardia   PPM GENERATOR CHANGEOUT N/A 03/04/2017   Procedure: PPM GENERATOR CHANGEOUT;  Surgeon: Deboraha Sprang, MD;  Location: [facility];  Service: Cardiovascular;  Laterality: N/A;   SKIN CANCER EXCISION     TONSILLECTOMY AND ADENOIDECTOMY      Current Outpatient Medications  Medication Sig Dispense Refill   Calcium Carbonate-Vitamin D (OSCAL 500/200 D-3 PO) Take 1 tablet by mouth daily.      Cholecalciferol (VITAMIN D3) 2000 units TABS Take 2,000 Units by mouth daily.     Misc Natural Product [facility] EYE DROPS #2 OP) Place 1 drop into both eyes daily as needed (for blurry eyes/irritation.). Complete Eye Relief     Multiple Vitamin (MULTIVITAMIN WITH MINERALS)  TABS tablet Take 1 tablet by mouth daily with breakfast.     Nutritional Supplements (ENSURE ACTIVE PO) Take 1 Bottle by mouth.     No current facility-administered medications for this visit.    Allergies  Allergen Reactions   Sulfonamide Derivatives Other (See Comments)    Made very sick, per pt    Penicillins Swelling and Rash    Has patient had a PCN reaction causing immediate rash, facial/tongue/throat swelling, SOB or lightheadedness with hypotension: Yes Has patient had a PCN reaction causing severe rash involving mucus membranes or skin necrosis: Yes Has patient had a PCN reaction that required hospitalization: No Has patient had a PCN reaction occurring within the last 10 years: No If all of the above answers are "NO", then may proceed with Cephalosporin use.       Review of Systems negative except from HPI and PMH  Physical Exam BP (!) 140/86   Pulse 94   Ht '5\' 3"'$  (1.6 m)   Wt 89 lb (40.4 kg)   SpO2 95%   BMI 15.77 kg/m  Well developed and well nourished in no acute distress HENT normal Neck supple with JVP-flat Clear Device pocket well healed; without hematoma or erythema.  There is no tethering  Regular rate and rhythm, no  gallop No  murmur Abd-soft with active BS No Clubbing cyanosis  edema Skin-warm and dry A & Oriented  Grossly normal sensory and motor function  ECG sinus at 81 Intervals 18/08/38 Right axis deviation at 98 unchanged from 10/22  Device function is normal. Programming changes   See Paceart for details        Assessment and  Plan   Sinus node dysfunction-intermittent  PVCs/VT nonsustained  Pacemaker Medtronic     Pacemaker on advisory recall-apparently an error Still with about 20% of her heartbeats faster than 100.  But continues to thrive.  Pacing infrequently.

## 2022-05-13 NOTE — Patient Instructions (Signed)
Medication Instructions:  Your physician recommends that you continue on your current medications as directed. Please refer to the Current Medication list given to you today.  *If you need a refill on your cardiac medications before your next appointment, please call your pharmacy*   Lab Work: None ordered.  If you have labs (blood work) drawn today and your tests are completely normal, you will receive your results only by: MyChart Message (if you have MyChart) OR A paper copy in the mail If you have any lab test that is abnormal or we need to change your treatment, we will call you to review the results.   Testing/Procedures: None ordered.    Follow-Up: At [facility], you and your health needs are our priority.  As part of our continuing mission to provide you with exceptional heart care, we have created designated Provider Care Teams.  These Care Teams include your primary Cardiologist (physician) and Advanced Practice Providers (APPs -  Physician Assistants and Nurse Practitioners) who all work together to provide you with the care you need, when you need it.  We recommend signing up for the patient portal called "MyChart".  Sign up information is provided on this After Visit Summary.  MyChart is used to connect with patients for Virtual Visits (Telemedicine).  Patients are able to view lab/test results, encounter notes, upcoming appointments, etc.  Non-urgent messages can be sent to your provider as well.   To learn more about what you can do with MyChart, go to [email or web address].    Your next appointment:   12 months with Dr Klein  Important Information About Sugar       

## 2022-06-23 ENCOUNTER — Ambulatory Visit: Payer: Medicare PPO

## 2022-06-23 DIAGNOSIS — I495 Sick sinus syndrome: Secondary | ICD-10-CM | POA: Diagnosis not present

## 2022-06-24 LAB — CUP PACEART REMOTE DEVICE CHECK
Battery Impedance: 662 Ohm
Battery Remaining Longevity: 81 mo
Battery Voltage: 2.77 V
Brady Statistic AP VP Percent: 0 %
Brady Statistic AP VS Percent: 6 %
Brady Statistic AS VP Percent: 0 %
Brady Statistic AS VS Percent: 94 %
Date Time Interrogation Session: 20240212090809
Implantable Lead Connection Status: 753985
Implantable Lead Connection Status: 753985
Implantable Lead Implant Date: 20080703
Implantable Lead Implant Date: 20080703
Implantable Lead Location: 753859
Implantable Lead Location: 753860
Implantable Lead Model: 5076
Implantable Lead Model: 5076
Implantable Pulse Generator Implant Date: 20181024
Lead Channel Impedance Value: 474 Ohm
Lead Channel Impedance Value: 580 Ohm
Lead Channel Pacing Threshold Amplitude: 0.5 V
Lead Channel Pacing Threshold Amplitude: 0.875 V
Lead Channel Pacing Threshold Pulse Width: 0.4 ms
Lead Channel Pacing Threshold Pulse Width: 0.4 ms
Lead Channel Setting Pacing Amplitude: 1.5 V
Lead Channel Setting Pacing Amplitude: 2.5 V
Lead Channel Setting Pacing Pulse Width: 0.4 ms
Lead Channel Setting Sensing Sensitivity: 5.6 mV
Zone Setting Status: 755011
Zone Setting Status: 755011

## 2022-08-07 NOTE — Progress Notes (Signed)
Remote pacemaker transmission.   

## 2022-09-22 ENCOUNTER — Ambulatory Visit (INDEPENDENT_AMBULATORY_CARE_PROVIDER_SITE_OTHER): Payer: Medicare PPO

## 2022-09-22 DIAGNOSIS — I495 Sick sinus syndrome: Secondary | ICD-10-CM | POA: Diagnosis not present

## 2022-09-22 LAB — CUP PACEART REMOTE DEVICE CHECK
Battery Impedance: 766 Ohm
Battery Remaining Longevity: 75 mo
Battery Voltage: 2.77 V
Brady Statistic AP VP Percent: 0 %
Brady Statistic AP VS Percent: 6 %
Brady Statistic AS VP Percent: 0 %
Brady Statistic AS VS Percent: 94 %
Date Time Interrogation Session: 20240513092102
Implantable Lead Connection Status: 753985
Implantable Lead Connection Status: 753985
Implantable Lead Implant Date: 20080703
Implantable Lead Implant Date: 20080703
Implantable Lead Location: 753859
Implantable Lead Location: 753860
Implantable Lead Model: 5076
Implantable Lead Model: 5076
Implantable Pulse Generator Implant Date: 20181024
Lead Channel Impedance Value: 509 Ohm
Lead Channel Impedance Value: 561 Ohm
Lead Channel Pacing Threshold Amplitude: 0.625 V
Lead Channel Pacing Threshold Amplitude: 0.875 V
Lead Channel Pacing Threshold Pulse Width: 0.4 ms
Lead Channel Pacing Threshold Pulse Width: 0.4 ms
Lead Channel Setting Pacing Amplitude: 1.5 V
Lead Channel Setting Pacing Amplitude: 2.5 V
Lead Channel Setting Pacing Pulse Width: 0.4 ms
Lead Channel Setting Sensing Sensitivity: 5.6 mV
Zone Setting Status: 755011
Zone Setting Status: 755011

## 2022-10-20 NOTE — Progress Notes (Signed)
Remote pacemaker transmission.   

## 2022-11-04 ENCOUNTER — Other Ambulatory Visit: Payer: Self-pay

## 2022-11-04 DIAGNOSIS — E7800 Pure hypercholesterolemia, unspecified: Secondary | ICD-10-CM

## 2022-11-04 DIAGNOSIS — M810 Age-related osteoporosis without current pathological fracture: Secondary | ICD-10-CM

## 2022-11-04 DIAGNOSIS — R636 Underweight: Secondary | ICD-10-CM

## 2022-11-17 ENCOUNTER — Other Ambulatory Visit: Payer: Medicare PPO

## 2022-11-17 ENCOUNTER — Encounter: Payer: Self-pay | Admitting: Nurse Practitioner

## 2022-11-17 ENCOUNTER — Ambulatory Visit (INDEPENDENT_AMBULATORY_CARE_PROVIDER_SITE_OTHER): Payer: Medicare PPO | Admitting: Nurse Practitioner

## 2022-11-17 VITALS — BP 139/89 | HR 100 | Temp 97.5°F | Resp 18 | Ht 63.0 in | Wt 84.4 lb

## 2022-11-17 DIAGNOSIS — Z0000 Encounter for general adult medical examination without abnormal findings: Secondary | ICD-10-CM

## 2022-11-17 DIAGNOSIS — E7800 Pure hypercholesterolemia, unspecified: Secondary | ICD-10-CM | POA: Diagnosis not present

## 2022-11-17 DIAGNOSIS — M810 Age-related osteoporosis without current pathological fracture: Secondary | ICD-10-CM | POA: Diagnosis not present

## 2022-11-17 DIAGNOSIS — R636 Underweight: Secondary | ICD-10-CM | POA: Diagnosis not present

## 2022-11-17 NOTE — Progress Notes (Signed)
Subjective:   Alexandria Wright is a 89 y.o. female who presents for Medicare Annual (Subsequent) preventive examination.  Visit Complete: In person  Patient Medicare AWV questionnaire was completed by the patient on 11/17/22; I have confirmed that all information answered by patient is correct and no changes since this date.  Review of Systems     Cardiac Risk Factors include: advanced age (>55men, >13 women)     Objective:    Today's Vitals   11/17/22 0955  BP: 139/89  Pulse: 100  Resp: 18  Temp: (!) 97.5 F (36.4 C)  SpO2: 99%  Weight: 84 lb 6.4 oz (38.3 kg)  Height: 5\' 3"  (1.6 m)   Body mass index is 14.95 kg/m.  Wt Readings from Last 3 Encounters:  11/17/22 84 lb 6.4 oz (38.3 kg)  05/13/22 89 lb (40.4 kg)  12/02/21 92 lb (41.7 kg)        11/17/2022    9:53 AM 12/02/2021    9:18 AM 11/14/2021    8:10 AM 11/19/2020   10:53 AM 11/06/2020    8:24 AM 11/10/2019    8:12 AM 11/03/2019    8:26 AM  Advanced Directives  Does Patient Have a Medical Advance Directive? Yes Yes Yes Yes Yes Yes Yes  Type of Advance Directive Living will;Out of facility DNR (pink MOST or yellow form) Healthcare Power of [place];Living will;Out of facility DNR (pink MOST or yellow form) Healthcare Power of [place];Living will;Out of facility DNR (pink MOST or yellow form) Healthcare Power of [place];Living will Healthcare Power of [place];Living will Healthcare Power of [place];Living will;Out of facility DNR (pink MOST or yellow form) Living will;Out of facility DNR (pink MOST or yellow form)  Does patient want to make changes to medical advance directive? No - Patient declined No - Patient declined No - Patient declined No - Patient declined No - Patient declined No - Guardian declined No - Patient declined  Copy of Healthcare Power of Attorney in Chart?  Yes - validated most recent copy scanned in chart (See row information) Yes - validated most recent copy scanned in chart (See row information)  Yes - validated most recent copy scanned in chart (See row information) Yes - validated most recent copy scanned in chart (See row information) Yes - validated most recent copy scanned in chart (See row information)   Pre-existing out of facility DNR order (yellow form or pink MOST form) Yellow form placed in chart (order not valid for inpatient use) Yellow form placed in chart (order not valid for inpatient use) Yellow form placed in chart (order not valid for inpatient use);Pink MOST form placed in chart (order not valid for inpatient use)   Pink MOST/Yellow Form most recent copy in chart - Physician notified to receive inpatient order Yellow form placed in chart (order not valid for inpatient use)    Current Medications (verified) Outpatient Encounter Medications as of 11/17/2022  Medication Sig   Calcium Carbonate-Vitamin D (OSCAL 500/200 D-3 PO) Take 1 tablet by mouth daily.    Cholecalciferol (VITAMIN D3) 2000 units TABS Take 2,000 Units by mouth daily.   Misc Natural Product [facility] EYE DROPS #2 OP) Place 1 drop into both eyes daily as needed (for blurry eyes/irritation.). Complete Eye Relief   Multiple Vitamin (MULTIVITAMIN WITH MINERALS) TABS tablet Take 1 tablet by mouth daily with breakfast.   Nutritional Supplements (ENSURE ACTIVE PO) Take 1 Bottle by mouth.   No facility-administered encounter medications on file as of 11/17/2022.  Allergies (verified) Sulfonamide derivatives and Penicillins   History: Past Medical History:  Diagnosis Date   Bradycardia    a. s/p MDT dual chamber PPM    Esophageal stricture    GERD (gastroesophageal reflux disease)    Skin cancer    Syncope    Tachycardia    asyptomatic nonsustained   Past Surgical History:  Procedure Laterality Date   CATARACT EXTRACTION  12/10/2011   right    CATARACT EXTRACTION  12/17/2011   left   PACEMAKER PLACEMENT  12-20-06   MDT dual chamber PPM implanted by Dr Graciela Husbands for symptomatic bradycardia   PPM GENERATOR  CHANGEOUT N/A 03/04/2017   Procedure: PPM GENERATOR CHANGEOUT;  Surgeon: Duke Salvia, MD;  Location: [facility] INVASIVE CV LAB;  Service: Cardiovascular;  Laterality: N/A;   SKIN CANCER EXCISION     TONSILLECTOMY AND ADENOIDECTOMY     Family History  Problem Relation Age of Onset   Heart disease Mother    Colon cancer Neg Hx    Social History   Socioeconomic History   Marital status: Widowed    Spouse name: Not on file   Number of children: 0   Years of education: Not on file   Highest education level: Not on file  Occupational History   Occupation: retired Runner, broadcasting/film/video  Tobacco Use   Smoking status: Never   Smokeless tobacco: Never  Vaping Use   Vaping Use: Never used  Substance and Sexual Activity   Alcohol use: No   Drug use: No   Sexual activity: Not on file  Other Topics Concern   Not on file  Social History Narrative   Widow -husband died 2003-12-20   Lives alone   Never smoked   Alcohol none   Walks 8 miles daily 5 days a week    Living Will   Social Determinants of Health   Financial Resource Strain: Low Risk  (09/21/2017)   Overall Financial Resource Strain (CARDIA)    Difficulty of Paying Living Expenses: Not hard at all  Food Insecurity: No Food Insecurity (09/21/2017)   Hunger Vital Sign    Worried About Running Out of Food in the Last Year: Never true    Ran Out of Food in the Last Year: Never true  Transportation Needs: No Transportation Needs (09/21/2017)   PRAPARE - Administrator, Civil Service (Medical): No    Lack of Transportation (Non-Medical): No  Physical Activity: Sufficiently Active (09/21/2017)   Exercise Vital Sign    Days of Exercise per Week: 7 days    Minutes of Exercise per Session: 150+ min  Stress: No Stress Concern Present (09/21/2017)   [employer] of Occupational Health - Occupational Stress Questionnaire    Feeling of Stress : Not at all  Social Connections: Somewhat Isolated (09/21/2017)   Social Connection and Isolation  Panel [NHANES]    Frequency of Communication with Friends and Family: More than three times a week    Frequency of Social Gatherings with Friends and Family: More than three times a week    Attends Religious Services: More than 4 times per year    Active Member of [employer] or Organizations: No    Attends Banker Meetings: Never    Marital Status: Widowed    Tobacco Counseling Counseling given: Not Answered   Clinical Intake:  Pre-visit preparation completed: Yes  Pain : No/denies pain     BMI - recorded: 14 Nutritional Status: BMI <19  Underweight Nutritional Risks: Unintentional  weight loss Diabetes: No  How often do you need to have someone help you when you read instructions, pamphlets, or other written materials from your doctor or pharmacy?: 1 - Never         Activities of Daily Living    11/17/2022   10:25 AM  In your present state of health, do you have any difficulty performing the following activities:  Hearing? 1  Vision? 0  Difficulty concentrating or making decisions? 0  Walking or climbing stairs? 0  Dressing or bathing? 0  Doing errands, shopping? 0  Preparing Food and eating ? N  Using the Toilet? N  In the past six months, have you accidently leaked urine? N  Do you have problems with loss of bowel control? N  Managing your Medications? N  Managing your Finances? N  Housekeeping or managing your Housekeeping? N    Patient Care Team: Sharon Seller, NP as PCP - General (Geriatric Medicine) Janet Berlin, MD as Consulting Physician (Ophthalmology)  Indicate any recent Medical Services you may have received from other than [facility] providers in the past year (date may be approximate).     Assessment:   This is a routine wellness examination for Amylynn.  Hearing/Vision screen No results found.  Dietary issues and exercise activities discussed:     Goals Addressed   None    Depression Screen    11/17/2022    9:54 AM  12/02/2021    9:41 AM 11/14/2021    8:11 AM 11/06/2020    8:20 AM 11/10/2019    8:12 AM 11/03/2019    8:35 AM 11/02/2018    9:00 AM  PHQ 2/9 Scores  PHQ - 2 Score 0 0 0 0 0 0 0  Exception Documentation Other- indicate reason in comment box        Not completed AWV          Fall Risk    11/17/2022    9:54 AM 12/02/2021    9:40 AM 11/14/2021    8:10 AM 11/19/2020   10:52 AM 11/06/2020    8:22 AM  Fall Risk   Falls in the past year? 0 0 0 0 0  Number falls in past yr: 0 0 0 0 0  Injury with Fall? 0 0 0 0 0  Risk for fall due to : No Fall Risks No Fall Risks No Fall Risks No Fall Risks   Follow up Falls evaluation completed Falls evaluation completed Falls evaluation completed Falls evaluation completed     MEDICARE RISK AT HOME:   TIMED UP AND GO:  Was the test performed?  No    Cognitive Function:    11/02/2018    9:00 AM 09/21/2017    1:07 PM 09/01/2016    8:53 AM 08/31/2015    9:02 AM 08/25/2014    9:01 AM  MMSE - Mini Mental State Exam  Orientation to time 5 5 5 5 5   Orientation to Place 5 5 5 5 5   Registration 3 3 3 3 3   Attention/ Calculation 5 5 5 5 5   Recall 3 2 3 3 2   Language- name 2 objects 2 2 2 2 2   Language- repeat 1 1 1 1 1   Language- follow 3 step command 3 3 3 3 3   Language- read & follow direction 1 1 1 1 1   Write a sentence 1 1 1 1 1   Copy design 1 1 1 1 1   Total score 30 29 30  30 29        11/14/2021    9:39 AM 11/06/2020    8:24 AM 11/03/2019    8:37 AM  6CIT Screen  What Year? 0 points 0 points 0 points  What month? 0 points 0 points 0 points  What time? 0 points 0 points 0 points  Count back from 20 0 points 0 points 0 points  Months in reverse 0 points 2 points 0 points  Repeat phrase 0 points 0 points   Total Score 0 points 2 points     Immunizations Immunization History  Administered Date(s) Administered   Influenza, High Dose Seasonal PF 01/29/2017, 02/04/2018, 02/24/2019, 03/07/2020   Influenza-Unspecified 01/25/2013, 02/05/2016    PFIZER(Purple Top)SARS-COV-2 Vaccination 06/16/2019, 07/13/2019, 02/13/2020, 09/03/2020   Pfizer Covid-19 Vaccine Bivalent Booster 67yrs & up 03/07/2021, 11/14/2021   Pneumococcal Conjugate-13 08/25/2014, 02/05/2016   Pneumococcal Polysaccharide-23 08/26/2011   Td 01/05/2020   Tdap 07/11/2011, 01/05/2020   Zoster Recombinant(Shingrix) 09/04/2016, 12/04/2016   Zoster, Live 08/23/2013    TDAP status: Up to date  Flu Vaccine status: Up to date  Pneumococcal vaccine status: Up to date  Covid-19 vaccine status: Information provided on how to obtain vaccines.   Qualifies for Shingles Vaccine? Yes   Zostavax completed No   Shingrix Completed?: Yes  Screening Tests Health Maintenance  Topic Date Due   COVID-19 Vaccine (7 - 2023-24 season) 01/10/2022   INFLUENZA VACCINE  12/11/2022   Medicare Annual Wellness (AWV)  11/17/2023   DTaP/Tdap/Td (4 - Td or Tdap) 01/04/2030   Pneumonia Vaccine 56+ Years old  Completed   DEXA SCAN  Completed   Zoster Vaccines- Shingrix  Completed   HPV VACCINES  Aged Out    Health Maintenance  Health Maintenance Due  Topic Date Due   COVID-19 Vaccine (7 - 2023-24 season) 01/10/2022    Colorectal cancer screening: No longer required.   Mammogram status: No longer required due to age.  Declines bone density  Lung Cancer Screening: (Low Dose CT Chest recommended if Age 56-80 years, 20 pack-year currently smoking OR have quit w/in 15years.) does not qualify.   Lung Cancer Screening Referral: na  Additional Screening:  Hepatitis C Screening: does not qualify; Completed   Vision Screening: Recommended annual ophthalmology exams for early detection of glaucoma and other disorders of the eye. Is the patient up to date with their annual eye exam?  Yes  Who is the provider or what is the name of the office in which the patient attends annual eye exams? [facility] If pt is not established with a provider, would they like to be referred to a provider to  establish care? No .   Dental Screening: Recommended annual dental exams for proper oral hygiene    Community Resource Referral / Chronic Care Management: CRR required this visit?  No   CCM required this visit?  No     Plan:     I have personally reviewed and noted the following in the patient's chart:   Medical and social history Use of alcohol, tobacco or illicit drugs  Current medications and supplements including opioid prescriptions. Patient is not currently taking opioid prescriptions. Functional ability and status Nutritional status Physical activity Advanced directives List of other physicians Hospitalizations, surgeries, and ER visits in previous 12 months Vitals Screenings to include cognitive, depression, and falls Referrals and appointments  In addition, I have reviewed and discussed with patient certain preventive protocols, quality metrics, and best practice recommendations. A written personalized care  plan for preventive services as well as general preventive health recommendations were provided to patient.     Sharon Seller, NP   11/17/2022   Place of service: [facility]

## 2022-11-17 NOTE — Patient Instructions (Signed)
  Alexandria Wright , Thank you for taking time to come for your Medicare Wellness Visit. I appreciate your ongoing commitment to your health goals. Please review the following plan we discussed and let me know if I can assist you in the future.   These are the goals we discussed:  Goals      Patient Stated     Maintain current lifestyle        This is a list of the screening recommended for you and due dates:  Health Maintenance  Topic Date Due   COVID-19 Vaccine (7 - 2023-24 season) 01/10/2022   Flu Shot  12/11/2022   Medicare Annual Wellness Visit  11/17/2023   DTaP/Tdap/Td vaccine (4 - Td or Tdap) 01/04/2030   Pneumonia Vaccine  Completed   DEXA scan (bone density measurement)  Completed   Zoster (Shingles) Vaccine  Completed   HPV Vaccine  Aged Out

## 2022-11-18 LAB — CBC WITH DIFFERENTIAL/PLATELET
Absolute Monocytes: 816 cells/uL (ref 200–950)
Basophils Absolute: 58 cells/uL (ref 0–200)
Basophils Relative: 0.6 %
Eosinophils Absolute: 86 cells/uL (ref 15–500)
Eosinophils Relative: 0.9 %
HCT: 43.6 % (ref 35.0–45.0)
Hemoglobin: 14.6 g/dL (ref 11.7–15.5)
Lymphs Abs: 912 cells/uL (ref 850–3900)
MCH: 32.5 pg (ref 27.0–33.0)
MCHC: 33.5 g/dL (ref 32.0–36.0)
MCV: 97.1 fL (ref 80.0–100.0)
MPV: 10.1 fL (ref 7.5–12.5)
Monocytes Relative: 8.5 %
Neutro Abs: 7728 cells/uL (ref 1500–7800)
Neutrophils Relative %: 80.5 %
Platelets: 249 10*3/uL (ref 140–400)
RBC: 4.49 10*6/uL (ref 3.80–5.10)
RDW: 12.5 % (ref 11.0–15.0)
Total Lymphocyte: 9.5 %
WBC: 9.6 10*3/uL (ref 3.8–10.8)

## 2022-11-18 LAB — COMPLETE METABOLIC PANEL WITH GFR
AG Ratio: 1.6 (calc) (ref 1.0–2.5)
ALT: 8 U/L (ref 6–29)
AST: 14 U/L (ref 10–35)
Albumin: 4.1 g/dL (ref 3.6–5.1)
Alkaline phosphatase (APISO): 67 U/L (ref 37–153)
BUN: 17 mg/dL (ref 7–25)
CO2: 32 mmol/L (ref 20–32)
Calcium: 9.5 mg/dL (ref 8.6–10.4)
Chloride: 99 mmol/L (ref 98–110)
Creat: 0.83 mg/dL (ref 0.60–0.95)
Globulin: 2.6 g/dL (calc) (ref 1.9–3.7)
Glucose, Bld: 101 mg/dL — ABNORMAL HIGH (ref 65–99)
Potassium: 4.2 mmol/L (ref 3.5–5.3)
Sodium: 138 mmol/L (ref 135–146)
Total Bilirubin: 0.6 mg/dL (ref 0.2–1.2)
Total Protein: 6.7 g/dL (ref 6.1–8.1)
eGFR: 67 mL/min/{1.73_m2} (ref >=60)

## 2022-11-18 LAB — LIPID PANEL
Cholesterol: 189 mg/dL (ref <200)
HDL: 64 mg/dL (ref >=50)
LDL Cholesterol (Calc): 104 mg/dL (calc) — ABNORMAL HIGH
Non-HDL Cholesterol (Calc): 125 mg/dL (calc) (ref <130)
Total CHOL/HDL Ratio: 3 (calc) (ref <5.0)
Triglycerides: 116 mg/dL (ref <150)

## 2022-12-01 ENCOUNTER — Other Ambulatory Visit: Payer: Medicare PPO

## 2022-12-08 ENCOUNTER — Ambulatory Visit: Payer: Medicare PPO | Admitting: Nurse Practitioner

## 2022-12-08 ENCOUNTER — Encounter: Payer: Self-pay | Admitting: Nurse Practitioner

## 2022-12-08 VITALS — BP 124/78 | HR 100 | Temp 97.5°F | Ht 63.0 in | Wt 81.8 lb

## 2022-12-08 DIAGNOSIS — Z950 Presence of cardiac pacemaker: Secondary | ICD-10-CM

## 2022-12-08 DIAGNOSIS — R636 Underweight: Secondary | ICD-10-CM

## 2022-12-08 DIAGNOSIS — I495 Sick sinus syndrome: Secondary | ICD-10-CM | POA: Diagnosis not present

## 2022-12-08 DIAGNOSIS — M810 Age-related osteoporosis without current pathological fracture: Secondary | ICD-10-CM

## 2022-12-08 DIAGNOSIS — E7800 Pure hypercholesterolemia, unspecified: Secondary | ICD-10-CM

## 2022-12-08 DIAGNOSIS — H6123 Impacted cerumen, bilateral: Secondary | ICD-10-CM | POA: Diagnosis not present

## 2022-12-08 NOTE — Progress Notes (Signed)
Careteam: Patient Care Team: Sharon Seller, NP as PCP - General (Geriatric Medicine) Janet Berlin, MD as Consulting Physician (Ophthalmology)  PLACE OF SERVICE:  [facility]  Advanced Directive information Does Patient Have a Medical Advance Directive?: Yes, Type of Advance Directive: Living will;Out of facility DNR (pink MOST or yellow form), Pre-existing out of facility DNR order (yellow form or pink MOST form): Yellow form placed in chart (order not valid for inpatient use), Does patient want to make changes to medical advance directive?: No - Patient declined  Allergies  Allergen Reactions   Sulfonamide Derivatives Other (See Comments)    Made very sick, per pt    Penicillins Swelling and Rash    Has patient had a PCN reaction causing immediate rash, facial/tongue/throat swelling, SOB or lightheadedness with hypotension: Yes Has patient had a PCN reaction causing severe rash involving mucus membranes or skin necrosis: Yes Has patient had a PCN reaction that required hospitalization: No Has patient had a PCN reaction occurring within the last 10 years: No If all of the above answers are "NO", then may proceed with Cephalosporin use.     Chief Complaint  Patient presents with   Follow-up    Follow-up to discuss recent labs (copy printed and given to patient)      HPI: Patient is a 89 y.o. female for routine follow up.  She walks 5 days a week 5-6 miles a day.  Continues to take cal and vit d  Also works out in the yard.   LDL cholesterol at goal.   She has lost weight since last visit. Reports she continues to eat when she is hungry. Drinks ensure daily and eats 2 meals a day.  Breakfast and late lunch.   Only wants to live as long as her mind is good.    Review of Systems:  Review of Systems  Constitutional:  Negative for chills, fever and weight loss.  HENT:  Negative for tinnitus.   Respiratory:  Negative for cough, sputum production and shortness of  breath.   Cardiovascular:  Negative for chest pain, palpitations and leg swelling.  Gastrointestinal:  Negative for abdominal pain, constipation, diarrhea and heartburn.  Genitourinary:  Negative for dysuria, frequency and urgency.  Musculoskeletal:  Negative for back pain, falls, joint pain and myalgias.  Skin: Negative.   Neurological:  Negative for dizziness and headaches.  Psychiatric/Behavioral:  Negative for depression and memory loss. The patient does not have insomnia.     Past Medical History:  Diagnosis Date   Bradycardia    a. s/p MDT dual chamber PPM    Esophageal stricture    GERD (gastroesophageal reflux disease)    Skin cancer    Syncope    Tachycardia    asyptomatic nonsustained   Past Surgical History:  Procedure Laterality Date   CATARACT EXTRACTION  12/10/2011   right    CATARACT EXTRACTION  12/17/2011   left   PACEMAKER PLACEMENT  2008   MDT dual chamber PPM implanted by Dr Graciela Husbands for symptomatic bradycardia   PPM GENERATOR CHANGEOUT N/A 03/04/2017   Procedure: PPM GENERATOR CHANGEOUT;  Surgeon: Duke Salvia, MD;  [facility]: [facility] INVASIVE CV LAB;  Service: Cardiovascular;  Laterality: N/A;   SKIN CANCER EXCISION     TONSILLECTOMY AND ADENOIDECTOMY     Social History:   reports that she has never smoked. She has never used smokeless tobacco. She reports that she does not drink alcohol and does not use drugs.  Family History  Problem Relation Age of Onset   Heart disease Mother    Colon cancer Neg Hx     Medications: Patient's Medications  New Prescriptions   No medications on file  Previous Medications   CALCIUM CARBONATE-VITAMIN D (OSCAL 500/200 D-3 PO)    Take 1 tablet by mouth daily.    CHOLECALCIFEROL (VITAMIN D3) 2000 UNITS TABS    Take 2,000 Units by mouth daily.   MISC NATURAL PRODUCT (SIMILASAN EYE DROPS #2 OP)    Place 1 drop into both eyes daily as needed (for blurry eyes/irritation.). Complete Eye Relief   MULTIPLE VITAMIN (MULTIVITAMIN  WITH MINERALS) TABS TABLET    Take 1 tablet by mouth daily with breakfast.   NUTRITIONAL SUPPLEMENTS (ENSURE ACTIVE PO)    Take 1 Bottle by mouth.  Modified Medications   No medications on file  Discontinued Medications   No medications on file    Physical Exam:  Vitals:   12/08/22 0814  BP: 124/78  Pulse: 100  Temp: (!) 97.5 F (36.4 C)  TempSrc: Temporal  SpO2: 95%  Weight: 81 lb 12.8 oz (37.1 kg)  Height: 5\' 3"  (1.6 m)   Body mass index is 14.49 kg/m. Wt Readings from Last 3 Encounters:  12/08/22 81 lb 12.8 oz (37.1 kg)  11/17/22 84 lb 6.4 oz (38.3 kg)  05/13/22 89 lb (40.4 kg)    Physical Exam Constitutional:      General: She is not in acute distress.    Appearance: She is well-developed. She is not diaphoretic.  HENT:     Head: Normocephalic and atraumatic.     Mouth/Throat:     Pharynx: No oropharyngeal exudate.  Eyes:     Conjunctiva/sclera: Conjunctivae normal.     Pupils: Pupils are equal, round, and reactive to light.  Cardiovascular:     Rate and Rhythm: Normal rate and regular rhythm.     Heart sounds: Normal heart sounds.  Pulmonary:     Effort: Pulmonary effort is normal.     Breath sounds: Normal breath sounds.  Abdominal:     General: Bowel sounds are normal.     Palpations: Abdomen is soft.  Musculoskeletal:     Cervical back: Normal range of motion and neck supple.     Right lower leg: No edema.     Left lower leg: No edema.  Skin:    General: Skin is warm and dry.  Neurological:     Mental Status: She is alert.  Psychiatric:        Mood and Affect: Mood normal.     Labs reviewed: Basic Metabolic Panel: Recent Labs    11/17/22 0936  NA 138  K 4.2  CL 99  CO2 32  GLUCOSE 101*  BUN 17  CREATININE 0.83  CALCIUM 9.5   Liver Function Tests: Recent Labs    11/17/22 0936  AST 14  ALT 8  BILITOT 0.6  PROT 6.7   No results for input(s): "LIPASE", "AMYLASE" in the last 8760 hours. No results for input(s): "AMMONIA" in the  last 8760 hours. CBC: Recent Labs    11/17/22 0936  WBC 9.6  NEUTROABS 7,728  HGB 14.6  HCT 43.6  MCV 97.1  PLT 249   Lipid Panel: Recent Labs    11/17/22 0936  CHOL 189  HDL 64  LDLCALC 104*  TRIG 116  CHOLHDL 3.0   TSH: No results for input(s): "TSH" in the last 8760 hours. A1C: Lab Results  Component  Value Date   HGBA1C 5.2 11/13/2020     Assessment/Plan 1. Pure hypercholesterolemia -at goal on last labs.  - CBC with Differential/Platelet; Future - COMPLETE METABOLIC PANEL WITH GFR; Future - Lipid panel; Future  2. Underweight -encouraged to eat 3 meals a day, protein supplement daily recommended.   3. Senile osteoporosis -continues on vit d supplement - COMPLETE METABOLIC PANEL WITH GFR; Future  4. Bilateral impacted cerumen -lavage completed. Pt tolerated well.   5. Tachy-brady syndrome (HCC) -stable, s/p pacemaker, no chest pains or palpitations Followed by cardiology  6. S/P placement of cardiac pacemaker -continues to follow up with cardiology   Return in about 1 year (around 12/08/2023) for yearly follow up, labs prior .  Janene Harvey. [facility]ag[facility] [phone]

## 2022-12-08 NOTE — Patient Instructions (Signed)
To increase intake- make sure you eat 3 meals a day.

## 2022-12-22 ENCOUNTER — Ambulatory Visit: Payer: Medicare PPO

## 2022-12-22 DIAGNOSIS — I495 Sick sinus syndrome: Secondary | ICD-10-CM | POA: Diagnosis not present

## 2023-01-06 NOTE — Progress Notes (Signed)
Remote pacemaker transmission.   

## 2023-03-23 ENCOUNTER — Ambulatory Visit (INDEPENDENT_AMBULATORY_CARE_PROVIDER_SITE_OTHER): Payer: Medicare PPO

## 2023-03-23 ENCOUNTER — Telehealth: Payer: Self-pay | Admitting: Internal Medicine

## 2023-03-23 DIAGNOSIS — I495 Sick sinus syndrome: Secondary | ICD-10-CM | POA: Diagnosis not present

## 2023-03-23 NOTE — Telephone Encounter (Signed)
°  1. Has your device fired? no ° °2. Is you device beeping? no ° °3. Are you experiencing draining or swelling at device site?no ° °4. Are you calling to see if we received your device transmission?yes ° °5. Have you passed out? no ° ° ° °Please route to Device Clinic Pool °

## 2023-03-24 LAB — CUP PACEART REMOTE DEVICE CHECK
Battery Impedance: 901 Ohm
Battery Remaining Longevity: 69 mo
Battery Voltage: 2.77 V
Brady Statistic AP VP Percent: 0 %
Brady Statistic AP VS Percent: 4 %
Brady Statistic AS VP Percent: 0 %
Brady Statistic AS VS Percent: 96 %
Date Time Interrogation Session: 20241111090618
Implantable Lead Connection Status: 753985
Implantable Lead Connection Status: 753985
Implantable Lead Implant Date: 20080703
Implantable Lead Implant Date: 20080703
Implantable Lead Location: 753859
Implantable Lead Location: 753860
Implantable Lead Model: 5076
Implantable Lead Model: 5076
Implantable Pulse Generator Implant Date: 20181024
Lead Channel Impedance Value: 510 Ohm
Lead Channel Impedance Value: 539 Ohm
Lead Channel Pacing Threshold Amplitude: 0.75 V
Lead Channel Pacing Threshold Amplitude: 0.75 V
Lead Channel Pacing Threshold Pulse Width: 0.4 ms
Lead Channel Pacing Threshold Pulse Width: 0.4 ms
Lead Channel Setting Pacing Amplitude: 1.5 V
Lead Channel Setting Pacing Amplitude: 2.5 V
Lead Channel Setting Pacing Pulse Width: 0.52 ms
Lead Channel Setting Sensing Sensitivity: 5.6 mV
Zone Setting Status: 755011
Zone Setting Status: 755011

## 2023-04-01 NOTE — Telephone Encounter (Signed)
Pt transmission was received

## 2023-04-17 NOTE — Progress Notes (Signed)
Remote pacemaker transmission.   

## 2023-06-01 ENCOUNTER — Emergency Department (HOSPITAL_COMMUNITY): Payer: Medicare PPO

## 2023-06-01 ENCOUNTER — Encounter (HOSPITAL_COMMUNITY): Payer: Self-pay

## 2023-06-01 ENCOUNTER — Inpatient Hospital Stay (HOSPITAL_COMMUNITY)
Admission: EM | Admit: 2023-06-01 | Discharge: 2023-06-04 | DRG: 064 | Disposition: A | Discharge status: Home Health | Payer: Medicare PPO | Attending: Internal Medicine | Admitting: Internal Medicine

## 2023-06-01 ENCOUNTER — Other Ambulatory Visit: Payer: Self-pay

## 2023-06-01 DIAGNOSIS — R4701 Aphasia: Secondary | ICD-10-CM | POA: Diagnosis present

## 2023-06-01 DIAGNOSIS — R29704 NIHSS score 4: Secondary | ICD-10-CM | POA: Diagnosis present

## 2023-06-01 DIAGNOSIS — R54 Age-related physical debility: Secondary | ICD-10-CM | POA: Diagnosis present

## 2023-06-01 DIAGNOSIS — Z7902 Long term (current) use of antithrombotics/antiplatelets: Secondary | ICD-10-CM

## 2023-06-01 DIAGNOSIS — Z882 Allergy status to sulfonamides status: Secondary | ICD-10-CM | POA: Diagnosis not present

## 2023-06-01 DIAGNOSIS — I639 Cerebral infarction, unspecified: Secondary | ICD-10-CM | POA: Diagnosis present

## 2023-06-01 DIAGNOSIS — Z8249 Family history of ischemic heart disease and other diseases of the circulatory system: Secondary | ICD-10-CM

## 2023-06-01 DIAGNOSIS — R29810 Facial weakness: Secondary | ICD-10-CM | POA: Diagnosis present

## 2023-06-01 DIAGNOSIS — Z85828 Personal history of other malignant neoplasm of skin: Secondary | ICD-10-CM

## 2023-06-01 DIAGNOSIS — R1310 Dysphagia, unspecified: Secondary | ICD-10-CM | POA: Diagnosis present

## 2023-06-01 DIAGNOSIS — Z79899 Other long term (current) drug therapy: Secondary | ICD-10-CM | POA: Diagnosis not present

## 2023-06-01 DIAGNOSIS — J189 Pneumonia, unspecified organism: Secondary | ICD-10-CM | POA: Diagnosis present

## 2023-06-01 DIAGNOSIS — I495 Sick sinus syndrome: Secondary | ICD-10-CM | POA: Diagnosis present

## 2023-06-01 DIAGNOSIS — I6389 Other cerebral infarction: Secondary | ICD-10-CM | POA: Diagnosis not present

## 2023-06-01 DIAGNOSIS — K219 Gastro-esophageal reflux disease without esophagitis: Secondary | ICD-10-CM | POA: Diagnosis present

## 2023-06-01 DIAGNOSIS — I700 Atherosclerosis of aorta: Secondary | ICD-10-CM | POA: Diagnosis not present

## 2023-06-01 DIAGNOSIS — Z681 Body mass index (BMI) 19 or less, adult: Secondary | ICD-10-CM

## 2023-06-01 DIAGNOSIS — R0902 Hypoxemia: Secondary | ICD-10-CM | POA: Diagnosis not present

## 2023-06-01 DIAGNOSIS — R0689 Other abnormalities of breathing: Secondary | ICD-10-CM | POA: Diagnosis not present

## 2023-06-01 DIAGNOSIS — Z66 Do not resuscitate: Secondary | ICD-10-CM | POA: Diagnosis present

## 2023-06-01 DIAGNOSIS — Z515 Encounter for palliative care: Secondary | ICD-10-CM | POA: Diagnosis not present

## 2023-06-01 DIAGNOSIS — R918 Other nonspecific abnormal finding of lung field: Secondary | ICD-10-CM | POA: Diagnosis not present

## 2023-06-01 DIAGNOSIS — I63512 Cerebral infarction due to unspecified occlusion or stenosis of left middle cerebral artery: Principal | ICD-10-CM

## 2023-06-01 DIAGNOSIS — G930 Cerebral cysts: Secondary | ICD-10-CM | POA: Diagnosis not present

## 2023-06-01 DIAGNOSIS — E7800 Pure hypercholesterolemia, unspecified: Secondary | ICD-10-CM | POA: Diagnosis present

## 2023-06-01 DIAGNOSIS — R4781 Slurred speech: Secondary | ICD-10-CM | POA: Diagnosis not present

## 2023-06-01 DIAGNOSIS — Z7982 Long term (current) use of aspirin: Secondary | ICD-10-CM

## 2023-06-01 DIAGNOSIS — Z950 Presence of cardiac pacemaker: Secondary | ICD-10-CM

## 2023-06-01 DIAGNOSIS — R64 Cachexia: Secondary | ICD-10-CM | POA: Diagnosis present

## 2023-06-01 DIAGNOSIS — M810 Age-related osteoporosis without current pathological fracture: Secondary | ICD-10-CM | POA: Diagnosis present

## 2023-06-01 DIAGNOSIS — I10 Essential (primary) hypertension: Secondary | ICD-10-CM | POA: Diagnosis not present

## 2023-06-01 DIAGNOSIS — I6523 Occlusion and stenosis of bilateral carotid arteries: Secondary | ICD-10-CM | POA: Diagnosis not present

## 2023-06-01 DIAGNOSIS — Z880 Allergy status to penicillin: Secondary | ICD-10-CM

## 2023-06-01 DIAGNOSIS — R531 Weakness: Secondary | ICD-10-CM | POA: Diagnosis not present

## 2023-06-01 DIAGNOSIS — R000 Tachycardia, unspecified: Secondary | ICD-10-CM | POA: Diagnosis not present

## 2023-06-01 DIAGNOSIS — J984 Other disorders of lung: Secondary | ICD-10-CM | POA: Diagnosis not present

## 2023-06-01 DIAGNOSIS — R9082 White matter disease, unspecified: Secondary | ICD-10-CM | POA: Diagnosis not present

## 2023-06-01 LAB — LIPID PANEL
Cholesterol: 206 mg/dL — ABNORMAL HIGH (ref 0–200)
HDL: 75 mg/dL (ref >40)
LDL Cholesterol: 114 mg/dL — ABNORMAL HIGH (ref 0–99)
Total CHOL/HDL Ratio: 2.7 {ratio}
Triglycerides: 86 mg/dL (ref <150)
VLDL: 17 mg/dL (ref 0–40)

## 2023-06-01 LAB — CBC
HCT: 46.8 % — ABNORMAL HIGH (ref 36.0–46.0)
Hemoglobin: 14.8 g/dL (ref 12.0–15.0)
MCH: 32.8 pg (ref 26.0–34.0)
MCHC: 31.6 g/dL (ref 30.0–36.0)
MCV: 103.8 fL — ABNORMAL HIGH (ref 80.0–100.0)
Platelets: 214 10*3/uL (ref 150–400)
RBC: 4.51 MIL/uL (ref 3.87–5.11)
RDW: 12.9 % (ref 11.5–15.5)
WBC: 12.4 10*3/uL — ABNORMAL HIGH (ref 4.0–10.5)
nRBC: 0 % (ref 0.0–0.2)

## 2023-06-01 LAB — BASIC METABOLIC PANEL
Anion gap: 14 (ref 5–15)
BUN: 21 mg/dL (ref 8–23)
CO2: 22 mmol/L (ref 22–32)
Calcium: 9.2 mg/dL (ref 8.9–10.3)
Chloride: 98 mmol/L (ref 98–111)
Creatinine, Ser: 1.05 mg/dL — ABNORMAL HIGH (ref 0.44–1.00)
GFR, Estimated: 50 mL/min — ABNORMAL LOW (ref >60)
Glucose, Bld: 177 mg/dL — ABNORMAL HIGH (ref 70–99)
Potassium: 4.3 mmol/L (ref 3.5–5.1)
Sodium: 134 mmol/L — ABNORMAL LOW (ref 135–145)

## 2023-06-01 MED ORDER — ACETAMINOPHEN 650 MG RE SUPP: 650.0000 mg | RECTAL | Status: DC | PRN

## 2023-06-01 MED ORDER — CLOPIDOGREL BISULFATE 75 MG PO TABS: 75.0000 mg | ORAL_TABLET | Freq: Every day | ORAL | Status: DC

## 2023-06-01 MED ORDER — ACETAMINOPHEN 325 MG PO TABS: 650.0000 mg | ORAL_TABLET | ORAL | Status: DC | PRN

## 2023-06-01 MED ORDER — ASPIRIN 81 MG PO TBEC
81.0000 mg | DELAYED_RELEASE_TABLET | Freq: Every day | ORAL | Status: DC
Start: 2023-06-02 — End: 2023-06-02

## 2023-06-01 MED ORDER — STROKE: EARLY STAGES OF RECOVERY BOOK
Freq: Once | Status: AC
Start: 2023-06-02 — End: 2023-06-02
  Filled 2023-06-01: qty 1

## 2023-06-01 MED ORDER — ACETAMINOPHEN 160 MG/5ML PO SOLN: 650.0000 mg | ORAL | Status: DC | PRN

## 2023-06-01 MED ORDER — ENOXAPARIN SODIUM 300 MG/3ML IJ SOLN
20.0000 mg | INTRAMUSCULAR | Status: DC
  Administered 2023-06-02 – 2023-06-03 (×2): 20 mg via SUBCUTANEOUS
  Filled 2023-06-01 (×3): qty 0.2

## 2023-06-01 NOTE — ED Provider Notes (Signed)
[facility] Provider Note   CSN: [id number] Arrival date & time: 06/01/23  1815     History  Chief Complaint  Patient presents with   Aphasia    Alexandria Wright is a 89 y.o. female with history of elevated cholesterol, osteoporosis, tachybradycardia syndrome status post pacemaker, presenting to the ED with concern for difficulty with speech.  The patient's grandniece is present at the bedside, who reports that a family member last spoke to the patient yesterday, which send the patient speech appeared thick and funny.  2 days ago, 48 hours ago, they spoke on the phone with the patient and she was normal.  Today when the niece attempted to call the patient she was having difficulty getting her words out.  EMS arrived on scene and confirmed similar symptoms.  The patient is able to understand everything that is said to her and nod and speak appropriately yes no, but cannot complete a sentence.  She denies weakness of arms or legs.  She denies prior history of stroke.  She denies headache to me.  She says she was feeling well otherwise.  EMS reported "strong smelling urine" on scene".  HPI     Home Medications Prior to Admission medications   Medication Sig Start Date End Date Taking? Authorizing Provider  Calcium Carbonate-Vitamin D (OSCAL 500/200 D-3 PO) Take 1 tablet by mouth daily with breakfast.   Yes [provider]  Cholecalciferol (VITAMIN D3) 50 MCG (2000 UT) TABS Take 2,000 Units by mouth daily.   Yes [provider]  Homeopathic Products [facility] DRY EYE RELIEF OP) Place 1 drop into both eyes 3 (three) times daily as needed (for irritation).   Yes [provider]  Multiple Vitamin (MULTIVITAMIN WITH MINERALS) TABS tablet Take 1 tablet by mouth daily with breakfast.   Yes [provider]      Allergies    Sulfonamide derivatives and Penicillins    Review of Systems   Review of  Systems  Physical Exam Updated Vital Signs BP (!) 163/78 (BP Location: Right Arm)   Pulse 98   Temp (!) 97.4 F (36.3 C) (Oral)   Resp 16   Ht 5' (1.524 m)   Wt 40.8 kg   SpO2 96%   BMI 17.58 kg/m  Physical Exam Constitutional:      General: She is not in acute distress. HENT:     Head: Normocephalic and atraumatic.  Eyes:     Conjunctiva/sclera: Conjunctivae normal.     Pupils: Pupils are equal, round, and reactive to light.  Cardiovascular:     Rate and Rhythm: Normal rate and regular rhythm.  Pulmonary:     Effort: Pulmonary effort is normal. No respiratory distress.  Abdominal:     General: There is no distension.     Tenderness: There is no abdominal tenderness.  Skin:    General: Skin is warm and dry.  Neurological:     Mental Status: She is alert.     Comments: Expressive aphasia, no other evident cranial nerve deficits, patient is answering questions appropriately and following commands, no motor or sensory deficits of the upper or lower extremities     ED Results / Procedures / Treatments   Labs (all labs ordered are listed, but only abnormal results are displayed) Labs Reviewed  BASIC METABOLIC PANEL - Abnormal; Notable for the following components:      Result Value   Sodium 134 (*)    Glucose,  Bld 177 (*)    Creatinine, Ser 1.05 (*)    GFR, Estimated 50 (*)    All other components within normal limits  CBC - Abnormal; Notable for the following components:   WBC 12.4 (*)    HCT 46.8 (*)    MCV 103.8 (*)    All other components within normal limits  LIPID PANEL - Abnormal; Notable for the following components:   Cholesterol 206 (*)    LDL Cholesterol 114 (*)    All other components within normal limits  URINALYSIS, ROUTINE W REFLEX MICROSCOPIC  URINALYSIS, COMPLETE (UACMP) WITH MICROSCOPIC  LIPID PANEL  HEMOGLOBIN A1C    EKG EKG Interpretation Date/Time:  Monday June 01 2023 18:26:27 EST Ventricular Rate:  102 PR Interval:  184 QRS  Duration:  84 QT Interval:  334 QTC Calculation: 435 R Axis:   101  Text Interpretation: Sinus tachycardia Right axis deviation Consider left ventricular hypertrophy A Confirmed by Alvester Chou [id number]) on 06/01/2023 6:40:46 PM  Radiology CT Head Wo Contrast Result Date: 06/01/2023 CLINICAL DATA:  Expressive aphasia onset 36-48 hours ago. EXAM: CT HEAD WITHOUT CONTRAST TECHNIQUE: Contiguous axial images were obtained from the base of the skull through the vertex without intravenous contrast. RADIATION DOSE REDUCTION: This exam was performed according to the departmental dose-optimization program which includes automated exposure control, adjustment of the mA and/or kV according to patient size and/or use of iterative reconstruction technique. COMPARISON:  None Available. FINDINGS: Brain: The brain parenchyma above the level of the lateral ventricles is partially obscured by motion artifact and by metal streak artifact from multiple hair clips. There is mild global atrophy, mild small-vessel disease of the cerebral white matter. There are chronic appearing lacunar infarcts in both external capsules. Within study limitations no acute cortical based infarct, hemorrhage, mass or mass effect are seen. The ventricles are normal in size and position. The basal cisterns are clear. Incidentally seen is a 2.8 x 1.1 x 2.0 cm arachnoid cyst in the medial left temporal fossa. Vascular: The carotid siphons are heavily calcified. No hyperdense central vessel is seen. Skull: Negative for fractures or focal lesions. Sinuses/Orbits: Negative orbits aside from old lens extractions. There is mild membrane thickening in the ethmoid and maxillary sinuses. The frontal and sphenoid sinus, bilateral mastoid air cells, and middle ears are clear. The nasal septum is S shaped with right-sided spurring. There is a wax impaction in the right external auditory canal. Other: None. IMPRESSION: 1. No acute intracranial CT findings. Study  limitations as above. 2. Atrophy and small-vessel disease. 3. 2.8 x 1.1 x 2.0 cm arachnoid cyst in the medial left temporal fossa. 4. Sinus membrane disease. 5. S shaped nasal septum with right-sided spurring. 6. Wax impaction in the right external auditory canal. Electronically Signed   By: Almira Bar M.D.   On: 06/01/2023 20:48    Procedures Procedures    Medications Ordered in ED Medications   stroke: early stages of recovery book (has no administration in time range)  acetaminophen (TYLENOL) tablet 650 mg (has no administration in time range)    Or  acetaminophen (TYLENOL) 160 MG/5ML solution 650 mg (has no administration in time range)    Or  acetaminophen (TYLENOL) suppository 650 mg (has no administration in time range)  enoxaparin (LOVENOX) injection 40 mg (has no administration in time range)  aspirin EC tablet 81 mg (has no administration in time range)  clopidogrel (PLAVIX) tablet 75 mg (has no administration in time range)  ED Course/ Medical Decision Making/ A&P Clinical Course as of 06/01/23 2335  Mon Jun 01, 2023  2214 Dr Otelia Limes neurology consulted - recommending MRI (needs pacemaker compatibility tomorrow) and UA to complete infection w/u [MT]    Clinical Course User Index [MT] Renaye Rakers Kermit Balo, MD                                 Medical Decision Making Amount and/or Complexity of Data Reviewed Labs: ordered. Radiology: ordered.  Risk Decision regarding hospitalization.   This patient presents to the ED with concern for expressive aphasia, onset 36-48 hours ago. This involves an extensive number of treatment options, and is a complaint that carries with it a high risk of complications and morbidity.  The differential diagnosis includes CVA most likely versus metabolic derangement versus infection versus other  Co-morbidities that complicate the patient evaluation: Age, high cholesterol, or stroke risk factors  Additional history obtained from EMS and  family member at the bedside  External records from outside source obtained and reviewed including the office evaluation most recently noting a history of high cholesterol and tachybradycardia syndrome and pacemaker  I ordered and personally interpreted labs.  The pertinent results include: No emergent findings on blood work, minor leukocytosis.  UA is pending at the time of admission  I ordered imaging studies including CT image of the head I independently visualized and interpreted imaging which showed no clear emergent or acute findings I agree with the radiologist interpretation  The patient was maintained on a cardiac monitor.  I personally viewed and interpreted the cardiac monitored which showed an underlying rhythm of: Sinus rhythm and sinus tachycardia   I have reviewed the patients home medicines and have made adjustments as needed  Test Considered: Doubt acute meningitis, no indication for lumbar puncture.  Doubt acute PE  I requested consultation with the neurology,  and discussed lab and imaging findings as well as pertinent plan - they recommend: MR imaging when able, infection rule out with UA, formal consult pending  After the interventions noted above, I reevaluated the patient and found that they have: stayed the same  Dispostion:  After consideration of the diagnostic results and the patients response to treatment, I feel that the patent would benefit from medical admission.  Patient's family members were present at the bedside for this discussion and in agreement.  Specifically this was her grandniece.  She has no living children or spouse.         Final Clinical Impression(s) / ED Diagnoses Final diagnoses:  Aphasia    Rx / DC Orders ED Discharge Orders     None         Terald Sleeper, MD 06/01/23 2337

## 2023-06-01 NOTE — ED Triage Notes (Signed)
Pt bib ems from home c/o stroke. Great niece spoke with pt on phone and felt she was slurring her words. Pt was walking around house with aphasia. Pt was trying to communicate but u/t get words out correctly. Right side coordination issues. LKW 48 hours ago. Baseline independent at home by herself.   Pt agitated about coming to hospital. Pt has a smell of urine.  Hx skin cancer and pacemaker   BP 176/90 HR 102 RR 24 Capnography 18 CBG 130   Clarisa Schools Niece # [phone]

## 2023-06-01 NOTE — H&P (Signed)
History and Physical    Patient: Alexandria Wright DOB: 04/09/1930 DOA: 06/01/2023 DOS: the patient was seen and examined on 06/01/2023 PCP: Kermit Balo, DO  Patient coming from: Home  Chief Complaint:  Chief Complaint  Patient presents with   Aphasia   HPI: Alexandria Wright is a 89 y.o. female with medical history significant of SND s/p PPM placement.  Pt in to ED with new onset of aphasia.  Pt with slurred speech on phone according to family.  R sided coordination issues LKW 48h ago.  Baseline pt is independent at home by herself.  Pt limited to yes/no answers.  No PMH of stroke, no headache.   Review of Systems: As mentioned in the history of present illness. All other systems reviewed and are negative. Past Medical History:  Diagnosis Date   Bradycardia    a. s/p MDT dual chamber PPM    Esophageal stricture    GERD (gastroesophageal reflux disease)    Skin cancer    Syncope    Tachycardia    asyptomatic nonsustained   Past Surgical History:  Procedure Laterality Date   CATARACT EXTRACTION  12/10/2011   right    CATARACT EXTRACTION  12/17/2011   left   PACEMAKER PLACEMENT  2008   MDT dual chamber PPM implanted by Dr Graciela Husbands for symptomatic bradycardia   PPM GENERATOR CHANGEOUT N/A 03/04/2017   Procedure: PPM GENERATOR CHANGEOUT;  Surgeon: Duke Salvia, MD;  Location: [facility] INVASIVE CV LAB;  Service: Cardiovascular;  Laterality: N/A;   SKIN CANCER EXCISION     TONSILLECTOMY AND ADENOIDECTOMY     Social History:  reports that she has never smoked. She has never used smokeless tobacco. She reports that she does not drink alcohol and does not use drugs.  Allergies  Allergen Reactions   Sulfonamide Derivatives Other (See Comments)    "Made me very sick," per the patient    Penicillins Swelling and Rash    Has patient had a PCN reaction causing immediate rash, facial/tongue/throat swelling, SOB or lightheadedness with hypotension: Yes Has patient  had a PCN reaction causing severe rash involving mucus membranes or skin necrosis: Yes Has patient had a PCN reaction that required hospitalization: No Has patient had a PCN reaction occurring within the last 10 years: No If all of the above answers are "NO", then may proceed with Cephalosporin use.     Family History  Problem Relation Age of Onset   Heart disease Mother    Colon cancer Neg Hx     Prior to Admission medications   Medication Sig Start Date End Date Taking? Authorizing Provider  Calcium Carbonate-Vitamin D (OSCAL 500/200 D-3 PO) Take 1 tablet by mouth daily with breakfast.   Yes [provider]  Cholecalciferol (VITAMIN D3) 50 MCG (2000 UT) TABS Take 2,000 Units by mouth daily.   Yes [provider]  Homeopathic Products [facility] DRY EYE RELIEF OP) Place 1 drop into both eyes 3 (three) times daily as needed (for irritation).   Yes [provider]  Multiple Vitamin (MULTIVITAMIN WITH MINERALS) TABS tablet Take 1 tablet by mouth daily with breakfast.   Yes [provider]    Physical Exam: Vitals:   06/01/23 1826 06/01/23 1827  BP:  (!) 163/78  Pulse:  98  Resp:  16  Temp:  (!) 97.4 F (36.3 C)  TempSrc:  Oral  SpO2:  96%  Weight: 40.8 kg   Height: 5' (1.524 m)  Constitutional: NAD, calm, comfortable, underweight / cachectic. Respiratory: clear to auscultation bilaterally, no wheezing, no crackles. Normal respiratory effort. No accessory muscle use.  Cardiovascular: Regular rate and rhythm, no murmurs / rubs / gallops. No extremity edema. 2+ pedal pulses. No carotid bruits.  Abdomen: no tenderness, no masses palpated. No hepatosplenomegaly. Bowel sounds positive.  Neurologic: Dense expressive aphasia. Psychiatric: Normal judgment and insight. Alert and oriented x 3. Normal mood.   Data Reviewed:    Labs on Admission: I have personally reviewed following labs and imaging studies  CBC: Recent Labs  Lab 06/01/23 1852   WBC 12.4*  HGB 14.8  HCT 46.8*  MCV 103.8*  PLT 214   Basic Metabolic Panel: Recent Labs  Lab 06/01/23 1852  NA 134*  K 4.3  CL 98  CO2 22  GLUCOSE 177*  BUN 21  CREATININE 1.05*  CALCIUM 9.2   GFR: Estimated Creatinine Clearance: 22.5 mL/min (A) (by C-G formula based on SCr of 1.05 mg/dL (H)). Liver Function Tests: No results for input(s): "AST", "ALT", "ALKPHOS", "BILITOT", "PROT", "ALBUMIN" in the last 168 hours. No results for input(s): "LIPASE", "AMYLASE" in the last 168 hours. No results for input(s): "AMMONIA" in the last 168 hours. Coagulation Profile: No results for input(s): "INR", "PROTIME" in the last 168 hours. Cardiac Enzymes: No results for input(s): "CKTOTAL", "CKMB", "CKMBINDEX", "TROPONINI" in the last 168 hours. BNP (last 3 results) No results for input(s): "PROBNP" in the last 8760 hours. HbA1C: No results for input(s): "HGBA1C" in the last 72 hours. CBG: No results for input(s): "GLUCAP" in the last 168 hours. Lipid Profile: Recent Labs    06/01/23 1852  CHOL 206*  HDL 75  LDLCALC 114*  TRIG 86  CHOLHDL 2.7   Thyroid Function Tests: No results for input(s): "TSH", "T4TOTAL", "FREET4", "T3FREE", "THYROIDAB" in the last 72 hours. Anemia Panel: No results for input(s): "VITAMINB12", "FOLATE", "FERRITIN", "TIBC", "IRON", "RETICCTPCT" in the last 72 hours. Urine analysis: No results found for: "COLORURINE", "APPEARANCEUR", "LABSPEC", "PHURINE", "GLUCOSEU", "HGBUR", "BILIRUBINUR", "KETONESUR", "PROTEINUR", "UROBILINOGEN", "NITRITE", "LEUKOCYTESUR"  Radiological Exams on Admission: CT Head Wo Contrast Result Date: 06/01/2023 CLINICAL DATA:  Expressive aphasia onset 36-48 hours ago. EXAM: CT HEAD WITHOUT CONTRAST TECHNIQUE: Contiguous axial images were obtained from the base of the skull through the vertex without intravenous contrast. RADIATION DOSE REDUCTION: This exam was performed according to the departmental dose-optimization program which  includes automated exposure control, adjustment of the mA and/or kV according to patient size and/or use of iterative reconstruction technique. COMPARISON:  None Available. FINDINGS: Brain: The brain parenchyma above the level of the lateral ventricles is partially obscured by motion artifact and by metal streak artifact from multiple hair clips. There is mild global atrophy, mild small-vessel disease of the cerebral white matter. There are chronic appearing lacunar infarcts in both external capsules. Within study limitations no acute cortical based infarct, hemorrhage, mass or mass effect are seen. The ventricles are normal in size and position. The basal cisterns are clear. Incidentally seen is a 2.8 x 1.1 x 2.0 cm arachnoid cyst in the medial left temporal fossa. Vascular: The carotid siphons are heavily calcified. No hyperdense central vessel is seen. Skull: Negative for fractures or focal lesions. Sinuses/Orbits: Negative orbits aside from old lens extractions. There is mild membrane thickening in the ethmoid and maxillary sinuses. The frontal and sphenoid sinus, bilateral mastoid air cells, and middle ears are clear. The nasal septum is S shaped with right-sided spurring. There is a wax impaction in the right  external auditory canal. Other: None. IMPRESSION: 1. No acute intracranial CT findings. Study limitations as above. 2. Atrophy and small-vessel disease. 3. 2.8 x 1.1 x 2.0 cm arachnoid cyst in the medial left temporal fossa. 4. Sinus membrane disease. 5. S shaped nasal septum with right-sided spurring. 6. Wax impaction in the right external auditory canal. Electronically Signed   By: Almira Bar M.D.   On: 06/01/2023 20:48    EKG: Independently reviewed.   Assessment and Plan: * Aphasia Stroke pathway Tele monitor Interrogate pacemaker to see if it recorded any a.fib. Neuro consult CTA head and neck See below discussion under pacemaker about MRI NPO till passes swallow  screen PT/OT/SLP A1C, FLP Assuming no evidence of a.fib: ASA 81 daily Plavix 75 for 21 days  Pacemaker-Mdt Not sure that PPM and leads are going to be MR conditional: 1) Leads are from 2008 (before first MR conditional devices came out as far as I am aware) 2) Medtronic generator from 2018, doesn't have "MR" or "MRI" in the device name.      Advance Care Planning:   Code Status: Do not attempt resuscitation (DNR) PRE-ARREST INTERVENTIONS DESIRED Yellow form in chart  Consults: Neuro  Family Communication: Family at bedside  Severity of Illness: The appropriate patient status for this patient is OBSERVATION. Observation status is judged to be reasonable and necessary in order to provide the required intensity of service to ensure the patient's safety. The patient's presenting symptoms, physical exam findings, and initial radiographic and laboratory data in the context of their medical condition is felt to place them at decreased risk for further clinical deterioration. Furthermore, it is anticipated that the patient will be medically stable for discharge from the hospital within 2 midnights of admission.   Author: Hillary Bow., DO 06/01/2023 11:26 PM  For on call review [email or web address].

## 2023-06-01 NOTE — Assessment & Plan Note (Signed)
Not sure that PPM and leads are going to be MR conditional: 1) Leads are from 2008 (before first MR conditional devices came out as far as I am aware) 2) Medtronic generator from 2018, doesn't have "MR" or "MRI" in the device name.

## 2023-06-01 NOTE — Assessment & Plan Note (Addendum)
Stroke pathway Tele monitor Interrogate pacemaker to see if it recorded any a.fib. Neuro consult CTA head and neck See below discussion under pacemaker about MRI NPO till passes swallow screen PT/OT/SLP A1C, FLP Assuming no evidence of a.fib: ASA 81 daily Plavix 75 for 21 days UA pending to r/o UTI

## 2023-06-01 NOTE — Consult Note (Signed)
NEUROLOGY CONSULT NOTE   Date of service: June 01, 2023 Patient Name: Alexandria Wright MRN:  [id number] DOB:  [birth year] Chief Complaint: "slurred speech" Requesting Provider: Hillary Bow, DO  History of Present Illness   KALIE WEINHEIMER is 89 y.o. female with a past medical history of bradycardia s/p PPM , skin cancer, GERD, syncope who is presenting from home with slurred speech, right sided coordination issues, and aphasia.  Her last known well is 48 hours prior to presentation (1/18).  BP on arrival 176/90 heart rate 102, CBG 130. At baseline she is independent and does live by herself.  She is typically able to manage her own affairs.  She does have family in the area who do check on her frequently.  A family member spoke to her on the phone 1/19 and stated her speech was thick and funny.  On 1/18 when they spoke to her on the phone her speech was normal.  Her grand niece called her 1/20 and she was unable to get her words out, she then went to the house and found her walking around with altered speech.  EMS reports similar symptoms and additionally a strong smell of urine on scene.  She was able to nod and speak yes/no appropriately but is unable to complete a sentence.  She did walk to the ambulance herself when they came to pick her up.     ROS  Unable to ascertain due to aphasia  Past History   Past Medical History:  Diagnosis Date   Bradycardia    a. s/p MDT dual chamber PPM    Esophageal stricture    GERD (gastroesophageal reflux disease)    Skin cancer    Syncope    Tachycardia    asyptomatic nonsustained    Past Surgical History:  Procedure Laterality Date   CATARACT EXTRACTION  12/10/2011   right    CATARACT EXTRACTION  12/17/2011   left   PACEMAKER PLACEMENT  2008   MDT dual chamber PPM implanted by Dr Graciela Husbands for symptomatic bradycardia   PPM GENERATOR CHANGEOUT N/A 03/04/2017   Procedure: PPM GENERATOR CHANGEOUT;  Surgeon: Duke Salvia, MD;  Location: [facility]  INVASIVE CV LAB;  Service: Cardiovascular;  Laterality: N/A;   SKIN CANCER EXCISION     TONSILLECTOMY AND ADENOIDECTOMY      Family History: Family History  Problem Relation Age of Onset   Heart disease Mother    Colon cancer Neg Hx     Social History  reports that she has never smoked. She has never used smokeless tobacco. She reports that she does not drink alcohol and does not use drugs.  Allergies  Allergen Reactions   Sulfonamide Derivatives Other (See Comments)    "Made me very sick," per the patient    Penicillins Swelling and Rash    Has patient had a PCN reaction causing immediate rash, facial/tongue/throat swelling, SOB or lightheadedness with hypotension: Yes Has patient had a PCN reaction causing severe rash involving mucus membranes or skin necrosis: Yes Has patient had a PCN reaction that required hospitalization: No Has patient had a PCN reaction occurring within the last 10 years: No If all of the above answers are "NO", then may proceed with Cephalosporin use.     Medications   Current Facility-Administered Medications:    [START ON 06/02/2023]  stroke: early stages of recovery book, , Does not apply, Once, Julian Reil, Jared M, DO   acetaminophen (TYLENOL) tablet 650 mg, 650 mg, Oral,  Q4H PRN **OR** acetaminophen (TYLENOL) 160 MG/5ML solution 650 mg, 650 mg, Per Tube, Q4H PRN **OR** acetaminophen (TYLENOL) suppository 650 mg, 650 mg, Rectal, Q4H PRN, Julian Reil, Jared M, DO   [START ON 06/02/2023] aspirin EC tablet 81 mg, 81 mg, Oral, Daily, Julian Reil, Jared M, DO   [START ON 06/02/2023] clopidogrel (PLAVIX) tablet 75 mg, 75 mg, Oral, Daily, Julian Reil, Jared M, DO   [START ON 06/02/2023] enoxaparin (LOVENOX) injection 40 mg, 40 mg, Subcutaneous, Q24H, Julian Reil, Jared M, DO  Current Outpatient Medications:    Calcium Carbonate-Vitamin D (OSCAL 500/200 D-3 PO), Take 1 tablet by mouth daily with breakfast., Disp: , Rfl:    Cholecalciferol (VITAMIN D3) 50 MCG (2000 UT) TABS,  Take 2,000 Units by mouth daily., Disp: , Rfl:    Homeopathic Products (SIMILASAN DRY EYE RELIEF OP), Place 1 drop into both eyes 3 (three) times daily as needed (for irritation)., Disp: , Rfl:    Multiple Vitamin (MULTIVITAMIN WITH MINERALS) TABS tablet, Take 1 tablet by mouth daily with breakfast., Disp: , Rfl:   Vitals   Vitals:   06-15-2023 1826 06-15-2023 1827  BP:  (!) 163/78  Pulse:  98  Resp:  16  Temp:  (!) 97.4 F (36.3 C)  TempSrc:  Oral  SpO2:  96%  Weight: 40.8 kg   Height: 5' (1.524 m)     Body mass index is 17.58 kg/m.  Physical Exam   Physical Exam  HEENT-  [place]/AT     Lungs- Respirations unlabored Extremities- No edema  Neurological Examination Mental Status: Alert, able to state her name.  When asked the date she initially states December.  There is a component of mixed aphasia as she has difficulty following multistep commands but is able to mimic.  She is able to follow most simple commands.  Accurately reads sentences on stroke card and is able to generally describe the picture stating " he is grabbing, she is cleaning, water" when asked to write her name she just writes the letter "M" Cranial Nerves: II: Temporal visual fields intact with no extinction to DSS. PERRL  III,IV, VI: No ptosis. EOMI.  V: Temp sensation equal bilaterally  VII: Smile symmetric VIII: Hearing intact to voice IX,X: No hoarseness XI: Symmetric shoulder shrug XII: Midline tongue extension Motor: BUE 5/5 proximally and distally BLE 5/5 proximally and distally  No pronator drift.  Sensory: Temp and light touch intact throughout, bilaterally. No extinction to DSS.  Cerebellar: No ataxia with FNF bilaterally, HKS slowed on the right.  Gait: Deferred   Labs/Imaging/Neurodiagnostic studies   CBC:  Recent Labs  Lab 06/15/2023 1852  WBC 12.4*  HGB 14.8  HCT 46.8*  MCV 103.8*  PLT 214   Basic Metabolic Panel:  Lab Results  Component Value Date   NA 134 (L) 06-15-2023   K 4.3  2023-06-15   CO2 22 2023/06/15   GLUCOSE 177 (H) 15-Jun-2023   BUN 21 06-15-2023   CREATININE 1.05 (H) 06/15/23   CALCIUM 9.2 06-15-23   GFRNONAA 50 (L) 06-15-2023   GFRAA 68 11/13/2020   Lipid Panel:  Lab Results  Component Value Date   LDLCALC 114 (H) 2023/06/15   HgbA1c:  Lab Results  Component Value Date   HGBA1C 5.2 11/13/2020   Urine Drug Screen: No results found for: "LABOPIA", "COCAINSCRNUR", "LABBENZ", "AMPHETMU", "THCU", "LABBARB"  Alcohol Level No results found for: "ETH" INR  Lab Results  Component Value Date   INR 0.8 RATIO (L) 11/10/2006   APTT  Lab  Results  Component Value Date   APTT 29 02/24/2017     ASSESSMENT   ARYANAH PROVANCE is a 89 y.o. female  has a past medical history of Bradycardia, Esophageal stricture, GERD (gastroesophageal reflux disease), Skin cancer, Syncope, and Tachycardia.  - Exam reveals aphasia, dysarthria and right lower extremity coordination issues.  She does have swallowing issues at baseline and has previously had esophageal dilation.  Speech therapy evaluation is pending. - CT head: No acute intracranial CT findings. Study limitations noted. Atrophy and small-vessel disease. 2.8 x 1.1 x 2.0 cm arachnoid cyst in the medial left temporal fossa. Wax impaction in the right external auditory canal. - CTA of head and neck: Acute subocclusive thrombus within a proximal left M2 branch, inferior division. MCA branches are patent and perfused distally. Atheromatous change about the carotid bifurcations with up to 50% stenosis bilaterally. Atheromatous change about the carotid siphons with associated moderate stenoses about the supraclinoid segments bilaterally.  - Not known to have h/o a.fib  RECOMMENDATIONS  - HgbA1c, fasting lipid panel - Frequent neuro checks - Echocardiogram - Prophylactic therapy-Antiplatelet med: ASA 81mg  and Plavix 75mg  - Risk factor modification - Telemetry monitoring - PT consult, OT consult, Speech  consult  - Hospitalist is putting in consult for Cardiology to review her pacemaker data in the AM and make sure she didn't have any a.fib: She did have 36 fast atrial rhythm episodes, but not clear if any of those were a.fib. Cardiology recommended "review of stored EGMs" (presumably some way to see rhythm strips beyond a usual device interrogation).  - Hospitalist did inform family that even if it were a.fib we wouldn't be starting DOAC therapy in first few days after an acute stroke.   - CTA also reveals multifocal nodular densities within the posterior right upper lobe, likely reflecting infection/pneumonia. Further work up and management per primary team.  - Repeat CT Head at 24 hours  Addendum: RN has confirmed that the patient's pacemaker is NOT MRI compatible.  ______________________________________________________________________  Patient seen and examined by NP/APP with MD. MD to update note as needed.   Elmer Picker, DNP, FNP-BC [facility] Pager: [phone]   NEUROHOSPITALIST ADDENDUM Performed a face to face diagnostic evaluation.   I have reviewed the contents of history and physical exam as documented by PA/ARNP/Resident and agree with above documentation.  I have discussed and formulated the above plan as documented. Edits to the note have been made as needed.  Erick Blinks, MD [facility] [phone]   If 7pm to 7am, please call on call as listed on AMION.

## 2023-06-02 ENCOUNTER — Observation Stay (HOSPITAL_COMMUNITY): Payer: Medicare PPO

## 2023-06-02 DIAGNOSIS — Z8249 Family history of ischemic heart disease and other diseases of the circulatory system: Secondary | ICD-10-CM | POA: Diagnosis not present

## 2023-06-02 DIAGNOSIS — Z7982 Long term (current) use of aspirin: Secondary | ICD-10-CM | POA: Diagnosis not present

## 2023-06-02 DIAGNOSIS — E7800 Pure hypercholesterolemia, unspecified: Secondary | ICD-10-CM | POA: Diagnosis present

## 2023-06-02 DIAGNOSIS — R1310 Dysphagia, unspecified: Secondary | ICD-10-CM | POA: Diagnosis present

## 2023-06-02 DIAGNOSIS — R54 Age-related physical debility: Secondary | ICD-10-CM | POA: Diagnosis present

## 2023-06-02 DIAGNOSIS — R531 Weakness: Secondary | ICD-10-CM | POA: Diagnosis not present

## 2023-06-02 DIAGNOSIS — I639 Cerebral infarction, unspecified: Secondary | ICD-10-CM | POA: Diagnosis not present

## 2023-06-02 DIAGNOSIS — I6389 Other cerebral infarction: Secondary | ICD-10-CM | POA: Diagnosis not present

## 2023-06-02 DIAGNOSIS — Z515 Encounter for palliative care: Secondary | ICD-10-CM | POA: Diagnosis not present

## 2023-06-02 DIAGNOSIS — I700 Atherosclerosis of aorta: Secondary | ICD-10-CM | POA: Diagnosis not present

## 2023-06-02 DIAGNOSIS — R29810 Facial weakness: Secondary | ICD-10-CM | POA: Diagnosis present

## 2023-06-02 DIAGNOSIS — J189 Pneumonia, unspecified organism: Secondary | ICD-10-CM | POA: Diagnosis present

## 2023-06-02 DIAGNOSIS — Z79899 Other long term (current) drug therapy: Secondary | ICD-10-CM | POA: Diagnosis not present

## 2023-06-02 DIAGNOSIS — Z880 Allergy status to penicillin: Secondary | ICD-10-CM | POA: Diagnosis not present

## 2023-06-02 DIAGNOSIS — Z85828 Personal history of other malignant neoplasm of skin: Secondary | ICD-10-CM | POA: Diagnosis not present

## 2023-06-02 DIAGNOSIS — R64 Cachexia: Secondary | ICD-10-CM | POA: Diagnosis present

## 2023-06-02 DIAGNOSIS — Z66 Do not resuscitate: Secondary | ICD-10-CM | POA: Diagnosis present

## 2023-06-02 DIAGNOSIS — R4701 Aphasia: Secondary | ICD-10-CM

## 2023-06-02 DIAGNOSIS — G930 Cerebral cysts: Secondary | ICD-10-CM | POA: Diagnosis not present

## 2023-06-02 DIAGNOSIS — M810 Age-related osteoporosis without current pathological fracture: Secondary | ICD-10-CM | POA: Diagnosis present

## 2023-06-02 DIAGNOSIS — Z882 Allergy status to sulfonamides status: Secondary | ICD-10-CM | POA: Diagnosis not present

## 2023-06-02 DIAGNOSIS — I63512 Cerebral infarction due to unspecified occlusion or stenosis of left middle cerebral artery: Secondary | ICD-10-CM | POA: Diagnosis present

## 2023-06-02 DIAGNOSIS — I6523 Occlusion and stenosis of bilateral carotid arteries: Secondary | ICD-10-CM | POA: Diagnosis not present

## 2023-06-02 DIAGNOSIS — Z681 Body mass index (BMI) 19 or less, adult: Secondary | ICD-10-CM | POA: Diagnosis not present

## 2023-06-02 DIAGNOSIS — K219 Gastro-esophageal reflux disease without esophagitis: Secondary | ICD-10-CM | POA: Diagnosis present

## 2023-06-02 DIAGNOSIS — I495 Sick sinus syndrome: Secondary | ICD-10-CM | POA: Diagnosis present

## 2023-06-02 DIAGNOSIS — Z950 Presence of cardiac pacemaker: Secondary | ICD-10-CM | POA: Diagnosis not present

## 2023-06-02 DIAGNOSIS — R918 Other nonspecific abnormal finding of lung field: Secondary | ICD-10-CM | POA: Diagnosis not present

## 2023-06-02 DIAGNOSIS — J984 Other disorders of lung: Secondary | ICD-10-CM | POA: Diagnosis not present

## 2023-06-02 DIAGNOSIS — Z7902 Long term (current) use of antithrombotics/antiplatelets: Secondary | ICD-10-CM | POA: Diagnosis not present

## 2023-06-02 DIAGNOSIS — R29704 NIHSS score 4: Secondary | ICD-10-CM | POA: Diagnosis present

## 2023-06-02 LAB — URINALYSIS, COMPLETE (UACMP) WITH MICROSCOPIC
Bilirubin Urine: NEGATIVE
Glucose, UA: NEGATIVE mg/dL
Hgb urine dipstick: NEGATIVE
Ketones, ur: NEGATIVE mg/dL
Leukocytes,Ua: NEGATIVE
Nitrite: POSITIVE — AB
Protein, ur: NEGATIVE mg/dL
Specific Gravity, Urine: 1.014 (ref 1.005–1.030)
pH: 6 (ref 5.0–8.0)

## 2023-06-02 LAB — LIPID PANEL
Cholesterol: 210 mg/dL — ABNORMAL HIGH (ref 0–200)
HDL: 69 mg/dL (ref >40)
LDL Cholesterol: 121 mg/dL — ABNORMAL HIGH (ref 0–99)
Total CHOL/HDL Ratio: 3 {ratio}
Triglycerides: 98 mg/dL (ref <150)
VLDL: 20 mg/dL (ref 0–40)

## 2023-06-02 LAB — ECHOCARDIOGRAM COMPLETE
Area-P 1/2: 5.27 cm2
Height: 60 in
S' Lateral: 2 cm
Weight: 1440 [oz_av]

## 2023-06-02 LAB — PROCALCITONIN: Procalcitonin: <0.1 ng/mL

## 2023-06-02 LAB — HEMOGLOBIN A1C
Hgb A1c MFr Bld: 5.3 % (ref 4.8–5.6)
Mean Plasma Glucose: 105.41 mg/dL

## 2023-06-02 MED ORDER — CLOPIDOGREL BISULFATE 75 MG PO TABS
75.0000 mg | ORAL_TABLET | Freq: Every day | ORAL | Status: DC
  Administered 2023-06-03 – 2023-06-04 (×2): 75 mg via ORAL
  Filled 2023-06-02 (×2): qty 1

## 2023-06-02 MED ORDER — ROSUVASTATIN CALCIUM 5 MG PO TABS
10.0000 mg | ORAL_TABLET | Freq: Every day | ORAL | Status: DC
Start: 2023-06-03 — End: 2023-06-04
  Administered 2023-06-03 – 2023-06-04 (×2): 10 mg via ORAL
  Filled 2023-06-02 (×2): qty 2

## 2023-06-02 MED ORDER — ASPIRIN 325 MG PO TABS
325.0000 mg | ORAL_TABLET | ORAL | Status: AC
  Administered 2023-06-02: 325 mg via ORAL
  Filled 2023-06-02: qty 1

## 2023-06-02 MED ORDER — ASPIRIN 81 MG PO TBEC: 81.0000 mg | DELAYED_RELEASE_TABLET | Freq: Every day | ORAL | Status: DC

## 2023-06-02 MED ORDER — SODIUM CHLORIDE 0.9 % IV SOLN
500.0000 mg | INTRAVENOUS | Status: DC
  Administered 2023-06-02 – 2023-06-03 (×2): 500 mg via INTRAVENOUS
  Filled 2023-06-02 (×3): qty 5

## 2023-06-02 MED ORDER — IOHEXOL 350 MG/ML SOLN
75.0000 mL | Freq: Once | INTRAVENOUS | Status: AC | PRN
  Administered 2023-06-02: 75 mL via INTRAVENOUS

## 2023-06-02 MED ORDER — CEFTRIAXONE SODIUM 1 G IJ SOLR
1.0000 g | INTRAMUSCULAR | Status: DC
  Administered 2023-06-02 – 2023-06-03 (×2): 1 g via INTRAVENOUS
  Filled 2023-06-02 (×2): qty 10

## 2023-06-02 MED ORDER — CLOPIDOGREL BISULFATE 300 MG PO TABS
300.0000 mg | ORAL_TABLET | ORAL | Status: AC
  Administered 2023-06-02: 300 mg via ORAL
  Filled 2023-06-02: qty 1

## 2023-06-02 NOTE — ED Notes (Signed)
Pt transported for barium swallow.

## 2023-06-02 NOTE — ED Notes (Signed)
Discussed with [employer] rep, [employer], and informed pt pacemaker is NOT MRI compatible and that pt does not meet exclusion criteria to attempt MRI per the rep. Also requested that rep send over the stored EGM reports referred too in pt interrogation report d/t numerous atrial events, and records received as requested. Dr. Julian Reil and Neuro updated on device incompatibility. Dr. Julian Reil also shown pt rhythm strips from her device, and informed rep recommending pt EP be consulted to review pt device events.

## 2023-06-02 NOTE — Progress Notes (Signed)
Subjective: Patient admitted this morning, see detailed H&P by Dr Julian Reil 89 y.o. female with medical history significant of SND s/p PPM placement.   Pt in to ED with new onset of aphasia.  Pt with slurred speech on phone according to family.  R sided coordination issues LKW 48h ago.   CTA of head and neck: Acute subocclusive thrombus within a proximal left M2 branch, inferior division. MCA branches are patent and perfused distally. Atheromatous change about the carotid bifurcations with up to 50% stenosis bilaterally. Atheromatous change about the carotid siphons with associated moderate stenoses about the supraclinoid segments bilaterally.   Vitals:   06/02/23 0615 06/02/23 0628  BP:    Pulse: 81   Resp: (!) 26   Temp:  97.9 F (36.6 C)  SpO2: 96%       A/P  Aphasia/Stroke Neurology consulted- Was determined to NOT be a thrombectomy candidate due to 1) old age/frailty with high risk of catastrophic complication if treating a distal MCA branch, 2) LKN > 48 hours prior to presentation, 3) SUB-occlusive thrombus in left M2.   ? Pneumonia -seen on CTA neck -Chest x-ray showed progressive volume loss in the left hemithorax with asymmetric left basilar airspace disease which could reflect pneumonia -Also shows diffuse reticulonodular densities throughout both lung fields, suggesting chronic scarring. -Recommend CT chest when able to perform. -Will empirically start on Rocephin and Zithromax -Check procalcitonin  S/p PPM - cardiology consulted  -No A-fib noted on review of [employer] records per cardiology  Meredeth Ide [facility]

## 2023-06-02 NOTE — Progress Notes (Signed)
Asked to review PPM interrogation for any AFib, admitted w/stroke Transmission dated 06/01/23 Battery and lead measurements are good Presents in SR Reports 36 High atrial rate episodes Most recent was on 05/13/23 lasted 31 seconds EGM is incomplete, no arrhythmia noted on EGM tracing available for this event Otherwise back to Nov 2024, previously seen in her scheduled transmissions Are rare and brief atrial tachycardias, not Afib She has had some very short NSVTs as well, also seen for her in the past  In my review 05/13/23> appears an incomplete tracing though entire episode lasted on 31 seconds No AFib is seen in review of available tracings including her last home remote transmission in November Francis [place], [place]

## 2023-06-02 NOTE — ED Notes (Signed)
ECHO at bedside.

## 2023-06-02 NOTE — Evaluation (Signed)
Clinical/Bedside Swallow Evaluation Patient Details  Name: Alexandria Wright MRN: [id number] Date of Birth: 1929/08/09  Today's Date: 06/02/2023 Time: SLP Start Time (ACUTE ONLY): 0830 SLP Stop Time (ACUTE ONLY): 0846 SLP Time Calculation (min) (ACUTE ONLY): 16 min  Past Medical History:  Past Medical History:  Diagnosis Date   Bradycardia    a. s/p MDT dual chamber PPM    Esophageal stricture    GERD (gastroesophageal reflux disease)    Skin cancer    Syncope    Tachycardia    asyptomatic nonsustained   Past Surgical History:  Past Surgical History:  Procedure Laterality Date   CATARACT EXTRACTION  12/10/2011   right    CATARACT EXTRACTION  12/17/2011   left   PACEMAKER PLACEMENT  2008   MDT dual chamber PPM implanted by Dr Graciela Husbands for symptomatic bradycardia   PPM GENERATOR CHANGEOUT N/A 03/04/2017   Procedure: PPM GENERATOR CHANGEOUT;  Surgeon: Duke Salvia, MD;  Location: [facility] INVASIVE CV LAB;  Service: Cardiovascular;  Laterality: N/A;   SKIN CANCER EXCISION     TONSILLECTOMY AND ADENOIDECTOMY     HPI:  Alexandria Wright is a 89 y.o. female with medical history significant of SND s/p PPM placement. Pt in to ED with new onset of aphasia. Pt with slurred speech on phone according to family.  R sided coordination issues LKW 48h ago. Baseline pt is independent at home by herself. Per chart pt limited to yes/no answers. CT angio acute subocclusive thrombus within a proximal left M2 branch, inferior division. MCA branches are patent and perfused distally  Passed swallow screen then coughed taking medication and placed NPO with ST eval. CT also noted Multifocal nodular densities within the posterior right upper  lobe, likely reflecting infection/pneumonia.  Great niece reported esophageal dilation but not sure when it was completed.    Assessment / Plan / Recommendation  Clinical Impression  Pt has history of swallowing difficulties, coughs with po's, eats slow and small amounts per  great niece. Pt has had esophagus dilated in the past. Pt has upper dentures, lower partials and did not appreciate signficant oromotor weakness. She demonstrated indications of pharyngeal dysphagia with immediate coughs after several trials of thin liquid via cup. Consumed applesauce with what appeared to be prompt swallows and no s/s aspiration. Recommend MBS to fully evaluate which is scheduled today at 10:30. NPO until then. SLP Visit Diagnosis: Dysphagia, unspecified (R13.10)    Aspiration Risk  Mild aspiration risk;Moderate aspiration risk    Diet Recommendation NPO         Other  Recommendations Oral Care Recommendations: Oral care BID    Recommendations for follow up therapy are one component of a multi-disciplinary discharge planning process, led by the attending physician.  Recommendations may be updated based on patient status, additional functional criteria and insurance authorization.  Follow up Recommendations Follow physician's recommendations for discharge plan and follow up therapies      Assistance Recommended at Discharge    Functional Status Assessment Patient has had a recent decline in their functional status and demonstrates the ability to make significant improvements in function in a reasonable and predictable amount of time.  Frequency and Duration            Prognosis        Swallow Study   General Date of Onset: 06/02/23 HPI: Alexandria Wright is a 89 y.o. female with medical history significant of SND s/p PPM placement. Pt in to ED with new  onset of aphasia. Pt with slurred speech on phone according to family.  R sided coordination issues LKW 48h ago. Baseline pt is independent at home by herself. Per chart pt limited to yes/no answers. CT angio acute subocclusive thrombus within a proximal left M2 branch, inferior division. MCA branches are patent and perfused distally  Passed swallow screen then coughed taking medication and placed NPO with ST eval. CT  also noted Multifocal nodular densities within the posterior right upper  lobe, likely reflecting infection/pneumonia.  Great niece reported esophageal dilation but not sure when it was completed. Type of Study: Bedside Swallow Evaluation Previous Swallow Assessment:  (none) Diet Prior to this Study: NPO Temperature Spikes Noted: No Respiratory Status: Room air History of Recent Intubation: No Behavior/Cognition: Alert;Cooperative;Requires cueing Oral Cavity Assessment: Within Functional Limits Oral Care Completed by SLP: No Oral Cavity - Dentition: Dentures, top;Other (Comment) (partial lower) Vision: Functional for self-feeding Self-Feeding Abilities: Able to feed self Patient Positioning: Upright in bed Baseline Vocal Quality: Normal Volitional Cough: Weak Volitional Swallow: Able to elicit    Oral/Motor/Sensory Function Overall Oral Motor/Sensory Function: Within functional limits   Ice Chips Ice chips: Not tested Presentation: Cup   Thin Liquid Thin Liquid: Impaired Presentation: Cup Oral Phase Impairments:  (none) Pharyngeal  Phase Impairments: Cough - Immediate    Nectar Thick Nectar Thick Liquid: Not tested   Honey Thick Honey Thick Liquid: Not tested   Puree Puree: Within functional limits   Solid     Solid: Not tested      Royce Macadamia 06/02/2023,9:06 AM

## 2023-06-02 NOTE — Progress Notes (Signed)
Called by radiology:  Pt with acute sub occlusive thrombus L M2 segment.  I called and spoke with Dr. Otelia Limes:  Doesn't feel pt is a candidate for interventional rads / embolectomy at this point.  He will see pt next.  Asked that I load pt with 325 ASA and 300 plavix (ordered these stat).

## 2023-06-02 NOTE — Progress Notes (Signed)
Modified Barium Swallow Study  Patient Details  Name: Alexandria Wright MRN: [id number] Date of Birth: 09/08/1929  Today's Date: 06/02/2023  Modified Barium Swallow completed.  Full report located under Chart Review in the Imaging Section.  History of Present Illness Alexandria Wright is a 89 y.o. female with medical history significant of SND s/p PPM placement. Pt in to ED with new onset of aphasia. Pt with slurred speech on phone according to family.  R sided coordination issues LKW 48h ago. Baseline pt is independent at home by herself. Per chart pt limited to yes/no answers. CT angio acute subocclusive thrombus within a proximal left M2 branch, inferior division. MCA branches are patent and perfused distally  Passed swallow screen then coughed taking medication and placed NPO with ST eval. CT also noted Multifocal nodular densities within the posterior right upper  lobe, likely reflecting infection/pneumonia.  Great niece reported esophageal dilation but not sure when it was completed, that pt had to stand up sometimes after meals and was particular about what she could eat.   Clinical Impression Pt demonstrates oropharyngeal dysphagia some of which may be chronic with kyhposis of cervical spine, suspected esophageal involvement and likely exacerbated by acute stroke. Orally she exhibited decreased lingual control and delayed motion with trace lingual residue. Poor timing of laryngeal closure with sensed aspiration of thin liquid before the swallow and chin tuck decreased penetration to anterior vestibule wall (PAS 3) but did not eliminate. Nectar and honey thick were penetrated to the vocal cords with honey exiting the vestibule during the swallow and with spontaneous throat clear but nectar remained in vestibule (PAS 3). There was one episode of aspiration from pyriform sinus residue with puree that she sensed and coughed to clear majority. Epiglottic deflection was not present with thin liquids  likely exacerbated by protrusion of cervical spine preventing full inversion but present with heavier bolus but diminished. Vallecular and pyriform sinus residue present that she reduced but not completely clear with subsequent swallows. Mildly prolonged mastication with cracker and penetration but cannot give a penetration/aspiration scale rating since that bolus was not recorded. Esophagus was scanned revealing large amount of residue near proximal esophagus that did not appear to move throughout the study. Recommend Dys 1 (puree), honey thick liquid, crush meds, swallow twice, clear throat, remain upright 45 minutes after meal and GI consult may be beneficial. ST will continue to follow.   Factors that may increase risk of adverse event in presence of aspiration Rubye Oaks & Clearance Coots 2021):    Swallow Evaluation Recommendations Recommendations: PO diet PO Diet Recommendation: Dysphagia 1 (Pureed);Moderately thick liquids (Level 3, honey thick) Liquid Administration via: Spoon Medication Administration: Crushed with puree Supervision: Staff to assist with self-feeding Swallowing strategies  : Slow rate;Small bites/sips;Multiple dry swallows after each bite/sip;Clear throat intermittently Postural changes: Stay upright 30-60 min after meals;Position pt fully upright for meals Oral care recommendations: Oral care BID (2x/day) Recommended consults: Consider GI consultation      Royce Macadamia 06/02/2023,2:57 PM

## 2023-06-02 NOTE — Evaluation (Signed)
Occupational Therapy Evaluation Patient Details Name: Alexandria Wright MRN: [id number] DOB: 1929/05/21 Today's Date: 06/02/2023   History of Present Illness Alexandria Wright is a 89 y.o. female presenting to the ED with new onset of aphasia and positive CT scan for thrombus within a proximal left M2 branch,with medical history significant of SND s/p PPM placement, bradycardia GERD.    Marland Kitchen   Clinical Impression   Pt currently at min assist level for selfcare tasks sit to stand and functional transfers to the toilet.  Expressive deficits noted with some receptive when attempting to follow instructions for sensation, MMT, and coordination.  Prior to admission pt lived alone and was independent with ADLs, mobility, and also drove.  Family is supportive and can provide assist post discharge 24 hour initially if needed.  Recommend acute care OT to help progress ADL independence back to baseline with HHOT eval recommended to continue progression.         If plan is discharge home, recommend the following: A little help with walking and/or transfers;A little help with bathing/dressing/bathroom;Assistance with cooking/housework;Assist for transportation;Help with stairs or ramp for entrance    Functional Status Assessment  Patient has had a recent decline in their functional status and demonstrates the ability to make significant improvements in function in a reasonable and predictable amount of time.  Equipment Recommendations  None recommended by OT       Precautions / Restrictions Precautions Precautions: Fall Restrictions Weight Bearing Restrictions Per Provider Order: No      Mobility Bed Mobility Overal bed mobility: Needs Assistance Bed Mobility: Supine to Sit, Sit to Supine     Supine to sit: Supervision, HOB elevated Sit to supine: Supervision        Transfers Overall transfer level: Needs assistance Equipment used: None Transfers: Sit to/from Stand, Bed to  chair/wheelchair/BSC Sit to Stand: Min assist     Step pivot transfers: Min assist     General transfer comment: Min assist for functional mobility in the room and in the hallway without an assistive device.      Balance Overall balance assessment: Needs assistance Sitting-balance support: No upper extremity supported, Feet unsupported Sitting balance-Leahy Scale: Good     Standing balance support: During functional activity Standing balance-Leahy Scale: Poor Standing balance comment: Posterior LOB with mobility                           ADL either performed or assessed with clinical judgement   ADL Overall ADL's : Needs assistance/impaired Eating/Feeding: Independent;Sitting   Grooming: Wash/dry hands;Standing;Minimal assistance   Upper Body Bathing: Supervision/ safety;Sitting   Lower Body Bathing: Minimal assistance;Sit to/from stand   Upper Body Dressing : Set up;Sitting   Lower Body Dressing: Minimal assistance;Sit to/from stand   Toilet Transfer: Minimal assistance;Regular Toilet;Grab bars;Ambulation Toilet Transfer Details (indicate cue type and reason): ambulation without assistive device Toileting- Clothing Manipulation and Hygiene: Minimal assistance;Sit to/from stand       Functional mobility during ADLs: Minimal assistance (ambulation without assistive device) General ADL Comments: Pt with some difficulty with following verbal commands for MMT and for coordinaton testing.  When therapist touched her hands for sensation testing and asked if she could feel it on both sides, she started rubbing them together instead of answering the question.  Once re-directed, she stopped and gestured she could feel it.  Increased posterior LOB with standing and mobility.  This improved slightly with further distance.  Vision Baseline Vision/History: 0 No visual deficits Ability to See in Adequate Light: 0 Adequate Patient Visual Report: No change from  baseline Vision Assessment?: No apparent visual deficits Additional Comments: Grossly intact, will continue to assess in treatment.  Functionally, pt scanned both right and left without difficulty for locating objects in the room and in the hallway.     Perception Perception: Within Functional Limits       Praxis Praxis: WFL       Pertinent Vitals/Pain Pain Assessment Pain Assessment: No/denies pain     Extremity/Trunk Assessment Upper Extremity Assessment Upper Extremity Assessment: Overall WFL for tasks assessed   Lower Extremity Assessment Lower Extremity Assessment: Defer to PT evaluation   Cervical / Trunk Assessment Cervical / Trunk Assessment: Kyphotic   Communication Communication Communication: No apparent difficulties Cueing Techniques: Gestural cues;Verbal cues   Cognition Arousal: Alert Behavior During Therapy: WFL for tasks assessed/performed Overall Cognitive Status: Difficult to assess                                 General Comments: Pt with expressive deficits.  Not oriented to place when given choices, she shook her head "yes" when therapist asked if she was at a bank.  Possible receptive deficits vs motor planning but will continue to assess.  She needed mod demonstrational cueing for some testing (MMT and finger to nose).                Home Living Family/patient expects to be discharged to:: Private residence Living Arrangements: Alone Available Help at Discharge: Available 24 hours/day (Neice states family can work together to provide 24 hour initiially if needed) Type of Home: House Home Access: Stairs to enter Secretary/administrator of Steps: 3-4 Entrance Stairs-Rails: None Home Layout: One level     Bathroom Shower/Tub: IT trainer: Standard     Home Equipment: Cane - single point   Additional Comments: Walks up to 7 miles a day recently      Prior Functioning/Environment Prior Level  of Function : Independent/Modified Independent             Mobility Comments: No assistive device needed prior to admission          OT Problem List: Decreased knowledge of use of DME or AE;Decreased cognition;Decreased safety awareness;Impaired balance (sitting and/or standing);Decreased strength      OT Treatment/Interventions: Self-care/ADL training;Patient/family education;Balance training;Neuromuscular education;Therapeutic activities;DME and/or AE instruction;Cognitive remediation/compensation    OT Goals(Current goals can be found in the care plan section) Acute Rehab OT Goals Patient Stated Goal: Pt wants to go home soon OT Goal Formulation: With patient/family Time For Goal Achievement: 06/15/23 Potential to Achieve Goals: Good  OT Frequency: Min 1X/week       AM-PAC OT "6 Clicks" Daily Activity     Outcome Measure Help from another person eating meals?: None Help from another person taking care of personal grooming?: A Little Help from another person toileting, which includes using toliet, bedpan, or urinal?: A Little Help from another person bathing (including washing, rinsing, drying)?: A Little Help from another person to put on and taking off regular upper body clothing?: A Little Help from another person to put on and taking off regular lower body clothing?: A Little 6 Click Score: 19   End of Session Equipment Utilized During Treatment: Gait belt Nurse Communication: Mobility status  Activity Tolerance: Patient tolerated treatment well Patient  left: in bed;with call bell/phone within reach;with family/visitor present  OT Visit Diagnosis: Unsteadiness on feet (R26.81);Other abnormalities of gait and mobility (R26.89);Muscle weakness (generalized) (M62.81);Cognitive communication deficit (R41.841) Symptoms and signs involving cognitive functions: Cerebral infarction                Time: 8295-6213 OT Time Calculation (min): 43 min Charges:  OT General  Charges $OT Visit: 1 Visit OT Evaluation $OT Eval Moderate Complexity: 1 Mod OT Treatments $Self Care/Home Management : 23-37 mins  Perrin Maltese, OTR/L Acute Rehabilitation Services  Office [phone] 06/02/2023

## 2023-06-02 NOTE — Progress Notes (Signed)
Per [employer] rep who reinterrogated her device, no recent afib or aflutter. Device interrogation report clipped in the patient's physical chart.

## 2023-06-02 NOTE — Evaluation (Signed)
Physical Therapy Evaluation Patient Details Name: Alexandria Wright MRN: [id number] DOB: 07/02/1929 Today's Date: 06/02/2023  History of Present Illness  Alexandria Wright is a 89 y.o. female presenting to the ED with new onset of aphasia and positive CT scan for thrombus within a proximal left M2 branch,with medical history significant of SND s/p PPM placement, bradycardia GERD.    Marland Kitchen  Clinical Impression  Pt presents with admitting diagnosis above. Pt today was able to ambulate in hallway with no AD CGA/Min A. PTA pt lived alone and was very active walking 7 miles a day per pt's niece. Pt will likely need 24 hour assistance upon DC which family states they can provide. Recommend HHPT with RW. PT will continue to follow.          If plan is discharge home, recommend the following: A little help with walking and/or transfers;A little help with bathing/dressing/bathroom;Assistance with cooking/housework;Direct supervision/assist for medications management;Assist for transportation;Help with stairs or ramp for entrance;Supervision due to cognitive status   Can travel by private vehicle        Equipment Recommendations Rolling walker (2 wheels)  Recommendations for Other Services       Functional Status Assessment Patient has had a recent decline in their functional status and demonstrates the ability to make significant improvements in function in a reasonable and predictable amount of time.     Precautions / Restrictions Precautions Precautions: Fall Restrictions Weight Bearing Restrictions Per Provider Order: No      Mobility  Bed Mobility Overal bed mobility: Needs Assistance Bed Mobility: Supine to Sit, Sit to Supine     Supine to sit: Supervision, HOB elevated Sit to supine: Supervision        Transfers Overall transfer level: Needs assistance Equipment used: None Transfers: Sit to/from Stand Sit to Stand: Contact guard assist           General transfer comment:  CGA for safety. no over LOB noted.    Ambulation/Gait Ambulation/Gait assistance: Contact guard assist, Min assist Gait Distance (Feet): 25 Feet Assistive device: None Gait Pattern/deviations: Trunk flexed, Decreased stride length, Step-through pattern Gait velocity: decreased     General Gait Details: Pt slightly unsteady and noted to furniture walk at times. no overt LOB noted however pt would likely benefit from an AD initially. no RW or SPC in room at the time of session.  Stairs            Wheelchair Mobility     Tilt Bed    Modified Rankin (Stroke Patients Only)       Balance Overall balance assessment: Needs assistance Sitting-balance support: No upper extremity supported, Feet unsupported Sitting balance-Leahy Scale: Good     Standing balance support: During functional activity Standing balance-Leahy Scale: Poor Standing balance comment: Posterior LOB with mobility                             Pertinent Vitals/Pain Pain Assessment Pain Assessment: No/denies pain    Home Living Family/patient expects to be discharged to:: Private residence Living Arrangements: Alone Available Help at Discharge: Available 24 hours/day (Neice states family can work together to provide 24 hour initiially if needed) Type of Home: House Home Access: Stairs to enter Entrance Stairs-Rails: None Secretary/administrator of Steps: 3-4   Home Layout: One level Home Equipment: Cane - single point Additional Comments: Walks up to 7 miles a day recently    Prior Function Prior  Level of Function : Independent/Modified Independent             Mobility Comments: No assistive device needed prior to admission ADLs Comments: Ind     Extremity/Trunk Assessment   Upper Extremity Assessment Upper Extremity Assessment: Overall WFL for tasks assessed    Lower Extremity Assessment Lower Extremity Assessment: Overall WFL for tasks assessed    Cervical / Trunk  Assessment Cervical / Trunk Assessment: Kyphotic  Communication   Communication Communication: No apparent difficulties Cueing Techniques: Gestural cues;Verbal cues;Tactile cues  Cognition Arousal: Alert Behavior During Therapy: WFL for tasks assessed/performed Overall Cognitive Status: Difficult to assess                                 General Comments: Pt with expressive deficits.  Not oriented to place when given choices, she shook her head "yes" when therapist asked if she was at a bank.  Possible receptive deficits vs motor planning but will continue to assess.  She needed mod demonstrational cueing for some testing (MMT and finger to nose).        General Comments General comments (skin integrity, edema, etc.): VSS on RA    Exercises     Assessment/Plan    PT Assessment Patient needs continued PT services  PT Problem List Decreased strength;Decreased range of motion;Decreased activity tolerance;Decreased balance;Decreased coordination;Decreased mobility;Decreased cognition;Decreased knowledge of use of DME;Decreased safety awareness;Decreased knowledge of precautions;Cardiopulmonary status limiting activity       PT Treatment Interventions DME instruction;Gait training;Stair training;Functional mobility training;Therapeutic activities;Therapeutic exercise;Balance training;Neuromuscular re-education;Cognitive remediation;Patient/family education    PT Goals (Current goals can be found in the Care Plan section)  Acute Rehab PT Goals Patient Stated Goal: to go home PT Goal Formulation: With patient/family Time For Goal Achievement: 06/16/23 Potential to Achieve Goals: Good    Frequency Min 1X/week     Co-evaluation               AM-PAC PT "6 Clicks" Mobility  Outcome Measure Help needed turning from your back to your side while in a flat bed without using bedrails?: A Little Help needed moving from lying on your back to sitting on the side of a  flat bed without using bedrails?: A Little Help needed moving to and from a bed to a chair (including a wheelchair)?: A Little Help needed standing up from a chair using your arms (e.g., wheelchair or bedside chair)?: A Little Help needed to walk in hospital room?: A Little Help needed climbing 3-5 steps with a railing? : A Little 6 Click Score: 18    End of Session Equipment Utilized During Treatment: Gait belt Activity Tolerance: Patient tolerated treatment well Patient left: in bed;with call bell/phone within reach;with family/visitor present Nurse Communication: Mobility status PT Visit Diagnosis: Other abnormalities of gait and mobility (R26.89)    Time: 4098-1191 PT Time Calculation (min) (ACUTE ONLY): 10 min   Charges:   PT Evaluation $PT Eval Low Complexity: 1 Low   PT General Charges $$ ACUTE PT VISIT: 1 Visit         Shela Nevin, PT, DPT Acute Rehab Services [phone]   Gladys Damme 06/02/2023, 11:33 AM

## 2023-06-02 NOTE — ED Notes (Signed)
Pt able to complete swallow evaluation successfully for this nurse as documented. Family did report that pt does have difficulty at home with swallowing prior to her Stroke, but does not have any dietary modifications ordered excluding addition of Ensures. This nurse administered PO aspirin and plavix, pt was able to swallow her aspirin without any difficulties, but d/t the larger size of the Plavix loading dose pt coughing multiple times following taking it. Dr. Julian Reil made aware. Pt placed back on NPO, and speech path consult placed per protocol. Pt and family updated that pt NPO until speech sees her later today.

## 2023-06-03 ENCOUNTER — Inpatient Hospital Stay (HOSPITAL_COMMUNITY): Payer: Medicare PPO

## 2023-06-03 DIAGNOSIS — R531 Weakness: Secondary | ICD-10-CM | POA: Diagnosis not present

## 2023-06-03 DIAGNOSIS — Z66 Do not resuscitate: Secondary | ICD-10-CM | POA: Diagnosis not present

## 2023-06-03 DIAGNOSIS — I63512 Cerebral infarction due to unspecified occlusion or stenosis of left middle cerebral artery: Secondary | ICD-10-CM | POA: Diagnosis not present

## 2023-06-03 DIAGNOSIS — I639 Cerebral infarction, unspecified: Secondary | ICD-10-CM

## 2023-06-03 DIAGNOSIS — Z515 Encounter for palliative care: Secondary | ICD-10-CM

## 2023-06-03 DIAGNOSIS — R4701 Aphasia: Secondary | ICD-10-CM | POA: Diagnosis not present

## 2023-06-03 MED ORDER — ASPIRIN 81 MG PO CHEW
81.0000 mg | CHEWABLE_TABLET | Freq: Every day | ORAL | Status: DC
  Administered 2023-06-03 – 2023-06-04 (×2): 81 mg via ORAL
  Filled 2023-06-03 (×2): qty 1

## 2023-06-03 NOTE — Hospital Course (Signed)
89 y.o. female with medical history significant of SND s/p PPM placement.   Pt in to ED with new onset of aphasia.  Pt with slurred speech on phone according to family.  R sided coordination issues LKW 48h ago.   CTA of head and neck: Acute subocclusive thrombus within a proximal left M2 branch, inferior division. MCA branches are patent and perfused distally. Atheromatous change about the carotid bifurcations with up to 50% stenosis bilaterally. Atheromatous change about the carotid siphons with associated moderate stenoses about the supraclinoid segments bilaterally.

## 2023-06-03 NOTE — Progress Notes (Signed)
  Progress Note   Patient: Alexandria Wright FAO:[id number] DOB: Apr 13, 1930 DOA: 06/01/2023     1 DOS: the patient was seen and examined on 06/03/2023   Brief hospital course: 89 y.o. female with medical history significant of SND s/p PPM placement.   Pt in to ED with new onset of aphasia.  Pt with slurred speech on phone according to family.  R sided coordination issues LKW 48h ago.   CTA of head and neck: Acute subocclusive thrombus within a proximal left M2 branch, inferior division. MCA branches are patent and perfused distally. Atheromatous change about the carotid bifurcations with up to 50% stenosis bilaterally. Atheromatous change about the carotid siphons with associated moderate stenoses about the supraclinoid segments bilaterally.   Assessment and Plan: Aphasia/Stroke Neurology consulted- Was determined to NOT be a thrombectomy candidate due to 1) old age/frailty with high risk of catastrophic complication if treating a distal MCA branch, 2) LKN > 48 hours prior to presentation, 3) SUB-occlusive thrombus in left M2.  -Neurology recs for ASA 81mg  with plavix 75mg  x 3 mos then asa alone afterwards   Pneumonia -seen on CTA neck -Chest x-ray showed progressive volume loss in the left hemithorax with asymmetric left basilar airspace disease which could reflect pneumonia -Also shows diffuse reticulonodular densities throughout both lung fields, suggesting chronic scarring. -empirically started on Rocephin and Zithromax. Plan to treat 5 days total   S/p PPM - cardiology consulted  -No A-fib noted on review of [employer] records per cardiology      Subjective: Without complaints  Physical Exam: Vitals:   06/03/23 0944 06/03/23 1143 06/03/23 1546 06/03/23 1628  BP: (!) 154/78 137/77 (!) 179/79 (!) 154/73  Pulse: 84 81 99 (!) 101  Resp: 18 20 20    Temp: 98.4 F (36.9 C) (!) 97.4 F (36.3 C) 97.6 F (36.4 C)   TempSrc: Oral Oral Oral   SpO2: 96% 96% 97%   Weight:      Height:        General exam: Awake, laying in bed, in nad Respiratory system: Normal respiratory effort, no wheezing Cardiovascular system: regular rate, s1, s2 Gastrointestinal system: Soft, nondistended, positive BS Central nervous system: CN2-12 grossly intact, no tremors Extremities: Perfused, no clubbing Skin: Normal skin turgor, no notable skin lesions seen Psychiatry: difficult to assess given deficits  Data Reviewed:  There are no new results to review at this time.  Family Communication: Pt in room, family at bedside  Disposition: Status is: Inpatient Remains inpatient appropriate because: severity of illness  Planned Discharge Destination: Home   Author: Rickey Barbara, MD 06/03/2023 4:53 PM  For on call review [email or web address].

## 2023-06-03 NOTE — Progress Notes (Signed)
Lower extremity venous duplex completed. Please see CV Procedures for preliminary results.  Shona Simpson, RVT 06/03/23 1:17 PM

## 2023-06-03 NOTE — Consult Note (Signed)
Consultation Note Date: 06/03/2023   Patient Name: Alexandria Wright  DOB: 1930-02-08  MRN: [id number]  Age / Sex: 89 y.o., female  PCP: Kermit Balo, DO Referring Physician: Jerald Kief, MD  Reason for Consultation: Establishing goals of care  HPI/Patient Profile: 89 y.o. female  with past medical history of *** admitted on 06/01/2023 with ***.   89 y.o. female with past medical history of bradycardia status post pacemaker, skin cancer, GERD, syncope who presented from home 1/20 due to acute onset of slurred speech right side coordination issues and aphasia.  Her last known well was 48 hours prior to presentation.  At baseline patient is independent and lives by herself she is able to manage her own affairs.  There is family close by who checks on her frequently.  On ED assessment patient was able to state her name but had symptoms of dysarthria and mixed aphasia as she had difficulty following multistep commands but was able to mimic.  She had no focal weakness. NIH on Admission: 4 CT head negative for acute finding.  CTA showed acute subocclusive thrombus proximal left M2. Unable to get MRI as pacemaker is not compatible.   INTERIM HISTORY/SUBJECTIVE Grandniece at bedside.  On exam today, patient is oriented to self and lace, disoriented to month, year and age. She follows all commands, no dysarthria or aphasia, no focal weakness, no sensory deficit.  She is eager to go home.  Discussed plan of care and assessment, all questions answered.     Clinical Assessment and Goals of Care: This NP Lorinda Creed reviewed medical records, received report from team, assessed the patient and then meet at the patient's bedside  to discuss diagnosis, prognosis, GOC, EOL wishes disposition and options.   Concept of Palliative Care was introduced as specialized medical care for people and their families living with  serious illness.  If focuses on providing relief from the symptoms and stress of a serious illness.  The goal is to improve quality of life for both the patient and the family.  Created space and opportunity for patient  and family to explore thoughts and feelings regarding current medical situation     A  discussion was had today regarding advanced directives.  Concepts specific to code status, artifical feeding and hydration, continued IV antibiotics and rehospitalization was had.  The difference between a aggressive medical intervention path  and a palliative comfort care path for this patient at this time was had.  Values and goals of care important to patient and family were attempted to be elicited.   MOST form    Natural trajectory and expectations at EOL were discussed.  Questions and concerns addressed.  Patient  encouraged to call with questions or concerns.     PMT will continue to support holistically.             {Primary Decision OACZY:60630}    SUMMARY OF RECOMMENDATIONS   *** Code Status/Advance Care Planning: {Palliative Code status:23503}   Symptom Management:  ***  Palliative Prophylaxis:  {Palliative Prophylaxis:21015}  Additional Recommendations (Limitations, Scope, Preferences): {Recommended Scope and Preferences:21019}  Psycho-social/Spiritual:  Desire for further Chaplaincy support:{YES NO:22349} Additional Recommendations: {PAL SOCIAL:21064}  Prognosis:  {Palliative Care Prognosis:23504}  Discharge Planning: {Palliative dispostion:23505}      Primary Diagnoses: Present on Admission: . Aphasia . Pacemaker-Mdt . Stroke (cerebrum) (HCC)   I have reviewed the medical record, interviewed the patient and family, and examined the patient. The following aspects are pertinent.  Past Medical History:  Diagnosis Date  . Bradycardia    a. s/p MDT dual chamber PPM   . Esophageal stricture   . GERD (gastroesophageal reflux disease)   . Skin  cancer   . Syncope   . Tachycardia    asyptomatic nonsustained   Social History   Socioeconomic History  . Marital status: Widowed    Spouse name: Not on file  . Number of children: 0  . Years of education: Not on file  . Highest education level: Not on file  Occupational History  . Occupation: retired Runner, broadcasting/film/video  Tobacco Use  . Smoking status: Never  . Smokeless tobacco: Never  Vaping Use  . Vaping status: Never Used  Substance and Sexual Activity  . Alcohol use: No  . Drug use: No  . Sexual activity: Not on file  Other Topics Concern  . Not on file  Social History Narrative   Widow -husband died 06-29-03   Lives alone   Never smoked   Alcohol none   Walks 8 miles daily 5 days a week    Living Will   Social Drivers of Health   Financial Resource Strain: Low Risk  (09/21/2017)   Overall Financial Resource Strain (CARDIA)   . Difficulty of Paying Living Expenses: Not hard at all  Food Insecurity: No Food Insecurity (06/02/2023)   Hunger Vital Sign   . Worried About Programme researcher, broadcasting/film/video in the Last Year: Never true   . Ran Out of Food in the Last Year: Never true  Transportation Needs: No Transportation Needs (06/02/2023)   PRAPARE - Transportation   . Lack of Transportation (Medical): No   . Lack of Transportation (Non-Medical): No  Physical Activity: Sufficiently Active (09/21/2017)   Exercise Vital Sign   . Days of Exercise per Week: 7 days   . Minutes of Exercise per Session: 150+ min  Stress: No Stress Concern Present (09/21/2017)   [employer] of Occupational Health - Occupational Stress Questionnaire   . Feeling of Stress : Not at all  Social Connections: Moderately Integrated (06/02/2023)   Social Connection and Isolation Panel [NHANES]   . Frequency of Communication with Friends and Family: More than three times a week   . Frequency of Social Gatherings with Friends and Family: Never   . Attends Religious Services: More than 4 times per year   . Active Member  of Clubs or Organizations: Yes   . Attends Banker Meetings: Never   . Marital Status: Widowed   Family History  Problem Relation Age of Onset  . Heart disease Mother   . Colon cancer Neg Hx    Scheduled Meds: . aspirin  81 mg Oral Daily  . clopidogrel  75 mg Oral Daily  . enoxaparin (LOVENOX) injection  20 mg Subcutaneous Q24H  . rosuvastatin  10 mg Oral Daily   Continuous Infusions: . azithromycin Stopped (06/03/23 0647)  . cefTRIAXone (ROCEPHIN)  IV Stopped (06/03/23 0647)   PRN Meds:.acetaminophen **OR** acetaminophen (TYLENOL) oral liquid 160 mg/5  mL **OR** acetaminophen Medications Prior to Admission:  Prior to Admission medications   Medication Sig Start Date End Date Taking? Authorizing Provider  Calcium Carbonate-Vitamin D (OSCAL 500/200 D-3 PO) Take 1 tablet by mouth daily with breakfast.   Yes [provider]  Cholecalciferol (VITAMIN D3) 50 MCG (2000 UT) TABS Take 2,000 Units by mouth daily.   Yes [provider]  Homeopathic Products [facility] DRY EYE RELIEF OP) Place 1 drop into both eyes 3 (three) times daily as needed (for irritation).   Yes [provider]  Multiple Vitamin (MULTIVITAMIN WITH MINERALS) TABS tablet Take 1 tablet by mouth daily with breakfast.   Yes [provider]   Allergies  Allergen Reactions  . Sulfonamide Derivatives Other (See Comments)    "Made me very sick," per the patient   . Penicillins Swelling and Rash    Has patient had a PCN reaction causing immediate rash, facial/tongue/throat swelling, SOB or lightheadedness with hypotension: Yes Has patient had a PCN reaction causing severe rash involving mucus membranes or skin necrosis: Yes Has patient had a PCN reaction that required hospitalization: No Has patient had a PCN reaction occurring within the last 10 years: No If all of the above answers are "NO", then may proceed with Cephalosporin use.    Review of Systems  Unable to perform  ROS: Acuity of condition    Physical Exam  Vital Signs: BP (!) 154/78 (BP Location: Right Arm)   Pulse 84   Temp 98.4 F (36.9 C) (Oral)   Resp 18   Ht 5' (1.524 m)   Wt 40.8 kg   SpO2 96%   BMI 17.58 kg/m  Pain Scale: 0-10   Pain Score: 0-No pain   SpO2: SpO2: 96 % O2 Device:SpO2: 96 % O2 Flow Rate: .   IO: Intake/output summary:  Intake/Output Summary (Last 24 hours) at 06/03/2023 1044 Last data filed at 06/03/2023 0200 Gross per 24 hour  Intake 350 ml  Output --  Net 350 ml    LBM: Last BM Date : 06/02/23 Baseline Weight: Weight: 40.8 kg Most recent weight: Weight: 40.8 kg     Palliative Assessment/Data:     Time In: *** Time Out: *** Time Total: *** Greater than 50%  of this time was spent counseling and coordinating care related to the above assessment and plan.  Signed by: Lorinda Creed, NP   Please contact Palliative Medicine Team phone at [phone] for questions and concerns.  For individual provider: See Loretha Stapler

## 2023-06-03 NOTE — Plan of Care (Signed)
  Problem: Coping: Goal: Will verbalize positive feelings about self Outcome: Progressing

## 2023-06-03 NOTE — Progress Notes (Signed)
SLP Cancellation Note  Patient Details Name: Alexandria Wright MRN: [id number] DOB: 06-04-1929   Cancelled treatment:        Pt sleeping soundly and great niece state "she didn't sleep all night and is finally asleep" and requested SLP to return later. Will return as schedule allows.    Royce Macadamia 06/03/2023, 9:26 AM

## 2023-06-03 NOTE — Progress Notes (Signed)
Physical Therapy Treatment Patient Details Name: Alexandria Wright MRN: [id number] DOB: 1929/05/28 Today's Date: 06/03/2023   History of Present Illness Alexandria Wright is a 89 y.o. female presenting to the ED with new onset of aphasia and positive CT scan for thrombus within a proximal left M2 branch,with medical history significant of SND s/p PPM placement, bradycardia GERD.    Marland Kitchen    PT Comments  Pt resting in bed on arrival and eager for mobility. Pt demonstrating bed mobility at supervision level and transfers without UE support with grossly CGA for safety. Pt initially ambulating without AD support, with pt scissoring steps x2 and reaching out for UE support of furniture and holding this PTA by waist and shirt tail for increased support. Pt agreeable to trial rollator with significantly increased stability and increased gait velocity to [facility]  Pt able to accept mild balance challenges during gait with rollator support without LOB. Pt was educated on continued rollator use to maximize functional independence, safety, and decrease risk for falls with pt verbalizing understanding. Pt continues to benefit from skilled PT services to progress toward functional mobility goals.      If plan is discharge home, recommend the following: A little help with walking and/or transfers;A little help with bathing/dressing/bathroom;Assistance with cooking/housework;Direct supervision/assist for medications management;Assist for transportation;Help with stairs or ramp for entrance;Supervision due to cognitive status   Can travel by private vehicle        Equipment Recommendations  Rollator (4 wheels)    Recommendations for Other Services       Precautions / Restrictions Precautions Precautions: Fall Restrictions Weight Bearing Restrictions Per Provider Order: No     Mobility  Bed Mobility Overal bed mobility: Needs Assistance Bed Mobility: Supine to Sit, Sit to Supine     Supine to sit:  Supervision, HOB elevated Sit to supine: Supervision   General bed mobility comments: supervision for safety    Transfers Overall transfer level: Needs assistance Equipment used: None Transfers: Sit to/from Stand, Bed to chair/wheelchair/BSC Sit to Stand: Contact guard assist   Step pivot transfers: Supervision       General transfer comment: CGA for safety. no overt LOB noted.    Ambulation/Gait Ambulation/Gait assistance: Supervision, Min assist Gait Distance (Feet): 500 Feet Assistive device: None, Rollator (4 wheels) Gait Pattern/deviations: Trunk flexed, Decreased stride length, Step-through pattern Gait velocity: variabel     General Gait Details: Pt slightly unsteady and noted to furniture walk at times, holding this PTA by the shirt tail without AD support, ambulating to therapy gym without AD support, scissoring steps x2 with pt able to self correct with reaching for support, drastcially improved stability and gait cadence with rollator support with no LOB and WFL velocity for age   Stairs             Wheelchair Mobility     Tilt Bed    Modified Rankin (Stroke Patients Only)       Balance Overall balance assessment: Needs assistance Sitting-balance support: No upper extremity supported, Feet unsupported Sitting balance-Leahy Scale: Good     Standing balance support: During functional activity Standing balance-Leahy Scale: Fair                              Cognition Arousal: Alert Behavior During Therapy: WFL for tasks assessed/performed Overall Cognitive Status: Impaired/Different from baseline  General Comments: Pt with expressive deficits.        Exercises      General Comments General comments (skin integrity, edema, etc.): VSS on RA      Pertinent Vitals/Pain Pain Assessment Pain Assessment: Faces Faces Pain Scale: No hurt Pain Intervention(s): Monitored during session     Home Living       Type of Home: House                  Prior Function            PT Goals (current goals can now be found in the care plan section) Acute Rehab PT Goals Patient Stated Goal: to go home PT Goal Formulation: With patient/family Time For Goal Achievement: 06/16/23 Progress towards PT goals: Progressing toward goals    Frequency    Min 1X/week      PT Plan      Co-evaluation              AM-PAC PT "6 Clicks" Mobility   Outcome Measure  Help needed turning from your back to your side while in a flat bed without using bedrails?: A Little Help needed moving from lying on your back to sitting on the side of a flat bed without using bedrails?: A Little Help needed moving to and from a bed to a chair (including a wheelchair)?: A Little Help needed standing up from a chair using your arms (e.g., wheelchair or bedside chair)?: A Little Help needed to walk in hospital room?: A Little Help needed climbing 3-5 steps with a railing? : A Little 6 Click Score: 18    End of Session Equipment Utilized During Treatment: Gait belt Activity Tolerance: Patient tolerated treatment well Patient left: in bed;with call bell/phone within reach;with bed alarm set Nurse Communication: Mobility status PT Visit Diagnosis: Other abnormalities of gait and mobility (R26.89)     Time: 1610-9604 PT Time Calculation (min) (ACUTE ONLY): 19 min  Charges:    $Gait Training: 8-22 mins PT General Charges $$ ACUTE PT VISIT: 1 Visit                     Tobi Bastos R. PTA Acute Rehabilitation Services Office: [phone]   Catalina Antigua 06/03/2023, 4:27 PM

## 2023-06-03 NOTE — Progress Notes (Addendum)
STROKE TEAM PROGRESS NOTE   BRIEF HPI [name] is a 89 y.o. female with past medical history of bradycardia status post pacemaker, skin cancer, GERD, syncope who presented from home 1/20 due to acute onset of slurred speech right side coordination issues and aphasia.  Her last known well was 48 hours prior to presentation.  At baseline patient is independent and lives by herself she is able to manage her own affairs.  There is family close by who checks on her frequently.  On ED assessment patient was able to state her name but had symptoms of dysarthria and mixed aphasia as she had difficulty following multistep commands but was able to mimic.  She had no focal weakness. NIH on Admission: 4 CT head negative for acute finding.  CTA showed acute subocclusive thrombus proximal left M2. Unable to get MRI as pacemaker is not compatible.  INTERIM HISTORY/SUBJECTIVE  Grandniece at bedside.  On exam today, patient is oriented to self and lace, disoriented to month, year and age. She follows all commands, no dysarthria or aphasia, mild Rue weakness, no sensory deficit.  She is eager to go home.  Discussed plan of care and assessment, all questions answered.  OBJECTIVE  CBC    Component Value Date/Time   WBC 12.4 (H) 06/01/2023 1852   RBC 4.51 06/01/2023 1852   HGB 14.8 06/01/2023 1852   HGB 15.4 02/24/2017 1002   HCT 46.8 (H) 06/01/2023 1852   HCT 46.1 02/24/2017 1002   PLT 214 06/01/2023 1852   PLT 219 02/24/2017 1002   MCV 103.8 (H) 06/01/2023 1852   MCV 91 02/24/2017 1002   MCH 32.8 06/01/2023 1852   MCHC 31.6 06/01/2023 1852   RDW 12.9 06/01/2023 1852   RDW 14.0 02/24/2017 1002   LYMPHSABS 912 11/17/2022 0936   LYMPHSABS 1.0 08/27/2015 0822   MONOABS 759 09/01/2016 0820   EOSABS 86 11/17/2022 0936   EOSABS 0.1 08/27/2015 0822   BASOSABS 58 11/17/2022 0936   BASOSABS 0.1 08/27/2015 0822    BMET    Component Value Date/Time   NA 134 (L) 06/01/2023 1852   NA 139  02/24/2017 1002   K 4.3 06/01/2023 1852   CL 98 06/01/2023 1852   CO2 22 06/01/2023 1852   GLUCOSE 177 (H) 06/01/2023 1852   BUN 21 06/01/2023 1852   BUN 20 02/24/2017 1002   CREATININE 1.05 (H) 06/01/2023 1852   CREATININE 0.83 11/17/2022 0936   CALCIUM 9.2 06/01/2023 1852   EGFR 67 11/17/2022 0936   GFRNONAA 50 (L) 06/01/2023 1852   GFRNONAA 59 (L) 11/13/2020 0806    IMAGING past 24 hours CT HEAD WO CONTRAST ( ) Result Date: 06/03/2023 CLINICAL DATA:  Follow-up examination for stroke. EXAM: CT HEAD WITHOUT CONTRAST TECHNIQUE: Contiguous axial images were obtained from the base of the skull through the vertex without intravenous contrast. RADIATION DOSE REDUCTION: This exam was performed according to the departmental dose-optimization program which includes automated exposure control, adjustment of the mA and/or kV according to patient size and/or use of iterative reconstruction technique. COMPARISON:  Prior study from 1 06/02/2023. FINDINGS: Brain: Age-related cerebral atrophy. No acute intracranial hemorrhage. Now seen is a small focal hypodensity involving the left insular cortex, consistent with a small evolving acute left MCA distribution infarct (series 4, image 16). No other visible acute large vessel territory infarct. No mass lesion or midline shift. No hydrocephalus or extra-axial fluid collection. Benign arachnoid cyst noted at the left middle cranial fossa. Vascular: No  abnormal hyperdense vessel. Scattered vascular calcifications noted within the carotid siphons. Skull: Scalp soft tissues and calvarium demonstrate no new finding. Sinuses/Orbits: Globes orbital soft tissues within normal limits. Chronic mucosal thickening noted about the maxillary sinuses bilaterally. Paranasal sinuses are otherwise clear. No significant mastoid effusion. Other: None. IMPRESSION: 1. Small focal hypodensity now seen involving the left insular cortex, consistent with a small evolving acute left MCA  distribution infarct. No acute intracranial hemorrhage. 2. No other new acute intracranial abnormality. Electronically Signed   By: Rise Mu M.D.   On: 06/03/2023 01:07    Vitals:   06/03/23 0413 06/03/23 0422 06/03/23 0944 06/03/23 1143  BP: (!) 185/89 (!) 159/86 (!) 154/78 137/77  Pulse: 87 87 84 81  Resp: 16 20 18 20   Temp: 97.6 F (36.4 C)  98.4 F (36.9 C) (!) 97.4 F (36.3 C)  TempSrc: Oral  Oral Oral  SpO2: 96% 96% 96% 96%  Weight:      Height:         PHYSICAL EXAM General:  Alert, well-nourished, well-developed patient in no acute distress Psych:  Mood and affect appropriate for situation CV: Regular rate and rhythm on monitor Respiratory:  Regular, unlabored respirations on room air GI: Abdomen soft and nontender   NEURO:  Mental Status: Oriented to self and place disoriented to month and age. Speech/Language: speech is without dysarthria or aphasia.  Naming, repetition, fluency, and comprehension intact.  Cranial Nerves:  II: PERRL. Visual fields full.  III, IV, VI: EOMI. Eyelids elevate symmetrically.  V: Sensation is intact to light touch and symmetrical to face.  VII: Mild right facial droop VIII: hearing intact to voice. IX, X: Palate elevates symmetrically. Phonation is normal.  ZO:[id number] shrug 5/5. XII: tongue is midline without fasciculations. Motor: 5/5 strength to all muscle groups tested. Mild RUE drift seen.  Tone: is normal and bulk is normal Sensation- Intact to light touch bilaterally. Extinction absent to light touch to DSS.   Coordination: FTN intact bilaterally, HKS: no ataxia in BLE.No drift.  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 MCA branches are patent and perfused  distally. Atheromatous change about the carotid bifurcations with up to 50% stenosis bilaterally. MRI: unable to complete MRI as pacemaker is not-compatible CT head repeat  Small focal hypodensity now seen involving the left insular cortex, consistent with a small evolving acute left MCA distribution infarct.  VAS [place] LE: Negative DVT Pacemaker interrogation no A-fib 2D Echo: LVEF 60 to 65%  LDL 121 HgbA1c 5.3 VTE prophylaxis -Lovenox No antithrombotic prior to admission, now on aspirin 81 mg daily and clopidogrel 75 mg daily for 3 months due to left M2 stenosis and then aspirin alone. Therapy recommendations:  Home Health PT and Home Health OT Disposition:  discharge home  S/p Pacemaker Hx of Bradycardia Continue telemetry monitoring Report reviewed, no Afib seen  BP management BP stable Avoid low BP Long-term BP goal normotensive  Hyperlipidemia Home meds:  none LDL 121, goal < 70 Add Crestor 10mg , max dose for this patient Continue statin at discharge  Dysphagia Patient has post-stroke dysphagia SLP consulted On dysphagia 2 and nectar thick liquid Advance diet as tolerated  Other Stroke Risk Factors Advanced Age  Other medical issues History of  syncope  Patient is OK for discharge from neurology standpoint, with recommendations as above. Follow-up with outpatient neurology in 8 weeks.   Hospital day # 1   Pt seen by Neuro NP/APP and later by MD. Note/plan to be edited by MD as needed.    Lynnae January, DNP, AGACNP-BC [facility] Please use AMION for contact information & EPIC for messaging.  ATTENDING NOTE: I reviewed above note and agree with the assessment and plan. Pt was seen and examined.   Granddaughter at bedside.  Sitting in bed for lunch. Awake, alert, eyes open, orientated to age, place, time but confused with people. No aphasia, but a paucity of speech, following all simple commands. Able to name 2/3 and repeat simple sentence. No gaze  palsy, tracking bilaterally, visual field full.  Mild right facial droop. Tongue midline.  Right upper extremity drift with right finger grip decreased compared to the left, bilateral lower extremity 3/5 proximal and 4/5 distally. Sensation symmetrical bilaterally subjectively, b/l FTN intact grossly, gait not tested.   Patient left insular cortex infarct likely due to left M2 thrombus.  However pacemaker interrogation located, DVT negative.  No clear source of emboli, therefore large vessel disease possible.  Recommend DAPT for 3 months and then aspirin alone.  Add Crestor 10.  PT and OT recommend home health.  For detailed assessment and plan, please refer to above/below as I have made changes wherever appropriate.   Neurology will sign off. Please call with questions. Pt will follow up with stroke clinic NP at [facility] in about 4 weeks. Thanks for the consult.   Marvel Plan, MD PhD Stroke Neurology 06/03/2023 6:19 PM     To contact Stroke Continuity provider, please refer to [email or web address]. After hours, contact General Neurology

## 2023-06-03 NOTE — Progress Notes (Addendum)
Speech Language Pathology Treatment: Dysphagia  Patient Details Name: Alexandria Wright MRN: [id number] DOB: 1929-08-26 Today's Date: 06/03/2023 Time: 8657-8469 SLP Time Calculation (min) (ACUTE ONLY): 21 min  Assessment / Plan / Recommendation Clinical Impression  Pt's language and comprehension has improved since yesterday (see eval for details). Seen for swallow therapy with great niece present (different niece than yesterday) and reviewed MBS results. Observed with honey thick juice, upgraded nectar thick juice, applesauce, portion of puree lunch tray and upgraded textures of Dys 3 and regular. Pt has a history of esophageal dysphagia and clears her throat frequently when eating per family. Mastication with regular/cracker and peach was mildly prolonged and able to clear oral cavity. She was able to swallow a second time and throat clear when requested but also had several instances of immediate/reflexive and delayed throat clear and delayed cough. Also noted to have significant residue in proximal esophagus during MBS that may account for some s/s aspiration (GI spoke with family who chose not to proceed with EGD). During MBS pt had mild penetration with nectar and it is felt with a strong throat clear that she likely will be able to clear penetrate. She consumed cup sips nectar thickened juice and demonstrated consistent independence with throat clear following sip. Will upgrade liquids to nectar thick followed by throat clear. Discussed with family and upgraded texture to Dys 2 (fine chopped) with home health ST recommended to work with pt and recommend repeat MBS for liquid upgrade when appropriate.    HPI HPI: Alexandria Wright is a 89 y.o. female with medical history significant of SND s/p PPM placement. Pt in to ED with new onset of aphasia. Pt with slurred speech on phone according to family.  R sided coordination issues LKW 48h ago. Baseline pt is independent at home by herself. Per chart pt  limited to yes/no answers. CT angio acute subocclusive thrombus within a proximal left M2 branch, inferior division. MCA branches are patent and perfused distally  Passed swallow screen then coughed taking medication and placed NPO with ST eval. CT also noted Multifocal nodular densities within the posterior right upper  lobe, likely reflecting infection/pneumonia.  Great niece reported esophageal dilation but not sure when it was completed, that pt had to stand up sometimes after meals and was particular about what she could eat.      SLP Plan  Continue with current plan of care      Recommendations for follow up therapy are one component of a multi-disciplinary discharge planning process, led by the attending physician.  Recommendations may be updated based on patient status, additional functional criteria and insurance authorization.    Recommendations  Diet recommendations: Dysphagia 2 (fine chop); Nectar thick Liquids provided via: Cup Medication Administration: Crushed with puree Supervision: Patient able to self feed;Full supervision/cueing for compensatory strategies Compensations: Multiple dry swallows after each bite/sip;Clear throat intermittently;Small sips/bites;Slow rate Postural Changes and/or Swallow Maneuvers: Seated upright 90 degrees;Upright 30-60 min after meal                  Oral care BID   Frequent or constant Supervision/Assistance Dysphagia, oropharyngeal phase (R13.12)     Continue with current plan of care     Royce Macadamia  06/03/2023, 2:22 PM

## 2023-06-03 NOTE — Evaluation (Signed)
Speech Language Pathology Evaluation Patient Details Name: LACYE WEICHMAN MRN: [id number] DOB: 05-30-1929 Today's Date: 06/03/2023 Time: 1337-1400 SLP Time Calculation (min) (ACUTE ONLY): 23 min  Problem List:  Patient Active Problem List   Diagnosis Date Noted   Stroke (cerebrum) (HCC) 06/02/2023   Aphasia 06/01/2023   Underweight 09/05/2016   S/P placement of cardiac pacemaker 08/25/2014   Midline low back pain without sciatica 08/25/2014   Hyperkalemia 08/25/2014   Essential hypertension, benign 08/22/2013   Loss of weight 08/22/2013   Facial asymmetry 09/10/2012   Pacemaker-Mdt 07/08/2011   GERD 04/24/2009   Senile osteoporosis 04/24/2009   DYSPHAGIA UNSPECIFIED 04/24/2009   SINOATRIAL NODE DYSFUNCTION 06/02/2008   Esophageal stricture 06/02/2008   SYNCOPE AND COLLAPSE 06/02/2008   Past Medical History:  Past Medical History:  Diagnosis Date   Bradycardia    a. s/p MDT dual chamber PPM    Esophageal stricture    GERD (gastroesophageal reflux disease)    Skin cancer    Syncope    Tachycardia    asyptomatic nonsustained   Past Surgical History:  Past Surgical History:  Procedure Laterality Date   CATARACT EXTRACTION  12/10/2011   right    CATARACT EXTRACTION  12/17/2011   left   PACEMAKER PLACEMENT  2008   MDT dual chamber PPM implanted by Dr Graciela Husbands for symptomatic bradycardia   PPM GENERATOR CHANGEOUT N/A 03/04/2017   Procedure: PPM GENERATOR CHANGEOUT;  Surgeon: Duke Salvia, MD;  Location: [facility] INVASIVE CV LAB;  Service: Cardiovascular;  Laterality: N/A;   SKIN CANCER EXCISION     TONSILLECTOMY AND ADENOIDECTOMY     HPI:  FLORMARIA MOILANEN is a 89 y.o. female with medical history significant of SND s/p PPM placement. Pt in to ED with new onset of aphasia. Pt with slurred speech on phone according to family.  R sided coordination issues LKW 48h ago. Baseline pt is independent at home by herself. Per chart pt limited to yes/no answers. CT angio acute  subocclusive thrombus within a proximal left M2 branch, inferior division. MCA branches are patent and perfused distally  Passed swallow screen then coughed taking medication and placed NPO with ST eval. CT also noted Multifocal nodular densities within the posterior right upper  lobe, likely reflecting infection/pneumonia.  Great niece reported esophageal dilation but not sure when it was completed, that pt had to stand up sometimes after meals and was particular about what she could eat.   Assessment / Plan / Recommendation Clinical Impression  Pt's language has improved compared to yesterday from interactions during bedside swallow and MBS. She is expressing herself in sentences today with occasional motor speech errors noted with phonemic consonant substitutions. There were times when her semantics was decreased. Her comprehension breaks down when information is more complex/abstract. Results on the bedside WAB were as follows:  9/10 in auditory verbal comprehension, 10/10 on sequential commands, 7/10 on repetition (difficulty with longer sentence), object  naming was 9/10, motor apraxia 10/10. She accurately wrote her name and address. Pt would benefit from continued ST for higher level language abilities and recommend home health ST.    SLP Assessment  SLP Recommendation/Assessment: Patient needs continued Speech Lanaguage Pathology Services SLP Visit Diagnosis: Cognitive communication deficit (R41.841)    Recommendations for follow up therapy are one component of a multi-disciplinary discharge planning process, led by the attending physician.  Recommendations may be updated based on patient status, additional functional criteria and insurance authorization.    Follow Up Recommendations  Home health SLP    Assistance Recommended at Discharge  Frequent or constant Supervision/Assistance  Functional Status Assessment Patient has had a recent decline in their functional status and demonstrates  the ability to make significant improvements in function in a reasonable and predictable amount of time.  Frequency and Duration min 2x/week  2 weeks      SLP Evaluation Cognition  Overall Cognitive Status: Impaired/Different from baseline Arousal/Alertness: Awake/alert Orientation Level: Oriented to person;Oriented to place Attention: Sustained Sustained Attention: Appears intact Memory:  (not formally assessed- able to recall strategy for throat clear) Awareness: Impaired Awareness Impairment: Emergent impairment (not always aware of speech errors) Problem Solving:  (TBD) Safety/Judgment: Impaired       Comprehension  Auditory Comprehension Overall Auditory Comprehension: Impaired Yes/No Questions: Impaired Complex Questions: 75-100% accurate (9/10 on verbal comprehension on bedside WAB) Commands: Within Functional Limits (including complex) Visual Recognition/Discrimination Discrimination: Not tested Reading Comprehension Reading Status:  (TBA)    Expression Expression Primary Mode of Expression: Verbal Verbal Expression Overall Verbal Expression: Impaired Initiation: No impairment Level of Generative/Spontaneous Verbalization: Sentence Repetition: Impaired Level of Impairment:  (more complex sentence) Naming: Impairment Confrontation: Impaired (90%) Verbal Errors: Phonemic paraphasias Pragmatics: No impairment Written Expression Written Expression: Within Functional Limits (wroe name, and address)   Oral / Motor  Oral Motor/Sensory Function Overall Oral Motor/Sensory Function: Within functional limits Motor Speech Overall Motor Speech: Appears within functional limits for tasks assessed Respiration: Within functional limits Phonation: Normal Resonance: Within functional limits Articulation: Within functional limitis Intelligibility: Intelligible Motor Planning: Impaired Level of Impairment: Word Motor Speech Errors: Inconsistent            Royce Macadamia 06/03/2023, 3:41 PM

## 2023-06-04 ENCOUNTER — Other Ambulatory Visit (HOSPITAL_COMMUNITY): Payer: Self-pay

## 2023-06-04 DIAGNOSIS — I63512 Cerebral infarction due to unspecified occlusion or stenosis of left middle cerebral artery: Secondary | ICD-10-CM | POA: Diagnosis not present

## 2023-06-04 DIAGNOSIS — R4701 Aphasia: Secondary | ICD-10-CM | POA: Diagnosis not present

## 2023-06-04 MED ORDER — ASPIRIN 81 MG PO CHEW
81.0000 mg | CHEWABLE_TABLET | Freq: Every day | ORAL | 0 refills | Status: AC
  Filled 2023-06-04: qty 90, 90d supply, fill #0

## 2023-06-04 MED ORDER — ROSUVASTATIN CALCIUM 10 MG PO TABS
10.0000 mg | ORAL_TABLET | Freq: Every day | ORAL | 0 refills | Status: DC
  Filled 2023-06-04: qty 30, 30d supply, fill #0

## 2023-06-04 MED ORDER — AZITHROMYCIN 250 MG PO TABS
250.0000 mg | ORAL_TABLET | Freq: Every day | ORAL | 0 refills | Status: AC
Start: 2023-06-04 — End: 2023-06-07
  Filled 2023-06-04: qty 3, 3d supply, fill #0

## 2023-06-04 MED ORDER — CLOPIDOGREL BISULFATE 75 MG PO TABS
75.0000 mg | ORAL_TABLET | Freq: Every day | ORAL | 0 refills | Status: DC
  Filled 2023-06-04: qty 30, 30d supply, fill #0

## 2023-06-04 MED ORDER — CEFDINIR 300 MG PO CAPS
300.0000 mg | ORAL_CAPSULE | Freq: Two times a day (BID) | ORAL | 0 refills | Status: AC
  Filled 2023-06-04: qty 6, 3d supply, fill #0

## 2023-06-04 NOTE — Discharge Instructions (Signed)
Take both aspirin plus plavix together for 3 months, then aspirin alone afterwards

## 2023-06-04 NOTE — Plan of Care (Signed)
  Problem: Education: Goal: Knowledge of disease or condition will improve Outcome: Progressing   Problem: Ischemic Stroke/TIA Tissue Perfusion: Goal: Complications of ischemic stroke/TIA will be minimized Outcome: Progressing   Problem: Coping: Goal: Will verbalize positive feelings about self Outcome: Progressing   Problem: Self-Care: Goal: Ability to participate in self-care as condition permits will improve Outcome: Progressing Goal: Verbalization of feelings and concerns over difficulty with self-care will improve Outcome: Progressing Goal: Ability to communicate needs accurately will improve Outcome: Progressing   Problem: Safety: Goal: Ability to remain free from injury will improve Outcome: Progressing   Problem: Skin Integrity: Goal: Risk for impaired skin integrity will decrease Outcome: Progressing

## 2023-06-04 NOTE — Discharge Summary (Signed)
Physician Discharge Summary   Patient: KESSLER AGATE MRN: [id number] DOB: 07/12/1929  Admit date:     06/01/2023  Discharge date: 06/04/23  Discharge Physician: Rickey Barbara   PCP: Kermit Balo, DO   Recommendations at discharge:    Follow up with PCP in 1-2 weeks Follow up with Neurology as scheduled  Discharge Diagnoses: Principal Problem:   Aphasia Active Problems:   Pacemaker-Mdt   Stroke (cerebrum) (HCC)  Resolved Problems:   * No resolved hospital problems. *  Hospital Course: 89 y.o. female with medical history significant of SND s/p PPM placement.   Pt in to ED with new onset of aphasia.  Pt with slurred speech on phone according to family.  R sided coordination issues LKW 48h ago.   CTA of head and neck: Acute subocclusive thrombus within a proximal left M2 branch, inferior division. MCA branches are patent and perfused distally. Atheromatous change about the carotid bifurcations with up to 50% stenosis bilaterally. Atheromatous change about the carotid siphons with associated moderate stenoses about the supraclinoid segments bilaterally.   Assessment and Plan: Aphasia/Stroke Neurology consulted- Was determined to NOT be a thrombectomy candidate due to 1) old age/frailty with high risk of catastrophic complication if treating a distal MCA branch, 2) LKN > 48 hours prior to presentation, 3) SUB-occlusive thrombus in left M2.  -Neurology recs for ASA 81mg  with plavix 75mg  x 3 mos then asa alone afterwards   Pneumonia -seen on CTA neck -Chest x-ray showed progressive volume loss in the left hemithorax with asymmetric left basilar airspace disease which could reflect pneumonia -Also shows diffuse reticulonodular densities throughout both lung fields, suggesting chronic scarring. -empirically started on Rocephin and Zithromax. Plan to treat 5 days total abx with omnicef and azithro on d/c   S/p PPM - cardiology consulted  -No A-fib noted on review of [employer]  records per cardiology       Consultants: Neurology Procedures performed:   Disposition: Home Diet recommendation:  Cardiac diet DISCHARGE MEDICATION: Allergies as of 06/04/2023       Reactions   Sulfonamide Derivatives Other (See Comments)   "Made me very sick," per the patient   Penicillins Swelling, Rash   Has patient had a PCN reaction causing immediate rash, facial/tongue/throat swelling, SOB or lightheadedness with hypotension: Yes Has patient had a PCN reaction causing severe rash involving mucus membranes or skin necrosis: Yes Has patient had a PCN reaction that required hospitalization: No Has patient had a PCN reaction occurring within the last 10 years: No If all of the above answers are "NO", then may proceed with Cephalosporin use.        Medication List     TAKE these medications    aspirin 81 MG chewable tablet Chew 1 tablet (81 mg total) by mouth daily. Start taking on: June 05, 2023   azithromycin 250 MG tablet Commonly known as: Zithromax 1 tab po daily x 3 more days, zero refills   cefdinir 300 MG capsule Commonly known as: OMNICEF Take 1 capsule (300 mg total) by mouth 2 (two) times daily for 3 days.   clopidogrel 75 MG tablet Commonly known as: PLAVIX Take 1 tablet (75 mg total) by mouth daily. Start taking on: June 05, 2023   multivitamin with minerals Tabs tablet Take 1 tablet by mouth daily with breakfast.   OSCAL 500/200 D-3 PO Take 1 tablet by mouth daily with breakfast.   rosuvastatin 10 MG tablet Commonly known as: CRESTOR Take 1 tablet (10  mg total) by mouth daily. Start taking on: June 05, 2023   [employer] DRY EYE RELIEF OP Place 1 drop into both eyes 3 (three) times daily as needed (for irritation).   Vitamin D3 50 MCG (2000 UT) Tabs Take 2,000 Units by mouth daily.               Durable Medical Equipment  (From admission, onward)           Start     Ordered   06/03/23 1645  For home use only DME 4  wheeled rolling walker with seat  Once       Question:  Patient needs a walker to treat with the following condition  Answer:  Weakness   06/03/23 1644            Follow-up Information     [facility]. Schedule an appointment as soon as possible for a visit in 1 month(s).   Specialty: Neurology Why: stroke clinic Contact information: [address] [place] [address] [phone]        Renato Gails, Tiffany L, DO Follow up in 2 week(s).   Specialty: Geriatric Medicine Why: Hospital follow up Contact information: [address] [place] [address] [phone]                Discharge Exam: Filed Weights   06/01/23 1826  Weight: 40.8 kg   General exam: Awake, laying in bed, in nad Respiratory system: Normal respiratory effort, no wheezing Cardiovascular system: regular rate, s1, s2 Gastrointestinal system: Soft, nondistended, positive BS Central nervous system: CN2-12 grossly intact, strength intact Extremities: Perfused, no clubbing Skin: Normal skin turgor, no notable skin lesions seen Psychiatry: Mood normal // no visual hallucinations   Condition at discharge: fair  The results of significant diagnostics from this hospitalization (including imaging, microbiology, ancillary and laboratory) are listed below for reference.   Imaging Studies: VAS [place] LOWER EXTREMITY VENOUS (DVT) Result Date: 06/03/2023  Lower Venous DVT Study Patient Name:  TERESSA BAES Brownrigg  Date of Exam:   06/03/2023 Medical Rec #: [id number]          Accession #:    [id number] Date of Birth: 1929/10/16           Patient Gender: F Patient Age:   89 years Exam Location:  [facility] Procedure:      VAS [place] LOWER EXTREMITY VENOUS (DVT) Referring Phys: Scheryl Marten XU --------------------------------------------------------------------------------  Indications: Stroke.  Risk Factors: Cancer of skin past pregnancy. Limitations: Small frame.  Comparison Study: None. Performing Technologist: Shona Simpson  Examination Guidelines: A complete evaluation includes B-mode imaging, spectral Doppler, color Doppler, and power Doppler as needed of all accessible portions of each vessel. Bilateral testing is considered an integral part of a complete examination. Limited examinations for reoccurring indications may be performed as noted. The reflux portion of the exam is performed with the patient in reverse Trendelenburg.  +---------+---------------+---------+-----------+----------+--------------+ RIGHT    CompressibilityPhasicitySpontaneityPropertiesThrombus Aging +---------+---------------+---------+-----------+----------+--------------+ CFV      Full           Yes      Yes                                 +---------+---------------+---------+-----------+----------+--------------+ SFJ      Full                                                        +---------+---------------+---------+-----------+----------+--------------+  FV Prox  Full                                                        +---------+---------------+---------+-----------+----------+--------------+ FV Mid   Full                                                        +---------+---------------+---------+-----------+----------+--------------+ FV DistalFull                                                        +---------+---------------+---------+-----------+----------+--------------+ PFV      Full                                                        +---------+---------------+---------+-----------+----------+--------------+ POP      Full           Yes      Yes                                 +---------+---------------+---------+-----------+----------+--------------+ PTV      Full                                                        +---------+---------------+---------+-----------+----------+--------------+ PERO     Full                                                         +---------+---------------+---------+-----------+----------+--------------+   +---------+---------------+---------+-----------+----------+--------------+ LEFT     CompressibilityPhasicitySpontaneityPropertiesThrombus Aging +---------+---------------+---------+-----------+----------+--------------+ CFV      Full           Yes      Yes                                 +---------+---------------+---------+-----------+----------+--------------+ SFJ      Full                                                        +---------+---------------+---------+-----------+----------+--------------+ FV Prox  Full                                                        +---------+---------------+---------+-----------+----------+--------------+  FV Mid   Full                                                        +---------+---------------+---------+-----------+----------+--------------+ FV DistalFull                                                        +---------+---------------+---------+-----------+----------+--------------+ PFV      Full                                                        +---------+---------------+---------+-----------+----------+--------------+ POP      Full           Yes      Yes                                 +---------+---------------+---------+-----------+----------+--------------+ PTV      Full                                                        +---------+---------------+---------+-----------+----------+--------------+ PERO     Full                                                        +---------+---------------+---------+-----------+----------+--------------+     Summary: BILATERAL: - No evidence of deep vein thrombosis seen in the lower extremities, bilaterally. -No evidence of popliteal cyst, bilaterally.   *See table(s) above for measurements and observations. Electronically  signed by Coral Else MD on 06/03/2023 at 6:58:07 PM.    Final    CT HEAD WO CONTRAST ( ) Result Date: 06/03/2023 CLINICAL DATA:  Follow-up examination for stroke. EXAM: CT HEAD WITHOUT CONTRAST TECHNIQUE: Contiguous axial images were obtained from the base of the skull through the vertex without intravenous contrast. RADIATION DOSE REDUCTION: This exam was performed according to the departmental dose-optimization program which includes automated exposure control, adjustment of the mA and/or kV according to patient size and/or use of iterative reconstruction technique. COMPARISON:  Prior study from 1 06/02/2023. FINDINGS: Brain: Age-related cerebral atrophy. No acute intracranial hemorrhage. Now seen is a small focal hypodensity involving the left insular cortex, consistent with a small evolving acute left MCA distribution infarct (series 4, image 16). No other visible acute large vessel territory infarct. No mass lesion or midline shift. No hydrocephalus or extra-axial fluid collection. Benign arachnoid cyst noted at the left middle cranial fossa. Vascular: No abnormal hyperdense vessel. Scattered vascular calcifications noted within the carotid siphons. Skull: Scalp soft tissues and calvarium demonstrate no new finding. Sinuses/Orbits: Globes orbital soft tissues within normal limits. Chronic mucosal thickening noted about the maxillary sinuses  bilaterally. Paranasal sinuses are otherwise clear. No significant mastoid effusion. Other: None. IMPRESSION: 1. Small focal hypodensity now seen involving the left insular cortex, consistent with a small evolving acute left MCA distribution infarct. No acute intracranial hemorrhage. 2. No other new acute intracranial abnormality. Electronically Signed   By: Rise Mu M.D.   On: 06/03/2023 01:07   DG Swallowing Func-Speech Pathology Result Date: 06/02/2023 Table formatting from the original result was not included. Images from the original result were  not included. Modified Barium Swallow Study Patient Details Name: CHIANNA DELTORO MRN: [id number] Date of Birth: [birth year] Today's Date: 06/02/2023 HPI/PMH: HPI: CAELEIGH CRAPSER is a 89 y.o. female with medical history significant of SND s/p PPM placement. Pt in to ED with new onset of aphasia. Pt with slurred speech on phone according to family.  R sided coordination issues LKW 48h ago. Baseline pt is independent at home by herself. Per chart pt limited to yes/no answers. CT angio acute subocclusive thrombus within a proximal left M2 branch, inferior division. MCA branches are patent and perfused distally  Passed swallow screen then coughed taking medication and placed NPO with ST eval. CT also noted Multifocal nodular densities within the posterior right upper  lobe, likely reflecting infection/pneumonia.  Great niece reported esophageal dilation but not sure when it was completed, that pt had to stand up sometimes after meals and was particular about what she could eat. Clinical Impression: Clinical Impression: Pt demonstrates oropharyngeal dysphagia some of which may be chronic with kyhposis of cervical spine, suspected esophageal involvement and likely exacerbated by acute stroke. Orally she exhibited decreased lingual control and delayed motion with trace lingual residue. Poor timing of laryngeal closure with sensed aspiration of thin liquid before the swallow and chin tuck decreased penetration to anterior vestibule wall (PAS 3) but did not eliminate. Nectar and honey thick were penetrated to the vocal cords with honey exiting the vestibule during the swallow and with spontaneous throat clear but nectar remained in vestibule (PAS 3). There was one episode of aspiration from pyriform sinus residue with puree that she sensed and coughed to clear majority. Epiglottic deflection was not present with thin liquids likely exacerbated by protrusion of cervical spine preventing full inversion but present with heavier  bolus but diminished. Vallecular and pyriform sinus residue present that she reduced but not completely clear with subsequent swallows. Mildly prolonged mastication with cracker and penetration but cannot give a penetration/aspiration scale rating since that bolus was not recorded. Esophagus was scanned revealing large amount of residue near proximal esophagus that did not appear to move throughout the study. Recommend Dys 1 (puree), honey thick liquid, crush meds, swallow twice, clear throat, remain upright 45 minutes after meal and GI consult may be beneficial. ST will continue to follow. Factors that may increase risk of adverse event in presence of aspiration Rubye Oaks & Clearance Coots 2021): No data recorded Recommendations/Plan: Swallowing Evaluation Recommendations Swallowing Evaluation Recommendations Recommendations: PO diet PO Diet Recommendation: Dysphagia 1 (Pureed); Moderately thick liquids (Level 3, honey thick) Liquid Administration via: Spoon Medication Administration: Crushed with puree Supervision: Staff to assist with self-feeding Swallowing strategies  : Slow rate; Small bites/sips; Multiple dry swallows after each bite/sip; Clear throat intermittently Postural changes: Stay upright 30-60 min after meals; Position pt fully upright for meals Oral care recommendations: Oral care BID (2x/day) Recommended consults: Consider GI consultation Treatment Plan Treatment Plan Treatment recommendations: Therapy as outlined in treatment plan below Follow-up recommendations: -- (TBD) Functional status assessment: Patient has had  a recent decline in their functional status and demonstrates the ability to make significant improvements in function in a reasonable and predictable amount of time. Treatment frequency: Min 2x/week Treatment duration: 2 weeks Interventions: Patient/family education; Trials of upgraded texture/liquids; Diet toleration management by SLP Recommendations Recommendations for follow up therapy are  one component of a multi-disciplinary discharge planning process, led by the attending physician.  Recommendations may be updated based on patient status, additional functional criteria and insurance authorization. Assessment: Orofacial Exam: Orofacial Exam Oral Cavity: Oral Hygiene: WFL Oral Cavity - Dentition: Dentures, top; Other (Comment) (partial lower) Orofacial Anatomy: WFL Oral Motor/Sensory Function: WFL Anatomy: Anatomy: Other (Comment) (kyphosis of cervical spine) Boluses Administered: Boluses Administered Boluses Administered: Thin liquids (Level 0); Mildly thick liquids (Level 2, nectar thick); Moderately thick liquids (Level 3, honey thick); Puree; Solid  Oral Impairment Domain: Oral Impairment Domain Lip Closure: No labial escape Tongue control during bolus hold: Posterior escape of less than half of bolus Bolus preparation/mastication: Timely and efficient chewing and mashing Bolus transport/lingual motion: Delayed initiation of tongue motion (oral holding) Oral residue: Trace residue lining oral structures Location of oral residue : Tongue Initiation of pharyngeal swallow : Posterior laryngeal surface of the epiglottis  Pharyngeal Impairment Domain: Pharyngeal Impairment Domain Soft palate elevation: No bolus between soft palate (SP)/pharyngeal wall (PW) Laryngeal elevation: Partial superior movement of thyroid cartilage/partial approximation of arytenoids to epiglottic petiole Anterior hyoid excursion: Partial anterior movement Epiglottic movement: No inversion Laryngeal vestibule closure: Incomplete, narrow column air/contrast in laryngeal vestibule Pharyngeal stripping wave : Present - diminished Pharyngeal contraction (A/P view only): N/A Pharyngoesophageal segment opening: Partial distention/partial duration, partial obstruction of flow Tongue base retraction: Trace column of contrast or air between tongue base and PPW Pharyngeal residue: Collection of residue within or on pharyngeal structures  Location of pharyngeal residue: Valleculae; Pyriform sinuses  Esophageal Impairment Domain: Esophageal Impairment Domain Esophageal clearance upright position: Esophageal retention Pill: No data recorded Penetration/Aspiration Scale Score: Penetration/Aspiration Scale Score 3.  Material enters airway, remains ABOVE vocal cords and not ejected out: Mildly thick liquids (Level 2, nectar thick); Solid 4.  Material enters airway, CONTACTS cords then ejected out: Moderately thick liquids (Level 3, honey thick) 7.  Material enters airway, passes BELOW cords and not ejected out despite cough attempt by patient: Thin liquids (Level 0); Puree Compensatory Strategies: Compensatory Strategies Compensatory strategies: Yes Chin tuck: Ineffective (decreased to PAS 3) Ineffective Chin Tuck: Thin liquid (Level 0)   General Information: Caregiver present: Yes  Diet Prior to this Study: NPO   Temperature : Normal   Respiratory Status: WFL   Supplemental O2: None (Room air)   History of Recent Intubation: No  Behavior/Cognition: Alert; Cooperative; Pleasant mood; Requires cueing No data recorded Baseline vocal quality/speech: Normal No data recorded Volitional Swallow: Able to elicit Exam Limitations: No limitations Goal Planning: Prognosis for improved oropharyngeal function: Fair No data recorded No data recorded No data recorded Consulted and agree with results and recommendations: Pt unable/family or caregiver not available; Physician; Family member/caregiver Pain: Pain Assessment Pain Assessment: Faces Faces Pain Scale: 0 End of Session: Start Time:SLP Start Time (ACUTE ONLY): 1227 Stop Time: SLP Stop Time (ACUTE ONLY): 1251 Time Calculation:SLP Time Calculation (min) (ACUTE ONLY): 24 min Charges: SLP Evaluations $ SLP Speech Visit: 1 Visit SLP Evaluations $BSS Swallow: 1 Procedure $MBS Swallow: 1 Procedure SLP visit diagnosis: SLP Visit Diagnosis: Dysphagia, oropharyngeal phase (R13.12) Past Medical History: Past Medical  History: Diagnosis Date  Bradycardia   a. s/p MDT  dual chamber PPM   Esophageal stricture   GERD (gastroesophageal reflux disease)   Skin cancer   Syncope   Tachycardia   asyptomatic nonsustained Past Surgical History: Past Surgical History: Procedure Laterality Date  CATARACT EXTRACTION  12/10/2011  right   CATARACT EXTRACTION  12/17/2011  left  PACEMAKER PLACEMENT  2008  MDT dual chamber PPM implanted by Dr Graciela Husbands for symptomatic bradycardia  PPM GENERATOR CHANGEOUT N/A 03/04/2017  Procedure: PPM GENERATOR CHANGEOUT;  Surgeon: Duke Salvia, MD;  Location: [facility] INVASIVE CV LAB;  Service: Cardiovascular;  Laterality: N/A;  SKIN CANCER EXCISION    TONSILLECTOMY AND ADENOIDECTOMY   Royce Macadamia 06/02/2023, 2:56 PM  ECHOCARDIOGRAM COMPLETE Result Date: 06/02/2023    ECHOCARDIOGRAM REPORT   Patient Name:   STARLING ZEE Paisley Date of Exam: 06/02/2023 Medical Rec #:  [id number]         Height:       60.0 in Accession #:    [id number]        Weight:       90.0 lb Date of Birth:  1930/03/27          BSA:          1.329 m Patient Age:    89 years          BP:           176/82 mmHg Patient Gender: F                 HR:           83 bpm. Exam Location:  Inpatient Procedure: 2D Echo, Cardiac Doppler and Color Doppler Indications:    Stroke  History:        Patient has prior history of Echocardiogram examinations, most                 recent 10/30/2016. Stroke, Signs/Symptoms:Syncope; Risk                 Factors:Hypertension.  Sonographer:    Webb Laws Referring Phys: [id number] JARED M GARDNER IMPRESSIONS  1. Left ventricular ejection fraction, by estimation, is 60 to 65%. The left ventricle has normal function. The left ventricle has no regional wall motion abnormalities. Left ventricular diastolic parameters are consistent with Grade I diastolic dysfunction (impaired relaxation). Elevated left atrial pressure.  2. Right ventricular systolic function is normal. The right ventricular size is normal.  3. The mitral valve is  myxomatous. Mild to moderate mitral valve regurgitation. No evidence of mitral stenosis. There is mild holosystolic prolapse of the middle segment of the anterior leaflet of the mitral valve.  4. The aortic valve is tricuspid. There is mild calcification of the aortic valve. Aortic valve regurgitation is not visualized. Aortic valve sclerosis/calcification is present, without any evidence of aortic stenosis.  5. The inferior vena cava is normal in size with greater than 50% respiratory variability, suggesting right atrial pressure of 3 mmHg. Comparison(s): Prior images unable to be directly viewed, comparison made by report only. Mitral insufficiency appears slightly worse. FINDINGS  Left Ventricle: Left ventricular ejection fraction, by estimation, is 60 to 65%. The left ventricle has normal function. The left ventricle has no regional wall motion abnormalities. The left ventricular internal cavity size was normal in size. There is  no left ventricular hypertrophy. Left ventricular diastolic parameters are consistent with Grade I diastolic dysfunction (impaired relaxation). Elevated left atrial pressure. Right Ventricle: The right ventricular size is normal. No increase in right ventricular wall  thickness. Right ventricular systolic function is normal. Left Atrium: Left atrial size was normal in size. Right Atrium: Right atrial size was normal in size. Pericardium: There is no evidence of pericardial effusion. Mitral Valve: The mitral valve is myxomatous. There is mild holosystolic prolapse of the middle segment of the anterior leaflet of the mitral valve. There is moderate thickening of the anterior mitral valve leaflet(s). There is mild calcification of the anterior mitral valve leaflet(s). Mild to moderate mitral valve regurgitation, with posteriorly-directed jet. No evidence of mitral valve stenosis. Tricuspid Valve: The tricuspid valve is normal in structure. Tricuspid valve regurgitation is mild . No evidence  of tricuspid stenosis. Aortic Valve: The aortic valve is tricuspid. There is mild calcification of the aortic valve. Aortic valve regurgitation is not visualized. Aortic valve sclerosis/calcification is present, without any evidence of aortic stenosis. Pulmonic Valve: The pulmonic valve was normal in structure. Pulmonic valve regurgitation is not visualized. No evidence of pulmonic stenosis. Aorta: The aortic root is normal in size and structure. Venous: The inferior vena cava is normal in size with greater than 50% respiratory variability, suggesting right atrial pressure of 3 mmHg. IAS/Shunts: No atrial level shunt detected by color flow Doppler. Additional Comments: A is visualized in the right ventricle and right atrium.  LEFT VENTRICLE PLAX 2D LVIDd:         2.90 cm   Diastology LVIDs:         2.00 cm   LV e' medial:    3.00 cm/s LV PW:         1.10 cm   LV E/e' medial:  26.6 LV IVS:        1.10 cm   LV e' lateral:   5.13 cm/s LVOT diam:     2.10 cm   LV E/e' lateral: 15.5 LV SV:         67 LV SV Index:   50 LVOT Area:     3.46 cm  RIGHT VENTRICLE            IVC RV Basal diam:  2.60 cm    IVC diam: 1.05 cm RV S prime:     9.01 cm/s TAPSE (M-mode): 1.1 cm LEFT ATRIUM           Index        RIGHT ATRIUM          Index LA diam:      3.10 cm 2.33 cm/m   RA Area:     3.63 cm LA Vol (A4C): 18.8 ml 14.14 ml/m  RA Volume:   4.83 ml  3.63 ml/m  AORTIC VALVE LVOT Vmax:   97.90 cm/s LVOT Vmean:  67.100 cm/s LVOT VTI:    0.193 m  AORTA Ao Root diam: 2.90 cm Ao Asc diam:  3.00 cm MITRAL VALVE                TRICUSPID VALVE MV Area (PHT): 5.27 cm     TR Peak grad:   25.2 mmHg MV Decel Time: 144 msec     TR Vmax:        251.00 cm/s MV E velocity: 79.70 cm/s MV A velocity: 123.00 cm/s  SHUNTS MV E/A ratio:  0.65         Systemic VTI:  0.19 m                             Systemic Diam: 2.10 cm Mihai  Croitoru MD Electronically signed by Thurmon Fair MD Signature Date/Time: 06/02/2023/2:50:52 PM    Final    DG Chest Port  1V same Day Result Date: 06/02/2023 CLINICAL DATA:  Pneumonia. EXAM: PORTABLE CHEST 1 VIEW COMPARISON:  Radiographs 11/20/2006 and 11/14/2006. CTA of the neck 06/02/2023. No other recent imaging available. FINDINGS: 0936 hours. Left subclavian pacemaker leads project over the right atrium and right ventricle. The heart size and mediastinal contours are stable with aortic atherosclerosis. Compared with the remote radiographs, there is progressive volume loss in the left hemithorax with asymmetric left basilar airspace disease which could reflect pneumonia. In addition, there are diffuse reticulonodular densities throughout both lungs which have progressed from the remote priors, the apical components likely reflecting chronic scarring. No evidence of pneumothorax or significant pleural effusion. There is a mild convex left thoracic scoliosis. IMPRESSION: 1. Progressive volume loss in the left hemithorax with asymmetric left basilar airspace disease which could reflect pneumonia. 2. Diffuse reticulonodular densities throughout both lungs, progressed from the remote priors, likely reflecting chronic scarring. 3. Suggest short-term radiographic follow-up. Chest CT could be performed if clinically warranted (no current clinical evidence of pneumonia or no response to appropriate treatment on follow-up). Electronically Signed   By: Carey Bullocks M.D.   On: 06/02/2023 10:15   CT ANGIO HEAD NECK W WO CM Result Date: 06/02/2023 CLINICAL DATA:  Initial evaluation for neuro deficit, stroke suspected. EXAM: CT ANGIOGRAPHY HEAD AND NECK WITH AND WITHOUT CONTRAST TECHNIQUE: Multidetector CT imaging of the head and neck was performed using the standard protocol during bolus administration of intravenous contrast. Multiplanar CT image reconstructions and MIPs were obtained to evaluate the vascular anatomy. Carotid stenosis measurements (when applicable) are obtained utilizing NASCET criteria, using the distal internal carotid  diameter as the denominator. RADIATION DOSE REDUCTION: This exam was performed according to the departmental dose-optimization program which includes automated exposure control, adjustment of the mA and/or kV according to patient size and/or use of iterative reconstruction technique. CONTRAST:  75mL OMNIPAQUE IOHEXOL 350 MG/ML SOLN COMPARISON:  CT from 06/01/2023. FINDINGS: CTA NECK FINDINGS Aortic arch: Examination severely degraded by motion artifact. Partially visualized aortic arch grossly within normal limits for caliber. Origin of the great vessels incompletely visualized and not well evaluated on this degraded exam. Right carotid system: Right common and internal carotid arteries are patent without dissection. Calcified plaque about the right carotid bulb with up to 50% stenosis by NASCET criteria. Left carotid system: Left common and internal carotid arteries are patent without dissection. Calcified plaque about the left carotid bulb with up to 50% stenosis by NASCET criteria. Vertebral arteries: Left vertebral artery arises directly from the aortic arch. Right vertebral artery dominant. Vertebral arteries patent without visible stenosis or dissection. Skeleton: Exaggeration of the normal thoracic kyphosis and cervical lordosis. No discrete or worrisome osseous lesions. Other neck: No other acute finding. Upper chest: Scattered bronchiectatic changes with irregular biapical pleuroparenchymal thickening/scarring. Multifocal nodular densities within the posterior right upper lobe, likely reflecting infection/pneumonia. Review of the MIP images confirms the above findings CTA HEAD FINDINGS Anterior circulation: Atheromatous change about the carotid siphons with associated moderate stenoses about the supraclinoid segments bilaterally. A1 segments patent. Normal anterior communicating artery complex. Anterior cerebral arteries patent without significant stenosis. M1 segments patent bilaterally. Right MCA branches  patent and well perfused. On the left, there is subocclusive thrombus within a proximal left M2 branch, inferior division (series 9, image 100). MCA branches are patent and perfused distally. Posterior circulation: Both V4 segments  patent without significant stenosis. Left PICA patent. Right PICA not well seen. Basilar patent without significant stenosis. Superior cerebral arteries patent bilaterally. Left PCA primarily supplied via the basilar. Predominant fetal type origin of the right PCA. PCAs patent without significant stenosis. Venous sinuses: Patent allowing for timing the contrast bolus. Anatomic variants: As above.  No aneurysm. Review of the MIP images confirms the above findings IMPRESSION: 1. Acute subocclusive thrombus within a proximal left M2 branch, inferior division. MCA branches are patent and perfused distally. 2. Atheromatous change about the carotid bifurcations with up to 50% stenosis bilaterally. 3. Atheromatous change about the carotid siphons with associated moderate stenoses about the supraclinoid segments bilaterally. 4. Multifocal nodular densities within the posterior right upper lobe, likely reflecting infection/pneumonia. Critical value/emergent results were called by telephone at the time of interpretation on 06/02/2023 at 2:00 am to provider [facility] , who verbally acknowledged these results. Electronically Signed   By: Rise Mu M.D.   On: 06/02/2023 02:02   CT Head Wo Contrast Result Date: 06/01/2023 CLINICAL DATA:  Expressive aphasia onset 36-48 hours ago. EXAM: CT HEAD WITHOUT CONTRAST TECHNIQUE: Contiguous axial images were obtained from the base of the skull through the vertex without intravenous contrast. RADIATION DOSE REDUCTION: This exam was performed according to the departmental dose-optimization program which includes automated exposure control, adjustment of the mA and/or kV according to patient size and/or use of iterative reconstruction technique.  COMPARISON:  None Available. FINDINGS: Brain: The brain parenchyma above the level of the lateral ventricles is partially obscured by motion artifact and by metal streak artifact from multiple hair clips. There is mild global atrophy, mild small-vessel disease of the cerebral white matter. There are chronic appearing lacunar infarcts in both external capsules. Within study limitations no acute cortical based infarct, hemorrhage, mass or mass effect are seen. The ventricles are normal in size and position. The basal cisterns are clear. Incidentally seen is a 2.8 x 1.1 x 2.0 cm arachnoid cyst in the medial left temporal fossa. Vascular: The carotid siphons are heavily calcified. No hyperdense central vessel is seen. Skull: Negative for fractures or focal lesions. Sinuses/Orbits: Negative orbits aside from old lens extractions. There is mild membrane thickening in the ethmoid and maxillary sinuses. The frontal and sphenoid sinus, bilateral mastoid air cells, and middle ears are clear. The nasal septum is S shaped with right-sided spurring. There is a wax impaction in the right external auditory canal. Other: None. IMPRESSION: 1. No acute intracranial CT findings. Study limitations as above. 2. Atrophy and small-vessel disease. 3. 2.8 x 1.1 x 2.0 cm arachnoid cyst in the medial left temporal fossa. 4. Sinus membrane disease. 5. S shaped nasal septum with right-sided spurring. 6. Wax impaction in the right external auditory canal. Electronically Signed   By: Almira Bar M.D.   On: 06/01/2023 20:48    Microbiology: Results for orders placed or performed during the hospital encounter of 03/04/17  Surgical PCR screen     Status: None   Collection Time: 03/04/17  8:17 AM   Specimen: Nasal Mucosa; Nasal Swab  Result Value Ref Range Status   MRSA, PCR NEGATIVE NEGATIVE Final   Staphylococcus aureus NEGATIVE NEGATIVE Final    Comment: (NOTE) The Xpert SA Assay (FDA approved for NASAL specimens in patients  54 years of age and older), is one component of a comprehensive surveillance program. It is not intended to diagnose infection nor to guide or monitor treatment.     Labs: CBC: Recent Labs  Lab 06/01/23 1852  WBC 12.4*  HGB 14.8  HCT 46.8*  MCV 103.8*  PLT 214   Basic Metabolic Panel: Recent Labs  Lab 06/01/23 1852  NA 134*  K 4.3  CL 98  CO2 22  GLUCOSE 177*  BUN 21  CREATININE 1.05*  CALCIUM 9.2   Liver Function Tests: No results for input(s): "AST", "ALT", "ALKPHOS", "BILITOT", "PROT", "ALBUMIN" in the last 168 hours. CBG: No results for input(s): "GLUCAP" in the last 168 hours.  Discharge time spent: less than 30 minutes.  Signed: Rickey Barbara, MD [facility] 06/04/2023

## 2023-06-04 NOTE — TOC Transition Note (Signed)
Transition of Care [facility]) - Discharge Note   Patient Details  Name: Alexandria Wright MRN: [id number] Date of Birth: Dec 23, 1929  [facility] of [facility]) CM/SW Contact:  Kermit Balo, RN Phone Number: 06/04/2023, 11:54 AM   Clinical Narrative:     Pt is discharging home with home health services through [facility]. Information on the AVS. [facility] will contact her for the first home visit. Rollator for home provided by [employer] and was delivered to the room. Pt's family to provide recommended supervision at home.  Pt's family providing transportation home.  Final next level of care: Home w Home Health Services Barriers to Discharge: No Barriers Identified   Patient Goals and CMS Choice   CMS Medicare.gov Compare Post Acute Care list provided to:: Patient Represenative (must comment) Choice offered to / list presented to : Adult Children      Discharge Placement                       Discharge Plan and Services Additional resources added to the After Visit Summary for                  DME Arranged: Walker rolling with seat DME Agency: [employer] Date DME Agency Contacted: 06/04/23   Representative spoke with at DME Agency: dc lounge HH Arranged: PT, OT, Speech Therapy HH Agency: [facility] Date [facility] Agency Contacted: 06/04/23   Representative spoke with at [facility] Agency: Haywood Lasso  Social Drivers of Health (SDOH) Interventions SDOH Screenings   Food Insecurity: No Food Insecurity (06/02/2023)  Housing: Low Risk  (06/02/2023)  Transportation Needs: No Transportation Needs (06/02/2023)  Utilities: Not At Risk (06/02/2023)  Depression (PHQ2-9): Low Risk  (11/17/2022)  Financial Resource Strain: Low Risk  (09/21/2017)  Physical Activity: Sufficiently Active (09/21/2017)  Social Connections: Moderately Integrated (06/02/2023)  Stress: No Stress Concern Present (09/21/2017)  Tobacco Use: Low Risk  (06/01/2023)     Readmission Risk Interventions      No data to display

## 2023-06-04 NOTE — Progress Notes (Signed)
Occupational Therapy Treatment Patient Details Name: Alexandria Wright MRN: [id number] DOB: [birth year] Today's Date: 06/04/2023   History of present illness Alexandria Wright is a 89 y.o. female presenting to the ED with new onset of aphasia and positive CT scan for thrombus within a proximal left M2 branch,with medical history significant of SND s/p PPM placement, bradycardia GERD.    .   OT comments  Pt completed functional transfers and mobility with use of the rollator in the hallway and in the therapy gym.  Pt still overall supervision level for safety.  Making great progress and planned discharge home today with family with 24 hour supervision.  Recommend continued HHOT to progress back to modified independent baseline.        If plan is discharge home, recommend the following:  A little help with walking and/or transfers;A little help with bathing/dressing/bathroom;Assistance with cooking/housework;Assist for transportation;Help with stairs or ramp for entrance   Equipment Recommendations  Other (comment) (tub bench which family will look to purchase from outside source.)       Precautions / Restrictions Precautions Precautions: Fall Restrictions Weight Bearing Restrictions Per Provider Order: No       Mobility Bed Mobility Overal bed mobility: Needs Assistance Bed Mobility: Supine to Sit, Sit to Supine     Supine to sit: Modified independent (Device/Increase time) Sit to supine: Modified independent (Device/Increase time)        Transfers Overall transfer level: Needs assistance Equipment used: Standard walker, Rollator (4 wheels) Transfers: Sit to/from Stand, Bed to chair/wheelchair/BSC Sit to Stand: Supervision     Step pivot transfers: Supervision     General transfer comment: Supervision for ambulation with use of the rollator.     Balance Overall balance assessment: Needs assistance Sitting-balance support: No upper extremity supported, Feet  unsupported Sitting balance-Leahy Scale: Good     Standing balance support: During functional activity Standing balance-Leahy Scale: Fair Standing balance comment: Pt needs UE support for balance with mobility.                           ADL either performed or assessed with clinical judgement   ADL Overall ADL's : Needs assistance/impaired Eating/Feeding: Independent;Sitting   Grooming: Oral care;Supervision/safety;Standing                   Toilet Transfer: Supervision/safety;Ambulation;Rollator (4 wheels)   Toileting- Architect and Hygiene: Supervision/safety;Sit to/from stand   Tub/ Shower Transfer: Tub transfer;Supervision/safety;Ambulation;Shower seat;Tub bench   Functional mobility during ADLs: Supervision/safety;Rollator (4 wheels) General ADL Comments: Provided education on rollator use with transfers and mobility.  Pt needs mod demonstrational cueing for hand placement and to remember to lock and unlock the brakes when transitioning to sitting and standing.  Discussed and practiced tub transfers with use of the tub seat and bench.  She was able to complete both but this therapist recommends use of the shower bench vs seat for increased safety.  This was also communicated with the pt's neice after session had concluded and they will look at purchasing from outside source.     Vision Baseline Vision/History: 0 No visual deficits Ability to See in Adequate Light: 0 Adequate            Cognition Arousal: Alert Behavior During Therapy: WFL for tasks assessed/performed Overall Cognitive Status: Impaired/Different from baseline Area of Impairment: Safety/judgement, Memory  Memory: Decreased short-term memory   Safety/Judgement: Decreased awareness of safety     General Comments: Pt needed mod instructional/demonstrational cueing for rollator usage to lock and unlock brakes appropriately and for hand placement  with sit to stand and stand to sit.                   Pertinent Vitals/ Pain       Pain Assessment Pain Assessment: No/denies pain         Frequency  Min 1X/week        Progress Toward Goals  OT Goals(current goals can now be found in the care plan section)  Progress towards OT goals: Progressing toward goals  Acute Rehab OT Goals Patient Stated Goal: Pt looking forward to going home today OT Goal Formulation: With patient Time For Goal Achievement: 06/15/23 Potential to Achieve Goals: Good  Plan         AM-PAC OT "6 Clicks" Daily Activity     Outcome Measure   Help from another person eating meals?: None Help from another person taking care of personal grooming?: None Help from another person toileting, which includes using toliet, bedpan, or urinal?: A Little Help from another person bathing (including washing, rinsing, drying)?: A Little Help from another person to put on and taking off regular upper body clothing?: A Little Help from another person to put on and taking off regular lower body clothing?: A Little 6 Click Score: 20    End of Session Equipment Utilized During Treatment: Rollator (4 wheels)  OT Visit Diagnosis: Unsteadiness on feet (R26.81);Other abnormalities of gait and mobility (R26.89);Muscle weakness (generalized) (M62.81);Cognitive communication deficit (R41.841) Symptoms and signs involving cognitive functions: Cerebral infarction   Activity Tolerance Patient tolerated treatment well   Patient Left in bed;with call bell/phone within reach;with nursing/sitter in room   Nurse Communication Mobility status        Time: 4782-9562 OT Time Calculation (min): 31 min  Charges: OT General Charges $OT Visit: 1 Visit OT Treatments $Self Care/Home Management : 23-37 mins  Perrin Maltese, OTR/L Acute Rehabilitation Services  Office [phone] 06/04/2023

## 2023-06-04 NOTE — Progress Notes (Signed)
Pt being d/c VSS, IV removed, education complete.   Balinda Quails, RN 06/04/2023 12:21 PM

## 2023-06-05 DIAGNOSIS — K219 Gastro-esophageal reflux disease without esophagitis: Secondary | ICD-10-CM | POA: Diagnosis not present

## 2023-06-05 DIAGNOSIS — I10 Essential (primary) hypertension: Secondary | ICD-10-CM | POA: Diagnosis not present

## 2023-06-05 DIAGNOSIS — I69322 Dysarthria following cerebral infarction: Secondary | ICD-10-CM | POA: Diagnosis not present

## 2023-06-05 DIAGNOSIS — M810 Age-related osteoporosis without current pathological fracture: Secondary | ICD-10-CM | POA: Diagnosis not present

## 2023-06-05 DIAGNOSIS — I69393 Ataxia following cerebral infarction: Secondary | ICD-10-CM | POA: Diagnosis not present

## 2023-06-05 DIAGNOSIS — J189 Pneumonia, unspecified organism: Secondary | ICD-10-CM | POA: Diagnosis not present

## 2023-06-05 DIAGNOSIS — I69331 Monoplegia of upper limb following cerebral infarction affecting right dominant side: Secondary | ICD-10-CM | POA: Diagnosis not present

## 2023-06-05 DIAGNOSIS — E785 Hyperlipidemia, unspecified: Secondary | ICD-10-CM | POA: Diagnosis not present

## 2023-06-05 DIAGNOSIS — H9190 Unspecified hearing loss, unspecified ear: Secondary | ICD-10-CM | POA: Diagnosis not present

## 2023-06-10 DIAGNOSIS — E785 Hyperlipidemia, unspecified: Secondary | ICD-10-CM | POA: Diagnosis not present

## 2023-06-10 DIAGNOSIS — H9190 Unspecified hearing loss, unspecified ear: Secondary | ICD-10-CM | POA: Diagnosis not present

## 2023-06-10 DIAGNOSIS — J189 Pneumonia, unspecified organism: Secondary | ICD-10-CM | POA: Diagnosis not present

## 2023-06-10 DIAGNOSIS — I69393 Ataxia following cerebral infarction: Secondary | ICD-10-CM | POA: Diagnosis not present

## 2023-06-10 DIAGNOSIS — M810 Age-related osteoporosis without current pathological fracture: Secondary | ICD-10-CM | POA: Diagnosis not present

## 2023-06-10 DIAGNOSIS — I69331 Monoplegia of upper limb following cerebral infarction affecting right dominant side: Secondary | ICD-10-CM | POA: Diagnosis not present

## 2023-06-10 DIAGNOSIS — K219 Gastro-esophageal reflux disease without esophagitis: Secondary | ICD-10-CM | POA: Diagnosis not present

## 2023-06-10 DIAGNOSIS — I10 Essential (primary) hypertension: Secondary | ICD-10-CM | POA: Diagnosis not present

## 2023-06-10 DIAGNOSIS — I69322 Dysarthria following cerebral infarction: Secondary | ICD-10-CM | POA: Diagnosis not present

## 2023-06-12 DIAGNOSIS — I69393 Ataxia following cerebral infarction: Secondary | ICD-10-CM | POA: Diagnosis not present

## 2023-06-12 DIAGNOSIS — J189 Pneumonia, unspecified organism: Secondary | ICD-10-CM | POA: Diagnosis not present

## 2023-06-12 DIAGNOSIS — I10 Essential (primary) hypertension: Secondary | ICD-10-CM | POA: Diagnosis not present

## 2023-06-12 DIAGNOSIS — E785 Hyperlipidemia, unspecified: Secondary | ICD-10-CM | POA: Diagnosis not present

## 2023-06-12 DIAGNOSIS — K219 Gastro-esophageal reflux disease without esophagitis: Secondary | ICD-10-CM | POA: Diagnosis not present

## 2023-06-12 DIAGNOSIS — I69331 Monoplegia of upper limb following cerebral infarction affecting right dominant side: Secondary | ICD-10-CM | POA: Diagnosis not present

## 2023-06-12 DIAGNOSIS — I69322 Dysarthria following cerebral infarction: Secondary | ICD-10-CM | POA: Diagnosis not present

## 2023-06-12 DIAGNOSIS — M810 Age-related osteoporosis without current pathological fracture: Secondary | ICD-10-CM | POA: Diagnosis not present

## 2023-06-12 DIAGNOSIS — H9190 Unspecified hearing loss, unspecified ear: Secondary | ICD-10-CM | POA: Diagnosis not present

## 2023-06-15 ENCOUNTER — Telehealth: Payer: Self-pay

## 2023-06-15 DIAGNOSIS — I10 Essential (primary) hypertension: Secondary | ICD-10-CM

## 2023-06-15 NOTE — Patient Outreach (Signed)
  Emmi Stroke Care Coordination Follow Up  06/15/2023 Name:  Alexandria Wright MRN:  [id number] DOB:  [birth year]  Subjective: Alexandria Wright is a 89 y.o. year old female who is a primary care patient of Kermit Balo, DO An Emmi alert was received indicating patient responded to questions: Feeling worse overall?. I reached out by phone to follow up on the alert and spoke to Patient.  Care Coordination Interventions:  Yes, provided   Follow up plan: No further intervention required. Patient declines further calls. States she did not report feeling worse. States that she is doing well.   Encounter Outcome:  Patient Visit Completed   Lonia Chimera, RN, BSN, CEN Population Health- Transition of Care Team.  [facility] [phone]

## 2023-06-17 DIAGNOSIS — I69331 Monoplegia of upper limb following cerebral infarction affecting right dominant side: Secondary | ICD-10-CM | POA: Diagnosis not present

## 2023-06-17 DIAGNOSIS — J189 Pneumonia, unspecified organism: Secondary | ICD-10-CM | POA: Diagnosis not present

## 2023-06-17 DIAGNOSIS — I10 Essential (primary) hypertension: Secondary | ICD-10-CM | POA: Diagnosis not present

## 2023-06-17 DIAGNOSIS — I69322 Dysarthria following cerebral infarction: Secondary | ICD-10-CM | POA: Diagnosis not present

## 2023-06-17 DIAGNOSIS — H9190 Unspecified hearing loss, unspecified ear: Secondary | ICD-10-CM | POA: Diagnosis not present

## 2023-06-17 DIAGNOSIS — I69393 Ataxia following cerebral infarction: Secondary | ICD-10-CM | POA: Diagnosis not present

## 2023-06-17 DIAGNOSIS — K219 Gastro-esophageal reflux disease without esophagitis: Secondary | ICD-10-CM | POA: Diagnosis not present

## 2023-06-17 DIAGNOSIS — E785 Hyperlipidemia, unspecified: Secondary | ICD-10-CM | POA: Diagnosis not present

## 2023-06-17 DIAGNOSIS — M810 Age-related osteoporosis without current pathological fracture: Secondary | ICD-10-CM | POA: Diagnosis not present

## 2023-06-19 ENCOUNTER — Encounter: Payer: Self-pay | Admitting: Nurse Practitioner

## 2023-06-22 ENCOUNTER — Encounter: Payer: Self-pay | Admitting: Nurse Practitioner

## 2023-06-22 ENCOUNTER — Ambulatory Visit (INDEPENDENT_AMBULATORY_CARE_PROVIDER_SITE_OTHER): Payer: Medicare PPO | Admitting: Nurse Practitioner

## 2023-06-22 ENCOUNTER — Ambulatory Visit: Payer: Medicare PPO

## 2023-06-22 VITALS — BP 110/70 | HR 66 | Temp 97.3°F | Ht 60.0 in | Wt 81.0 lb

## 2023-06-22 DIAGNOSIS — Z8673 Personal history of transient ischemic attack (TIA), and cerebral infarction without residual deficits: Secondary | ICD-10-CM | POA: Diagnosis not present

## 2023-06-22 DIAGNOSIS — R1310 Dysphagia, unspecified: Secondary | ICD-10-CM | POA: Diagnosis not present

## 2023-06-22 DIAGNOSIS — I495 Sick sinus syndrome: Secondary | ICD-10-CM

## 2023-06-22 DIAGNOSIS — R636 Underweight: Secondary | ICD-10-CM | POA: Diagnosis not present

## 2023-06-22 DIAGNOSIS — E7800 Pure hypercholesterolemia, unspecified: Secondary | ICD-10-CM

## 2023-06-22 MED ORDER — ROSUVASTATIN CALCIUM 10 MG PO TABS: 10.0000 mg | ORAL_TABLET | Freq: Every day | ORAL | 0 refills | Status: DC

## 2023-06-22 MED ORDER — CLOPIDOGREL BISULFATE 75 MG PO TABS: 75.0000 mg | ORAL_TABLET | Freq: Every day | ORAL | 0 refills | Status: DC

## 2023-06-22 NOTE — Progress Notes (Signed)
 Careteam: Patient Care Team: Verma Gobble, NP as PCP - General (Geriatric Medicine) Rudine Cos, MD as Consulting Physician (Ophthalmology)  PLACE OF SERVICE:  [facility]  Advanced Directive information    Allergies  Allergen Reactions   Sulfonamide Derivatives Other (See Comments)    "Made me very sick," per the patient    Penicillins Swelling and Rash    Has patient had a PCN reaction causing immediate rash, facial/tongue/throat swelling, SOB or lightheadedness with hypotension: Yes Has patient had a PCN reaction causing severe rash involving mucus membranes or skin necrosis: Yes Has patient had a PCN reaction that required hospitalization: No Has patient had a PCN reaction occurring within the last 10 years: No If all of the above answers are "NO", then may proceed with Cephalosporin use.     Chief Complaint  Patient presents with   Hospitalization Follow-up    Hospital follow up      HPI: Patient is a 89 y.o. female who presents today for a hospital follow up. On 1/20 she went to the hospital for acute aphasia, slurred speech and right sided weakness. Initial CT head negative on 1/20. Repeat CT head on 1/21 showed an acute left MCA distribution infarct. She was not a candidate for thrombectomy due to timing and her age/frailty. Neurology recommended Aspirin  81 mg daily and Plavix  75 mg daily for 3 months and then Aspirin  81 mg daily alone after that. Due to elevated cholesterol levels she was started on Crestor  as well. Ultrasound negative for DVTs.   She also was found to have pneumonia on CT neck and was discharged on azithromycin  and cefdinir  which she states she has now completed. No ongoing cough or congestion   Cardiology was also consulted due to her pacemaker and she had been cleared after [employer] records were reviewed. Echocardiogram shows EF 60-65%.  She has a follow up appointment scheduled with Neurology later this month.  She was discharged with  with Home Health care services from [facility] and a rolling walker. She states she is not using her rolling walker and has no deficit due to recent CVA  Modified Barium Swallow Study evaluation on 1/21 recommends a Dysphagia 1 pureed diet with moderately thick honey thick liquids. Patient states she is drinking thin liquids and normal consistency foods (not pureed). She denies any coughing with eating or drinking. She declines any changes to her diet at this time.   Review of Systems:  Review of Systems  Constitutional: Negative.   HENT:  Positive for hearing loss.   Eyes: Negative.   Respiratory:  Positive for cough.   Cardiovascular: Negative.   Gastrointestinal: Negative.   Genitourinary: Negative.   Musculoskeletal: Negative.   Skin: Negative.   Neurological: Negative.   Endo/Heme/Allergies: Negative.   Psychiatric/Behavioral: Negative.      Past Medical History:  Diagnosis Date   Bradycardia    a. s/p MDT dual chamber PPM    Esophageal stricture    GERD (gastroesophageal reflux disease)    Skin cancer    Syncope    Tachycardia    asyptomatic nonsustained   Past Surgical History:  Procedure Laterality Date   CATARACT EXTRACTION  12/10/2011   right    CATARACT EXTRACTION  12/17/2011   left   PACEMAKER PLACEMENT  2008   MDT dual chamber PPM implanted by Dr Rodolfo Clan for symptomatic bradycardia   PPM GENERATOR CHANGEOUT N/A 03/04/2017   Procedure: PPM GENERATOR CHANGEOUT;  Surgeon: Verona Goodwill,  MD;  Location: [facility] INVASIVE CV LAB;  Service: Cardiovascular;  Laterality: N/A;   SKIN CANCER EXCISION     TONSILLECTOMY AND ADENOIDECTOMY     Social History:   reports that she has never smoked. She has never used smokeless tobacco. She reports that she does not drink alcohol and does not use drugs.  Family History  Problem Relation Age of Onset   Heart disease Mother    Colon cancer Neg Hx     Medications: Patient's Medications  New Prescriptions   No  medications on file  Previous Medications   ASPIRIN  81 MG CHEWABLE TABLET    Chew 1 tablet (81 mg total) by mouth daily.   CALCIUM  CARBONATE-VITAMIN D (OSCAL 500/200 D-3 PO)    Take 1 tablet by mouth daily with breakfast.   CHOLECALCIFEROL (VITAMIN D3) 50 MCG (2000 UT) TABS    Take 2,000 Units by mouth daily.   HOMEOPATHIC PRODUCTS (SIMILASAN DRY EYE RELIEF OP)    Place 1 drop into both eyes 3 (three) times daily as needed (for irritation).   MULTIPLE VITAMIN (MULTIVITAMIN WITH MINERALS) TABS TABLET    Take 1 tablet by mouth daily with breakfast.  Modified Medications   Modified Medication Previous Medication   CLOPIDOGREL  (PLAVIX ) 75 MG TABLET clopidogrel  (PLAVIX ) 75 MG tablet      Take 1 tablet (75 mg total) by mouth daily.    Take 1 tablet (75 mg total) by mouth daily.   ROSUVASTATIN  (CRESTOR ) 10 MG TABLET rosuvastatin  (CRESTOR ) 10 MG tablet      Take 1 tablet (10 mg total) by mouth daily.    Take 1 tablet (10 mg total) by mouth daily.  Discontinued Medications   No medications on file    Physical Exam:  Vitals:   06/19/23 1604  BP: 110/70  Pulse: 66  Temp: (!) 97.3 F (36.3 C)  TempSrc: Temporal  SpO2: 99%  Weight: 81 lb (36.7 kg)  Height: 5' (1.524 m)   Body mass index is 15.82 kg/m. Wt Readings from Last 3 Encounters:  06/19/23 81 lb (36.7 kg)  06/01/23 90 lb (40.8 kg)  12/08/22 81 lb 12.8 oz (37.1 kg)    Physical Exam Vitals reviewed.  Constitutional:      Appearance: She is underweight.  HENT:     Head: Normocephalic and atraumatic.     Right Ear: External ear normal.     Left Ear: External ear normal.     Nose: Nose normal.     Mouth/Throat:     Mouth: Mucous membranes are moist.     Pharynx: Oropharynx is clear.  Cardiovascular:     Rate and Rhythm: Normal rate and regular rhythm.     Pulses: Normal pulses.     Heart sounds: Normal heart sounds.  Pulmonary:     Effort: Pulmonary effort is normal.     Breath sounds: Normal breath sounds.  Abdominal:      General: Abdomen is flat. Bowel sounds are normal.  Musculoskeletal:        General: Normal range of motion.     Cervical back: Normal range of motion.  Skin:    General: Skin is warm and dry.     Findings: Bruising present.  Neurological:     General: No focal deficit present.     Mental Status: She is alert and oriented to person, place, and time. Mental status is at baseline.  Psychiatric:        Mood and Affect: Mood normal.  Speech: Speech normal.        Cognition and Memory: She exhibits impaired recent memory.    Labs reviewed: Basic Metabolic Panel: Recent Labs    11/17/22 0936 06/01/23 1852  NA 138 134*  K 4.2 4.3  CL 99 98  CO2 32 22  GLUCOSE 101* 177*  BUN 17 21  CREATININE 0.83 1.05*  CALCIUM  9.5 9.2   Liver Function Tests: Recent Labs    11/17/22 0936  AST 14  ALT 8  BILITOT 0.6  PROT 6.7   No results for input(s): "LIPASE", "AMYLASE" in the last 8760 hours. No results for input(s): "AMMONIA" in the last 8760 hours. CBC: Recent Labs    11/17/22 0936 06/01/23 1852  WBC 9.6 12.4*  NEUTROABS 7,728  --   HGB 14.6 14.8  HCT 43.6 46.8*  MCV 97.1 103.8*  PLT 249 214   Lipid Panel: Recent Labs    11/17/22 0936 06/01/23 1852 06/02/23 1346  CHOL 189 206* 210*  HDL 64 75 69  LDLCALC 104* 114* 121*  TRIG 116 86 98  CHOLHDL 3.0 2.7 3.0   TSH: No results for input(s): "TSH" in the last 8760 hours. A1C: Lab Results  Component Value Date   HGBA1C 5.3 06/02/2023    Assessment/Plan 1. Dysphagia, unspecified type (Primary) - Modified Barium Swallow Study evaluation on 1/21 recommends a Dysphagia 1 pureed diet with moderately thick honey thick liquids. - Patient declines diet recommendations and  Educated patient on risks of aspiration pneumonia   2. History of CVA (cerebrovascular accident) - stable no ongoing deficit.  -Keep Neurology appointment in February. - Continue Aspirin  81 mg and Plavix  as prescribed. - Continue Home  Health services.  3. Pure hypercholesterolemia - Continue taking Crestor  as prescribed. - Recheck lipid panel in 3 months.   4. Underweight - Continue eating high protein rich foods in recommended dysphagia 1 pureed diet - Continue protein shakes in recommended honey thick consistency.  5. Tachy-brady syndrome (HCC) - Continue scheduled pacemaker checks and annual cardiology appointment. - Stable, pt denies any dizziness and lightheadedness.   Follow up in 3 months with labs.   Darliss Ek, RN DNP-AGPCNP Student -I personally was present during the history, physical exam and medical decision-making activities of this service and have verified that the service and findings are accurately documented in the student's note Naw Lasala K. Denney Fisherman [facility] [phone]

## 2023-06-25 LAB — CUP PACEART REMOTE DEVICE CHECK
Battery Impedance: 1010 Ohm
Battery Remaining Longevity: 71 mo
Battery Voltage: 2.76 V
Brady Statistic AP VP Percent: 0 %
Brady Statistic AP VS Percent: 2 %
Brady Statistic AS VP Percent: 0 %
Brady Statistic AS VS Percent: 97 %
Date Time Interrogation Session: 20250212100402
Implantable Lead Connection Status: 753985
Implantable Lead Connection Status: 753985
Implantable Lead Implant Date: 20080703
Implantable Lead Implant Date: 20080703
Implantable Lead Location: 753859
Implantable Lead Location: 753860
Implantable Lead Model: 5076
Implantable Lead Model: 5076
Implantable Pulse Generator Implant Date: 20181024
Lead Channel Impedance Value: 488 Ohm
Lead Channel Impedance Value: 502 Ohm
Lead Channel Pacing Threshold Amplitude: 0.625 V
Lead Channel Pacing Threshold Amplitude: 0.75 V
Lead Channel Pacing Threshold Pulse Width: 0.4 ms
Lead Channel Pacing Threshold Pulse Width: 0.4 ms
Lead Channel Setting Pacing Amplitude: 1.5 V
Lead Channel Setting Pacing Amplitude: 4 V
Lead Channel Setting Pacing Pulse Width: 0.64 ms
Lead Channel Setting Sensing Sensitivity: 5.6 mV
Zone Setting Status: 755011
Zone Setting Status: 755011

## 2023-06-30 ENCOUNTER — Telehealth: Payer: Self-pay

## 2023-06-30 ENCOUNTER — Other Ambulatory Visit: Payer: Self-pay | Admitting: Nurse Practitioner

## 2023-06-30 DIAGNOSIS — Z8673 Personal history of transient ischemic attack (TIA), and cerebral infarction without residual deficits: Secondary | ICD-10-CM

## 2023-06-30 NOTE — Telephone Encounter (Signed)
Clydie Braun called back questioning why crestor was not refilled at the same time as plavix.  Research: it appears that both rx's were sent at the same time, outgoing call placed to the pharmacy to question why rx was not filled at the same time. Per the pharmacist t was too early to fill on  06/22/23, however rx ran today and can now be filled.  I returned call to Clydie Braun and informed her of the above conversation. Clydie Braun then stated that Ms.Profitt has taken 2 tablets of clopidogrel 2 days in a row and she would like to know if action is required (should patient be evaluated)? Please advise

## 2023-06-30 NOTE — Telephone Encounter (Signed)
Message left on clinical intake voicemail:   Vague message was left by Alexandria Wright requesting a return call to discuss medication. I returned call to Alexandria Wright requesting a return call and suggested that she leave more details if voicemail received

## 2023-07-01 NOTE — Telephone Encounter (Signed)
Spoke with [place] @ [phone] and discussed Sharon Seller, NP response

## 2023-07-01 NOTE — Telephone Encounter (Signed)
No just hold for today and restart

## 2023-07-01 NOTE — Telephone Encounter (Signed)
RX was recently approved by Sharon Seller, NP

## 2023-07-07 NOTE — Progress Notes (Unsigned)
 [facility] [address] [place]. [place] [address] [phone]       HOSPITAL FOLLOW UP NOTE  [name] Date of Birth:  11-23-1929 Medical Record Number:  [id number]   Reason for Referral:  hospital stroke follow up    SUBJECTIVE:   CHIEF COMPLAINT:  No chief complaint on file.   HPI:   [name] is a 89 y.o. female with past medical history of bradycardia status post pacemaker, skin cancer, GERD, syncope who presented to ED on 06/01/2023 due to acute onset of slurred speech right side coordination issues and aphasia.  Stroke workup revealed left insular cortex infarct due to left M2 thrombus likely secondary to small vessel disease.  Pacemaker interrogation no evidence of A-fib.  LDL 121.  A1c 5.3.  Recommended DAPT for 3 months then aspirin alone and added Crestor 10 mg daily.  Residual deficits of speech hesitancy, dysphagia and right hemiparesis.  Therapies recommended home health therapy.        PERTINENT IMAGING  CT Head: No acute intracranial CT findings  CTA head & neck Acute subocclusive thrombus within a proximal left M2 branch, inferior division. MCA branches are patent and perfused distally. Atheromatous change about the carotid bifurcations with up to 50% stenosis bilaterally. MRI: unable to complete MRI as pacemaker is not-compatible CT head repeat  Small focal hypodensity now seen involving the left insular cortex, consistent with a small evolving acute left MCA distribution infarct.  VAS [place] LE: Negative DVT Pacemaker interrogation no A-fib 2D Echo: LVEF 60 to 65%  LDL 121 HgbA1c 5.3    ROS:   14 system review of systems performed and negative with exception of ***  PMH:  Past Medical History:  Diagnosis Date   Bradycardia    a. s/p MDT dual chamber PPM    Esophageal stricture    GERD (gastroesophageal reflux disease)    Skin cancer    Syncope    Tachycardia    asyptomatic nonsustained    PSH:   Past Surgical History:  Procedure Laterality Date   CATARACT EXTRACTION  12/10/2011   right    CATARACT EXTRACTION  12/17/2011   left   PACEMAKER PLACEMENT  07-24-2006   MDT dual chamber PPM implanted by Dr Graciela Husbands for symptomatic bradycardia   PPM GENERATOR CHANGEOUT N/A 03/04/2017   Procedure: PPM GENERATOR CHANGEOUT;  Surgeon: Duke Salvia, MD;  Location: [facility] INVASIVE CV LAB;  Service: Cardiovascular;  Laterality: N/A;   SKIN CANCER EXCISION     TONSILLECTOMY AND ADENOIDECTOMY      Social History:  Social History   Socioeconomic History   Marital status: Widowed    Spouse name: Not on file   Number of children: 0   Years of education: Not on file   Highest education level: Not on file  Occupational History   Occupation: retired Runner, broadcasting/film/video  Tobacco Use   Smoking status: Never   Smokeless tobacco: Never  Vaping Use   Vaping status: Never Used  Substance and Sexual Activity   Alcohol use: No   Drug use: No   Sexual activity: Not on file  Other Topics Concern   Not on file  Social History Narrative   Widow -husband died 2003-07-24   Lives alone   Never smoked   Alcohol none   Walks 8 miles daily 5 days a week    Living Will   Social Drivers of Health   Financial Resource Strain: Low Risk  (09/21/2017)  Overall Financial Resource Strain (CARDIA)    Difficulty of Paying Living Expenses: Not hard at all  Food Insecurity: No Food Insecurity (06/02/2023)   Hunger Vital Sign    Worried About Running Out of Food in the Last Year: Never true    Ran Out of Food in the Last Year: Never true  Transportation Needs: No Transportation Needs (06/02/2023)   PRAPARE - Administrator, Civil Service (Medical): No    Lack of Transportation (Non-Medical): No  Physical Activity: Sufficiently Active (09/21/2017)   Exercise Vital Sign    Days of Exercise per Week: 7 days    Minutes of Exercise per Session: 150+ min  Stress: No Stress Concern Present (09/21/2017)   [employer] of  Occupational Health - Occupational Stress Questionnaire    Feeling of Stress : Not at all  Social Connections: Moderately Integrated (06/02/2023)   Social Connection and Isolation Panel [NHANES]    Frequency of Communication with Friends and Family: More than three times a week    Frequency of Social Gatherings with Friends and Family: Never    Attends Religious Services: More than 4 times per year    Active Member of [employer] or Organizations: Yes    Attends Banker Meetings: Never    Marital Status: Widowed  Intimate Partner Violence: Not At Risk (06/02/2023)   Humiliation, Afraid, Rape, and Kick questionnaire    Fear of Current or Ex-Partner: No    Emotionally Abused: No    Physically Abused: No    Sexually Abused: No    Family History:  Family History  Problem Relation Age of Onset   Heart disease Mother    Colon cancer Neg Hx     Medications:   Current Outpatient Medications on File Prior to Visit  Medication Sig Dispense Refill   aspirin 81 MG chewable tablet Chew 1 tablet (81 mg total) by mouth daily. 90 tablet 0   Calcium Carbonate-Vitamin D (OSCAL 500/200 D-3 PO) Take 1 tablet by mouth daily with breakfast.     Cholecalciferol (VITAMIN D3) 50 MCG (2000 UT) TABS Take 2,000 Units by mouth daily.     clopidogrel (PLAVIX) 75 MG tablet Take 1 tablet (75 mg total) by mouth daily. 60 tablet 0   Homeopathic Products (SIMILASAN DRY EYE RELIEF OP) Place 1 drop into both eyes 3 (three) times daily as needed (for irritation).     Multiple Vitamin (MULTIVITAMIN WITH MINERALS) TABS tablet Take 1 tablet by mouth daily with breakfast.     rosuvastatin (CRESTOR) 10 MG tablet Take 1 tablet (10 mg total) by mouth daily. 30 tablet 0   No current facility-administered medications on file prior to visit.    Allergies:   Allergies  Allergen Reactions   Sulfonamide Derivatives Other (See Comments)    "Made me very sick," per the patient    Penicillins Swelling and Rash    Has  patient had a PCN reaction causing immediate rash, facial/tongue/throat swelling, SOB or lightheadedness with hypotension: Yes Has patient had a PCN reaction causing severe rash involving mucus membranes or skin necrosis: Yes Has patient had a PCN reaction that required hospitalization: No Has patient had a PCN reaction occurring within the last 10 years: No If all of the above answers are "NO", then may proceed with Cephalosporin use.       OBJECTIVE:  Physical Exam  There were no vitals filed for this visit. There is no height or weight on file to calculate BMI.  No results found.   General: well developed, well nourished, seated, in no evident distress Head: head normocephalic and atraumatic.   Neck: supple with no carotid or supraclavicular bruits Cardiovascular: regular rate and rhythm, no murmurs Musculoskeletal: no deformity Skin:  no rash/petichiae Vascular:  Normal pulses all extremities   Neurologic Exam Mental Status: Awake and fully alert. Oriented to place and time. Recent and remote memory intact. Attention span, concentration and fund of knowledge appropriate. Mood and affect appropriate.  Cranial Nerves: Fundoscopic exam reveals sharp disc margins. Pupils equal, briskly reactive to light. Extraocular movements full without nystagmus. Visual fields full to confrontation. Hearing intact. Facial sensation intact. Face, tongue, palate moves normally and symmetrically.  Motor: Normal bulk and tone. Normal strength in all tested extremity muscles Sensory.: intact to touch , pinprick , position and vibratory sensation.  Coordination: Rapid alternating movements normal in all extremities. Finger-to-nose and heel-to-shin performed accurately bilaterally. Gait and Station: Arises from chair without difficulty. Stance is normal. Gait demonstrates normal stride length and balance with ***. Tandem walk and heel toe ***.  Reflexes: 1+ and symmetric. Toes downgoing.     NIHSS   *** Modified Rankin  ***      ASSESSMENT: Alexandria Wright is a 89 y.o. year old female with left insular cortex infarct on 06/01/2023 due to left M2 thrombus likely secondary to large vessel disease. Vascular risk factors include hx of bradycardia s/p pacer, HLD, bilateral carotid stenosis, and advanced age.      PLAN:  Ischemic stroke:  Residual deficit: ***.  Continue aspirin 81mg  daily and Plavix for additional 2 months then aspirin alone and rosuvastatin (Crestor) 10 mg daily for secondary stroke prevention managed/prescribed by PCP.   Discussed secondary stroke prevention measures and importance of close PCP follow up for aggressive stroke risk factor management including BP goal<130/90, HLD with LDL goal<70 and DM with A1c.<7 .  Stroke labs 05/2023: LDL 121, A1c 5.3 I have gone over the pathophysiology of stroke, warning signs and symptoms, risk factors and their management in some detail with instructions to go to the closest emergency room for symptoms of concern. Carotid stenosis:  CTA neck 05/2023 carotid bifurcations up to 50% stenosis bilaterally Recommend carotid ultrasound around 11/2023    Follow up in *** or call earlier if needed   CC:  [facility] provider: Dr. Pearlean Brownie PCP: Sharon Seller, NP    I spent *** minutes of face-to-face and non-face-to-face time with patient.  This included previsit chart review including review of recent hospitalization, lab review, study review, order entry, electronic health record documentation, patient education regarding recent stroke including etiology, secondary stroke prevention measures and importance of managing stroke risk factors, residual deficits and typical recovery time and answered all other questions to patient satisfaction   Ihor Austin, AGNP-BC  [facility] [address] [place], [place] [address]  Phone [phone] Fax [phone] Note: This document was prepared with digital  dictation and possible smart phrase technology. Any transcriptional errors that result from this process are unintentional.

## 2023-07-08 ENCOUNTER — Ambulatory Visit: Payer: Medicare PPO | Admitting: Adult Health

## 2023-07-08 ENCOUNTER — Encounter: Payer: Self-pay | Admitting: Adult Health

## 2023-07-08 VITALS — BP 110/61 | HR 65 | Ht 65.0 in | Wt 80.0 lb

## 2023-07-08 DIAGNOSIS — I639 Cerebral infarction, unspecified: Secondary | ICD-10-CM | POA: Diagnosis not present

## 2023-07-08 DIAGNOSIS — Z09 Encounter for follow-up examination after completed treatment for conditions other than malignant neoplasm: Secondary | ICD-10-CM

## 2023-07-08 NOTE — Patient Instructions (Addendum)
 Continue to do exercises at home as advised by therapy  Continue aspirin 81 mg daily and clopidogrel for 2 more months then just aspirin alone and continue Crestor 10mg  daily  for secondary stroke prevention - refills can be obtained by your PCP  Continue to follow up with PCP regarding blood pressure and cholesterol management  Maintain strict control of hypertension with blood pressure goal below 130/90 and cholesterol with LDL cholesterol (bad cholesterol) goal below 70 mg/dL.   Signs of a Stroke? Follow the BEFAST method:  Balance Watch for a sudden loss of balance, trouble with coordination or vertigo Eyes Is there a sudden loss of vision in one or both eyes? Or double vision?  Face: Ask the person to smile. Does one side of the face droop or is it numb?  Arms: Ask the person to raise both arms. Does one arm drift downward? Is there weakness or numbness of a leg? Speech: Ask the person to repeat a simple phrase. Does the speech sound slurred/strange? Is the person confused ? Time: If you observe any of these signs, call 911.    As you have been doing well from a stroke standpoint and are closely followed by your PCP, you can follow up with [place] as needed but please do not hesitate to call with any questions or concerns in the future.     Thank you for coming to see [place] at [facility]. I hope we have been able to provide you high quality care today.  You may receive a patient satisfaction survey over the next few weeks. We would appreciate your feedback and comments so that we may continue to improve ourselves and the health of our patients.    Stroke Prevention Some medical conditions and behaviors can lead to a higher chance of having a stroke. You can help prevent a stroke by eating healthy, exercising, not smoking, and managing any medical conditions you have. Stroke is a leading cause of functional impairment. Primary prevention is particularly important because  a majority of strokes are first-time events. Stroke changes the lives of not only those who experience a stroke but also their family and other caregivers. How can this condition affect me? A stroke is a medical emergency and should be treated right away. A stroke can lead to brain damage and can sometimes be life-threatening. If a person gets medical treatment right away, there is a better chance of surviving and recovering from a stroke. What can increase my risk? The following medical conditions may increase your risk of a stroke: Cardiovascular disease. High blood pressure (hypertension). Diabetes. High cholesterol. Sickle cell disease. Blood clotting disorders (hypercoagulable state). Obesity. Sleep disorders (obstructive sleep apnea). Other risk factors include: Being older than age 75. Having a history of blood clots, stroke, or mini-stroke (transient ischemic attack, TIA). Genetic factors, such as race, ethnicity, or a family history of stroke. Smoking cigarettes or using other tobacco products. Taking birth control pills, especially if you also use tobacco. Heavy use of alcohol or drugs, especially cocaine and methamphetamine. Physical inactivity. What actions can I take to prevent this? Manage your health conditions High cholesterol levels. Eating a healthy diet is important for preventing high cholesterol. If cholesterol cannot be managed through diet alone, you may need to take medicines. Take any prescribed medicines to control your cholesterol as told by your health care provider. Hypertension. To reduce your risk of stroke, try to keep your blood pressure below 130/80. Eating a healthy diet and  exercising regularly are important for controlling blood pressure. If these steps are not enough to manage your blood pressure, you may need to take medicines. Take any prescribed medicines to control hypertension as told by your health care provider. Ask your health care provider  if you should monitor your blood pressure at home. Have your blood pressure checked every year, even if your blood pressure is normal. Blood pressure increases with age and some medical conditions. Diabetes. Eating a healthy diet and exercising regularly are important parts of managing your blood sugar (glucose). If your blood sugar cannot be managed through diet and exercise, you may need to take medicines. Take any prescribed medicines to control your diabetes as told by your health care provider. Get evaluated for obstructive sleep apnea. Talk to your health care provider about getting a sleep evaluation if you snore a lot or have excessive sleepiness. Make sure that any other medical conditions you have, such as atrial fibrillation or atherosclerosis, are managed. Nutrition Follow instructions from your health care provider about what to eat or drink to help manage your health condition. These instructions may include: Reducing your daily calorie intake. Limiting how much salt (sodium) you use to 1,500 milligrams (mg) each day. Using only healthy fats for cooking, such as olive oil, canola oil, or sunflower oil. Eating healthy foods. You can do this by: Choosing foods that are high in fiber, such as whole grains, and fresh fruits and vegetables. Eating at least 5 servings of fruits and vegetables a day. Try to fill one-half of your plate with fruits and vegetables at each meal. Choosing lean protein foods, such as lean cuts of meat, poultry without skin, fish, tofu, beans, and nuts. Eating low-fat dairy products. Avoiding foods that are high in sodium. This can help lower blood pressure. Avoiding foods that have saturated fat, trans fat, and cholesterol. This can help prevent high cholesterol. Avoiding processed and prepared foods. Counting your daily carbohydrate intake.  Lifestyle If you drink alcohol: Limit how much you have to: 0-1 drink a day for women who are not pregnant. 0-2  drinks a day for men. Know how much alcohol is in your drink. In the [place] one drink equals one 12 oz bottle of beer ( ), one 5 oz glass of wine ( ), or one 1 oz glass of hard liquor (44mL). Do not use any products that contain nicotine or tobacco. These products include cigarettes, chewing tobacco, and vaping devices, such as e-cigarettes. If you need help quitting, ask your health care provider. Avoid secondhand smoke. Do not use drugs. Activity  Try to stay at a healthy weight. Get at least 30 minutes of exercise on most days, such as: Fast walking. Biking. Swimming. Medicines Take over-the-counter and prescription medicines only as told by your health care provider. Aspirin or blood thinners (antiplatelets or anticoagulants) may be recommended to reduce your risk of forming blood clots that can lead to stroke. Avoid taking birth control pills. Talk to your health care provider about the risks of taking birth control pills if: You are over 23 years old. You smoke. You get very bad headaches. You have had a blood clot. Where to find more information American Stroke Association: www.strokeassociation.org Get help right away if: You or a loved one has any symptoms of a stroke. "BE FAST" is an easy way to remember the main warning signs of a stroke: B - Balance. Signs are dizziness, sudden trouble walking, or loss of balance. E - Eyes. Signs are  trouble seeing or a sudden change in vision. F - Face. Signs are sudden weakness or numbness of the face, or the face or eyelid drooping on one side. A - Arms. Signs are weakness or numbness in an arm. This happens suddenly and usually on one side of the body. S - Speech. Signs are sudden trouble speaking, slurred speech, or trouble understanding what people say. T - Time. Time to call emergency services. Write down what time symptoms started. You or a loved one has other signs of a stroke, such as: A sudden, severe headache with no  known cause. Nausea or vomiting. Seizure. These symptoms may represent a serious problem that is an emergency. Do not wait to see if the symptoms will go away. Get medical help right away. Call your local emergency services (911 in the [place] Do not drive yourself to the hospital. Summary You can help to prevent a stroke by eating healthy, exercising, not smoking, limiting alcohol intake, and managing any medical conditions you may have. Do not use any products that contain nicotine or tobacco. These include cigarettes, chewing tobacco, and vaping devices, such as e-cigarettes. If you need help quitting, ask your health care provider. Remember "BE FAST" for warning signs of a stroke. Get help right away if you or a loved one has any of these signs. This information is not intended to replace advice given to you by your health care provider. Make sure you discuss any questions you have with your health care provider. Document Revised: 03/31/2022 Document Reviewed: 03/31/2022 Elsevier Patient Education  2024 [employer].

## 2023-07-17 NOTE — Progress Notes (Signed)
 I agree with the above plan

## 2023-07-27 ENCOUNTER — Other Ambulatory Visit: Payer: Self-pay | Admitting: Nurse Practitioner

## 2023-07-27 DIAGNOSIS — Z8673 Personal history of transient ischemic attack (TIA), and cerebral infarction without residual deficits: Secondary | ICD-10-CM

## 2023-07-27 DIAGNOSIS — E7800 Pure hypercholesterolemia, unspecified: Secondary | ICD-10-CM

## 2023-07-30 NOTE — Progress Notes (Signed)
 Remote pacemaker transmission.

## 2023-07-30 NOTE — Addendum Note (Signed)
 Addended by: Geralyn Flash D on: 07/30/2023 03:40 PM   Modules accepted: Orders

## 2023-08-24 ENCOUNTER — Other Ambulatory Visit: Payer: Self-pay | Admitting: Nurse Practitioner

## 2023-08-24 DIAGNOSIS — E7800 Pure hypercholesterolemia, unspecified: Secondary | ICD-10-CM

## 2023-08-24 DIAGNOSIS — Z8673 Personal history of transient ischemic attack (TIA), and cerebral infarction without residual deficits: Secondary | ICD-10-CM

## 2023-09-21 ENCOUNTER — Ambulatory Visit (INDEPENDENT_AMBULATORY_CARE_PROVIDER_SITE_OTHER): Payer: Medicare PPO

## 2023-09-21 DIAGNOSIS — I495 Sick sinus syndrome: Secondary | ICD-10-CM

## 2023-09-22 ENCOUNTER — Other Ambulatory Visit: Payer: Self-pay | Admitting: Nurse Practitioner

## 2023-09-22 DIAGNOSIS — Z8673 Personal history of transient ischemic attack (TIA), and cerebral infarction without residual deficits: Secondary | ICD-10-CM

## 2023-09-22 DIAGNOSIS — E7800 Pure hypercholesterolemia, unspecified: Secondary | ICD-10-CM

## 2023-09-23 LAB — CUP PACEART REMOTE DEVICE CHECK
Battery Impedance: 1144 Ohm
Battery Remaining Longevity: 59 mo
Battery Voltage: 2.76 V
Brady Statistic AP VP Percent: 0 %
Brady Statistic AP VS Percent: 2 %
Brady Statistic AS VP Percent: 0 %
Brady Statistic AS VS Percent: 98 %
Date Time Interrogation Session: 20250514095055
Implantable Lead Connection Status: 753985
Implantable Lead Connection Status: 753985
Implantable Lead Implant Date: 20080703
Implantable Lead Implant Date: 20080703
Implantable Lead Location: 753859
Implantable Lead Location: 753860
Implantable Lead Model: 5076
Implantable Lead Model: 5076
Implantable Pulse Generator Implant Date: 20181024
Lead Channel Impedance Value: 490 Ohm
Lead Channel Impedance Value: 510 Ohm
Lead Channel Pacing Threshold Amplitude: 0.625 V
Lead Channel Pacing Threshold Amplitude: 0.875 V
Lead Channel Pacing Threshold Pulse Width: 0.4 ms
Lead Channel Pacing Threshold Pulse Width: 0.4 ms
Lead Channel Setting Pacing Amplitude: 1.5 V
Lead Channel Setting Pacing Amplitude: 2.5 V
Lead Channel Setting Pacing Pulse Width: 0.4 ms
Lead Channel Setting Sensing Sensitivity: 5.6 mV
Zone Setting Status: 755011
Zone Setting Status: 755011

## 2023-09-25 ENCOUNTER — Encounter: Payer: Self-pay | Admitting: Nurse Practitioner

## 2023-09-27 ENCOUNTER — Ambulatory Visit: Payer: Self-pay | Admitting: Cardiology

## 2023-09-28 ENCOUNTER — Ambulatory Visit: Payer: Medicare PPO | Admitting: Nurse Practitioner

## 2023-09-28 ENCOUNTER — Encounter: Payer: Self-pay | Admitting: Nurse Practitioner

## 2023-09-28 VITALS — BP 118/72 | HR 88 | Temp 97.1°F | Ht 65.0 in | Wt 76.0 lb

## 2023-09-28 DIAGNOSIS — E7800 Pure hypercholesterolemia, unspecified: Secondary | ICD-10-CM

## 2023-09-28 DIAGNOSIS — H6123 Impacted cerumen, bilateral: Secondary | ICD-10-CM | POA: Diagnosis not present

## 2023-09-28 DIAGNOSIS — Z8673 Personal history of transient ischemic attack (TIA), and cerebral infarction without residual deficits: Secondary | ICD-10-CM | POA: Insufficient documentation

## 2023-09-28 DIAGNOSIS — M810 Age-related osteoporosis without current pathological fracture: Secondary | ICD-10-CM | POA: Diagnosis not present

## 2023-09-28 DIAGNOSIS — R1310 Dysphagia, unspecified: Secondary | ICD-10-CM

## 2023-09-28 DIAGNOSIS — R636 Underweight: Secondary | ICD-10-CM

## 2023-09-28 DIAGNOSIS — I495 Sick sinus syndrome: Secondary | ICD-10-CM

## 2023-09-28 LAB — CBC WITH DIFFERENTIAL/PLATELET
Absolute Lymphocytes: 731 {cells}/uL — ABNORMAL LOW (ref 850–3900)
Absolute Monocytes: 753 {cells}/uL (ref 200–950)
Basophils Absolute: 64 {cells}/uL (ref 0–200)
Basophils Relative: 0.6 %
Eosinophils Absolute: 85 {cells}/uL (ref 15–500)
Eosinophils Relative: 0.8 %
HCT: 44.7 % (ref 35.0–45.0)
Hemoglobin: 14.4 g/dL (ref 11.7–15.5)
MCH: 31.7 pg (ref 27.0–33.0)
MCHC: 32.2 g/dL (ref 32.0–36.0)
MCV: 98.5 fL (ref 80.0–100.0)
MPV: 9.9 fL (ref 7.5–12.5)
Monocytes Relative: 7.1 %
Neutro Abs: 8968 {cells}/uL — ABNORMAL HIGH (ref 1500–7800)
Neutrophils Relative %: 84.6 %
Platelets: 319 10*3/uL (ref 140–400)
RBC: 4.54 10*6/uL (ref 3.80–5.10)
RDW: 11.8 % (ref 11.0–15.0)
Total Lymphocyte: 6.9 %
WBC: 10.6 10*3/uL (ref 3.8–10.8)

## 2023-09-28 LAB — COMPLETE METABOLIC PANEL WITHOUT GFR
AG Ratio: 1.5 (calc) (ref 1.0–2.5)
ALT: 9 U/L (ref 6–29)
AST: 19 U/L (ref 10–35)
Albumin: 4.1 g/dL (ref 3.6–5.1)
Alkaline phosphatase (APISO): 70 U/L (ref 37–153)
BUN: 18 mg/dL (ref 7–25)
CO2: 30 mmol/L (ref 20–32)
Calcium: 9.5 mg/dL (ref 8.6–10.4)
Chloride: 96 mmol/L — ABNORMAL LOW (ref 98–110)
Creat: 0.8 mg/dL (ref 0.60–0.95)
Globulin: 2.8 g/dL (ref 1.9–3.7)
Glucose, Bld: 96 mg/dL (ref 65–99)
Potassium: 4.2 mmol/L (ref 3.5–5.3)
Sodium: 137 mmol/L (ref 135–146)
Total Bilirubin: 0.5 mg/dL (ref 0.2–1.2)
Total Protein: 6.9 g/dL (ref 6.1–8.1)

## 2023-09-28 LAB — LIPID PANEL
Cholesterol: 165 mg/dL (ref <200)
HDL: 62 mg/dL (ref >=50)
LDL Cholesterol (Calc): 81 mg/dL
Non-HDL Cholesterol (Calc): 103 mg/dL (ref <130)
Total CHOL/HDL Ratio: 2.7 (calc) (ref <5.0)
Triglycerides: 119 mg/dL (ref <150)

## 2023-09-28 LAB — TSH: TSH: 1.39 m[IU]/L (ref 0.40–4.50)

## 2023-09-28 MED ORDER — ASPIRIN 81 MG PO TBEC
81.0000 mg | DELAYED_RELEASE_TABLET | Freq: Every day | ORAL | 1 refills | Status: DC
Start: 2023-09-28 — End: 2023-12-07

## 2023-09-28 NOTE — Assessment & Plan Note (Signed)
 encouraged to liberalize diet. To have protein supplement in addition to smallest meal of the day.

## 2023-09-28 NOTE — Assessment & Plan Note (Signed)
 Lavage preformed and tolerated well.

## 2023-09-28 NOTE — Assessment & Plan Note (Signed)
Recommended to take calcium 600 mg twice daily with Vitamin D 2000 units daily and weight bearing activity 30 mins/5 days a week   

## 2023-09-28 NOTE — Assessment & Plan Note (Signed)
 Continues on crestor , tolerating well.

## 2023-09-28 NOTE — Assessment & Plan Note (Signed)
 Stable at this time, continues with pacemaker

## 2023-09-28 NOTE — Assessment & Plan Note (Signed)
 She continues on plavix , but per neurology can stop and start ASA EC 81 mg daily Continues on statin

## 2023-09-28 NOTE — Assessment & Plan Note (Signed)
 Dietary modifications recommended but did not feel like she could maintain these recs

## 2023-09-28 NOTE — Progress Notes (Signed)
 Careteam: Patient Care Team: Verma Gobble, NP as PCP - General (Geriatric Medicine) Rudine Cos, MD as Consulting Physician (Ophthalmology)  PLACE OF SERVICE:  [facility]  Advanced Directive information    Allergies  Allergen Reactions   Sulfonamide Derivatives Other (See Comments)    "Made me very sick," per the patient    Penicillins Swelling and Rash    Has patient had a PCN reaction causing immediate rash, facial/tongue/throat swelling, SOB or lightheadedness with hypotension: Yes Has patient had a PCN reaction causing severe rash involving mucus membranes or skin necrosis: Yes Has patient had a PCN reaction that required hospitalization: No Has patient had a PCN reaction occurring within the last 10 years: No If all of the above answers are "NO", then may proceed with Cephalosporin use.     Chief Complaint  Patient presents with   Medical Management of Chronic Issues    3 month follow up, Labs, discuss Covid vaccine and Annual wellness visit (pending for July 14).     HPI:  Discussed the use of AI scribe software for clinical note transcription with the patient, who gave verbal consent to proceed.  History of Present Illness Alexandria Wright is a 89 year old female who presents for a follow-up regarding her medication regimen and nutritional status for her 3 month follow up. She is accompanied by a niece who assists with her medications and dietary needs.  She is following up to discuss her medication regimen post-stroke. She had a stroke in January and is currently on Plavix  75 mg and rosuvastatin  for cholesterol management. Aspirin  81 mg was stopped a month ago.  She has experienced significant weight loss, from 80 pounds to 76 pounds, despite consuming about three meals a day, primarily consisting of Cheerios for breakfast and Stouffer meals for lunch and dinner. She supplements her diet with Ensure. Attempts to introduce new foods have been met with  resistance. Her niece notes low energy levels.  She has a history of osteoporosis and is taking calcium  and vitamin D supplements. She is engaging in exercises recommended by home health therapists post-hospitalization.  She experiences issues with swallowing, which she manages by adjusting her posture while eating. This has been a long-standing issue, she had swallow evaluation but did not like the recommendations.  She has a history of skin cancer and is using a prescribed cream for lesions on her face. She has not seen her dermatologist recently.  No issues with breathing, bowel movements, or heart palpitations. Her hearing is impaired.    Review of Systems:  Review of Systems  Constitutional:  Positive for weight loss. Negative for chills and fever.  HENT:  Positive for hearing loss. Negative for tinnitus.   Respiratory:  Negative for cough, sputum production and shortness of breath.   Cardiovascular:  Negative for chest pain, palpitations and leg swelling.  Gastrointestinal:  Negative for abdominal pain, constipation, diarrhea and heartburn.  Genitourinary:  Negative for dysuria, frequency and urgency.  Musculoskeletal:  Negative for back pain, falls, joint pain and myalgias.  Skin: Negative.   Neurological:  Negative for dizziness and headaches.  Psychiatric/Behavioral:  Negative for depression and memory loss. The patient does not have insomnia.     Past Medical History:  Diagnosis Date   Bradycardia    a. s/p MDT dual chamber PPM    Esophageal stricture    GERD (gastroesophageal reflux disease)    Skin cancer    Syncope    Tachycardia  asyptomatic nonsustained   Past Surgical History:  Procedure Laterality Date   CATARACT EXTRACTION  12/10/2011   right    CATARACT EXTRACTION  12/17/2011   left   PACEMAKER PLACEMENT  2008   MDT dual chamber PPM implanted by Dr Rodolfo Clan for symptomatic bradycardia   PPM GENERATOR CHANGEOUT N/A 03/04/2017   Procedure: PPM GENERATOR  CHANGEOUT;  Surgeon: Verona Goodwill, MD;  Location: [facility] INVASIVE CV LAB;  Service: Cardiovascular;  Laterality: N/A;   SKIN CANCER EXCISION     TONSILLECTOMY AND ADENOIDECTOMY     Social History:   reports that she has never smoked. She has never used smokeless tobacco. She reports that she does not drink alcohol and does not use drugs.  Family History  Problem Relation Age of Onset   Heart disease Mother    Colon cancer Neg Hx     Medications: Patient's Medications  New Prescriptions   ASPIRIN  EC 81 MG TABLET    Take 1 tablet (81 mg total) by mouth daily. Swallow whole.  Previous Medications   CALCIUM  CARBONATE-VITAMIN D (OSCAL 500/200 D-3 PO)    Take 1 tablet by mouth daily with breakfast.   CHOLECALCIFEROL (VITAMIN D3) 50 MCG (2000 UT) TABS    Take 2,000 Units by mouth daily.   CLOPIDOGREL  (PLAVIX ) 75 MG TABLET    TAKE 1 TABLET BY MOUTH EVERY DAY   MULTIPLE VITAMINS-MINERALS (CENTRUM MINIS WOMEN 50+ PO)    Take 1 tablet by mouth daily.   PROPYLENE GLYCOL (SYSTANE COMPLETE) 0.6 % SOLN    Apply 1 drop to eye daily.   ROSUVASTATIN  (CRESTOR ) 10 MG TABLET    TAKE 1 TABLET BY MOUTH EVERY DAY  Modified Medications   No medications on file  Discontinued Medications   HOMEOPATHIC PRODUCTS (SIMILASAN DRY EYE RELIEF OP)    Place 1 drop into both eyes 3 (three) times daily as needed (for irritation).   MULTIPLE VITAMIN (MULTIVITAMIN WITH MINERALS) TABS TABLET    Take 1 tablet by mouth daily with breakfast.    Physical Exam:  Vitals:   09/25/23 1337  BP: 118/72  Pulse: 88  Temp: (!) 97.1 F (36.2 C)  SpO2: 95%  Weight: 76 lb (34.5 kg)  Height: 5\' 5"  (1.651 m)   Body mass index is 12.65 kg/m. Wt Readings from Last 3 Encounters:  09/25/23 76 lb (34.5 kg)  07/08/23 80 lb (36.3 kg)  06/19/23 81 lb (36.7 kg)    Physical Exam Constitutional:      General: She is not in acute distress.    Appearance: She is well-developed. She is not diaphoretic.  HENT:     Head: Normocephalic  and atraumatic.     Right Ear: There is impacted cerumen.     Left Ear: There is impacted cerumen.     Mouth/Throat:     Pharynx: No oropharyngeal exudate.  Eyes:     Conjunctiva/sclera: Conjunctivae normal.     Pupils: Pupils are equal, round, and reactive to light.  Cardiovascular:     Rate and Rhythm: Normal rate and regular rhythm.     Heart sounds: Normal heart sounds.  Pulmonary:     Effort: Pulmonary effort is normal.     Breath sounds: Normal breath sounds.  Abdominal:     General: Bowel sounds are normal.     Palpations: Abdomen is soft.  Musculoskeletal:     Cervical back: Normal range of motion and neck supple.     Right lower leg: No edema.  Left lower leg: No edema.  Skin:    General: Skin is warm and dry.  Neurological:     Mental Status: She is alert.  Psychiatric:        Mood and Affect: Mood normal.     Labs reviewed: Basic Metabolic Panel: Recent Labs    11/17/22 0936 06/01/23 1852  NA 138 134*  K 4.2 4.3  CL 99 98  CO2 32 22  GLUCOSE 101* 177*  BUN 17 21  CREATININE 0.83 1.05*  CALCIUM  9.5 9.2   Liver Function Tests: Recent Labs    11/17/22 0936  AST 14  ALT 8  BILITOT 0.6  PROT 6.7   No results for input(s): "LIPASE", "AMYLASE" in the last 8760 hours. No results for input(s): "AMMONIA" in the last 8760 hours. CBC: Recent Labs    11/17/22 0936 06/01/23 1852  WBC 9.6 12.4*  NEUTROABS 7,728  --   HGB 14.6 14.8  HCT 43.6 46.8*  MCV 97.1 103.8*  PLT 249 214   Lipid Panel: Recent Labs    11/17/22 0936 06/01/23 1852 06/02/23 1346  CHOL 189 206* 210*  HDL 64 75 69  LDLCALC 104* 114* 121*  TRIG 116 86 98  CHOLHDL 3.0 2.7 3.0   TSH: No results for input(s): "TSH" in the last 8760 hours. A1C: Lab Results  Component Value Date   HGBA1C 5.3 06/02/2023     Assessment/Plan  History of CVA (cerebrovascular accident) Assessment & Plan: She continues on plavix , but per neurology can stop and start ASA EC 81 mg  daily Continues on statin  Orders: -     Aspirin ; Take 1 tablet (81 mg total) by mouth daily. Swallow whole.  Dispense: 90 tablet; Refill: 1 -     COMPLETE METABOLIC PANEL WITHOUT GFR -     CBC with Differential/Platelet -     Lipid panel  Pure hypercholesterolemia Assessment & Plan: Continues on crestor , tolerating well.   Orders: -     COMPLETE METABOLIC PANEL WITHOUT GFR -     CBC with Differential/Platelet -     Lipid panel  Senile osteoporosis Assessment & Plan: Recommended to take calcium  600 mg twice daily with Vitamin D 2000 units daily and weight bearing activity 30 mins/5 days a week  Orders: -     COMPLETE METABOLIC PANEL WITHOUT GFR -     CBC with Differential/Platelet  Underweight Assessment & Plan: encouraged to liberalize diet. To have protein supplement in addition to smallest meal of the day.  Orders: -     COMPLETE METABOLIC PANEL WITHOUT GFR -     CBC with Differential/Platelet -     TSH  Dysphagia, unspecified type Assessment & Plan: Dietary modifications recommended but did not feel like she could maintain these recs   Tachy-brady syndrome (HCC) Assessment & Plan: Stable at this time, continues with pacemaker    Bilateral impacted cerumen Assessment & Plan: Lavage preformed and tolerated well.       Return in about 3 months (around 12/29/2023) for routine follow up.  Lamari Youngers K. Denney Fisherman [facility] [phone]

## 2023-09-29 ENCOUNTER — Ambulatory Visit: Payer: Self-pay | Admitting: Nurse Practitioner

## 2023-10-06 DIAGNOSIS — D0462 Carcinoma in situ of skin of left upper limb, including shoulder: Secondary | ICD-10-CM | POA: Diagnosis not present

## 2023-10-06 DIAGNOSIS — L244 Irritant contact dermatitis due to drugs in contact with skin: Secondary | ICD-10-CM | POA: Diagnosis not present

## 2023-10-06 DIAGNOSIS — Z85828 Personal history of other malignant neoplasm of skin: Secondary | ICD-10-CM | POA: Diagnosis not present

## 2023-10-06 DIAGNOSIS — C44622 Squamous cell carcinoma of skin of right upper limb, including shoulder: Secondary | ICD-10-CM | POA: Diagnosis not present

## 2023-11-09 NOTE — Progress Notes (Signed)
 Remote pacemaker transmission.

## 2023-11-09 NOTE — Addendum Note (Signed)
 Addended by: TAWNI DRILLING D on: 11/09/2023 03:20 PM   Modules accepted: Orders

## 2023-11-23 ENCOUNTER — Encounter: Payer: Medicare PPO | Admitting: Adult Health

## 2023-11-23 ENCOUNTER — Other Ambulatory Visit: Payer: Medicare PPO

## 2023-11-30 ENCOUNTER — Emergency Department (HOSPITAL_COMMUNITY)

## 2023-11-30 ENCOUNTER — Inpatient Hospital Stay (HOSPITAL_COMMUNITY)
Admission: EM | Admit: 2023-11-30 | Discharge: 2023-12-11 | DRG: 480 | Disposition: E | Attending: Internal Medicine | Admitting: Internal Medicine

## 2023-11-30 ENCOUNTER — Other Ambulatory Visit: Payer: Self-pay

## 2023-11-30 ENCOUNTER — Ambulatory Visit: Payer: Medicare PPO | Admitting: Nurse Practitioner

## 2023-11-30 DIAGNOSIS — I700 Atherosclerosis of aorta: Secondary | ICD-10-CM | POA: Diagnosis not present

## 2023-11-30 DIAGNOSIS — Z7401 Bed confinement status: Secondary | ICD-10-CM | POA: Diagnosis not present

## 2023-11-30 DIAGNOSIS — R918 Other nonspecific abnormal finding of lung field: Secondary | ICD-10-CM | POA: Diagnosis not present

## 2023-11-30 DIAGNOSIS — R54 Age-related physical debility: Secondary | ICD-10-CM | POA: Diagnosis present

## 2023-11-30 DIAGNOSIS — Z7982 Long term (current) use of aspirin: Secondary | ICD-10-CM

## 2023-11-30 DIAGNOSIS — M8580 Other specified disorders of bone density and structure, unspecified site: Secondary | ICD-10-CM | POA: Diagnosis not present

## 2023-11-30 DIAGNOSIS — R627 Adult failure to thrive: Secondary | ICD-10-CM | POA: Diagnosis present

## 2023-11-30 DIAGNOSIS — I495 Sick sinus syndrome: Secondary | ICD-10-CM | POA: Diagnosis present

## 2023-11-30 DIAGNOSIS — J690 Pneumonitis due to inhalation of food and vomit: Secondary | ICD-10-CM | POA: Diagnosis not present

## 2023-11-30 DIAGNOSIS — J479 Bronchiectasis, uncomplicated: Secondary | ICD-10-CM | POA: Diagnosis present

## 2023-11-30 DIAGNOSIS — I10 Essential (primary) hypertension: Secondary | ICD-10-CM | POA: Diagnosis not present

## 2023-11-30 DIAGNOSIS — Y92008 Other place in unspecified non-institutional (private) residence as the place of occurrence of the external cause: Secondary | ICD-10-CM | POA: Diagnosis not present

## 2023-11-30 DIAGNOSIS — Z85828 Personal history of other malignant neoplasm of skin: Secondary | ICD-10-CM | POA: Diagnosis not present

## 2023-11-30 DIAGNOSIS — R64 Cachexia: Secondary | ICD-10-CM | POA: Diagnosis present

## 2023-11-30 DIAGNOSIS — D62 Acute posthemorrhagic anemia: Secondary | ICD-10-CM | POA: Diagnosis not present

## 2023-11-30 DIAGNOSIS — Z66 Do not resuscitate: Secondary | ICD-10-CM | POA: Diagnosis not present

## 2023-11-30 DIAGNOSIS — Z950 Presence of cardiac pacemaker: Secondary | ICD-10-CM

## 2023-11-30 DIAGNOSIS — Z515 Encounter for palliative care: Secondary | ICD-10-CM | POA: Diagnosis not present

## 2023-11-30 DIAGNOSIS — J984 Other disorders of lung: Secondary | ICD-10-CM | POA: Diagnosis not present

## 2023-11-30 DIAGNOSIS — Z79899 Other long term (current) drug therapy: Secondary | ICD-10-CM

## 2023-11-30 DIAGNOSIS — S7221XA Displaced subtrochanteric fracture of right femur, initial encounter for closed fracture: Secondary | ICD-10-CM | POA: Diagnosis present

## 2023-11-30 DIAGNOSIS — S72041A Displaced fracture of base of neck of right femur, initial encounter for closed fracture: Secondary | ICD-10-CM | POA: Diagnosis not present

## 2023-11-30 DIAGNOSIS — Z7902 Long term (current) use of antithrombotics/antiplatelets: Secondary | ICD-10-CM

## 2023-11-30 DIAGNOSIS — K222 Esophageal obstruction: Secondary | ICD-10-CM | POA: Diagnosis present

## 2023-11-30 DIAGNOSIS — E7800 Pure hypercholesterolemia, unspecified: Secondary | ICD-10-CM | POA: Diagnosis not present

## 2023-11-30 DIAGNOSIS — I2510 Atherosclerotic heart disease of native coronary artery without angina pectoris: Secondary | ICD-10-CM | POA: Diagnosis not present

## 2023-11-30 DIAGNOSIS — J9601 Acute respiratory failure with hypoxia: Secondary | ICD-10-CM | POA: Diagnosis not present

## 2023-11-30 DIAGNOSIS — W010XXA Fall on same level from slipping, tripping and stumbling without subsequent striking against object, initial encounter: Secondary | ICD-10-CM | POA: Diagnosis present

## 2023-11-30 DIAGNOSIS — S72001A Fracture of unspecified part of neck of right femur, initial encounter for closed fracture: Principal | ICD-10-CM

## 2023-11-30 DIAGNOSIS — N179 Acute kidney failure, unspecified: Secondary | ICD-10-CM | POA: Diagnosis not present

## 2023-11-30 DIAGNOSIS — Z681 Body mass index (BMI) 19 or less, adult: Secondary | ICD-10-CM | POA: Diagnosis not present

## 2023-11-30 DIAGNOSIS — Z882 Allergy status to sulfonamides status: Secondary | ICD-10-CM

## 2023-11-30 DIAGNOSIS — W19XXXA Unspecified fall, initial encounter: Secondary | ICD-10-CM | POA: Diagnosis not present

## 2023-11-30 DIAGNOSIS — R0602 Shortness of breath: Secondary | ICD-10-CM | POA: Diagnosis not present

## 2023-11-30 DIAGNOSIS — Z8249 Family history of ischemic heart disease and other diseases of the circulatory system: Secondary | ICD-10-CM | POA: Diagnosis not present

## 2023-11-30 DIAGNOSIS — E43 Unspecified severe protein-calorie malnutrition: Secondary | ICD-10-CM | POA: Diagnosis not present

## 2023-11-30 DIAGNOSIS — E875 Hyperkalemia: Secondary | ICD-10-CM | POA: Diagnosis not present

## 2023-11-30 DIAGNOSIS — K224 Dyskinesia of esophagus: Secondary | ICD-10-CM | POA: Diagnosis present

## 2023-11-30 DIAGNOSIS — E611 Iron deficiency: Secondary | ICD-10-CM | POA: Diagnosis present

## 2023-11-30 DIAGNOSIS — Z9842 Cataract extraction status, left eye: Secondary | ICD-10-CM

## 2023-11-30 DIAGNOSIS — L89151 Pressure ulcer of sacral region, stage 1: Secondary | ICD-10-CM | POA: Diagnosis present

## 2023-11-30 DIAGNOSIS — M40209 Unspecified kyphosis, site unspecified: Secondary | ICD-10-CM | POA: Diagnosis present

## 2023-11-30 DIAGNOSIS — E785 Hyperlipidemia, unspecified: Secondary | ICD-10-CM | POA: Diagnosis present

## 2023-11-30 DIAGNOSIS — M25551 Pain in right hip: Secondary | ICD-10-CM | POA: Diagnosis present

## 2023-11-30 DIAGNOSIS — I471 Supraventricular tachycardia, unspecified: Secondary | ICD-10-CM | POA: Diagnosis not present

## 2023-11-30 DIAGNOSIS — K219 Gastro-esophageal reflux disease without esophagitis: Secondary | ICD-10-CM | POA: Diagnosis present

## 2023-11-30 DIAGNOSIS — Z7189 Other specified counseling: Secondary | ICD-10-CM | POA: Diagnosis not present

## 2023-11-30 DIAGNOSIS — M6250 Muscle wasting and atrophy, not elsewhere classified, unspecified site: Secondary | ICD-10-CM | POA: Diagnosis present

## 2023-11-30 DIAGNOSIS — S72141A Displaced intertrochanteric fracture of right femur, initial encounter for closed fracture: Principal | ICD-10-CM | POA: Diagnosis present

## 2023-11-30 DIAGNOSIS — Z880 Allergy status to penicillin: Secondary | ICD-10-CM

## 2023-11-30 DIAGNOSIS — R9431 Abnormal electrocardiogram [ECG] [EKG]: Secondary | ICD-10-CM | POA: Diagnosis not present

## 2023-11-30 DIAGNOSIS — R109 Unspecified abdominal pain: Secondary | ICD-10-CM | POA: Diagnosis not present

## 2023-11-30 DIAGNOSIS — S7700XA Crushing injury of unspecified hip, initial encounter: Secondary | ICD-10-CM | POA: Diagnosis not present

## 2023-11-30 DIAGNOSIS — K449 Diaphragmatic hernia without obstruction or gangrene: Secondary | ICD-10-CM | POA: Diagnosis present

## 2023-11-30 DIAGNOSIS — Z8673 Personal history of transient ischemic attack (TIA), and cerebral infarction without residual deficits: Secondary | ICD-10-CM

## 2023-11-30 DIAGNOSIS — Z9841 Cataract extraction status, right eye: Secondary | ICD-10-CM

## 2023-11-30 DIAGNOSIS — J929 Pleural plaque without asbestos: Secondary | ICD-10-CM | POA: Diagnosis not present

## 2023-11-30 LAB — CBC WITH DIFFERENTIAL/PLATELET
Abs Immature Granulocytes: 0.14 K/uL — ABNORMAL HIGH (ref 0.00–0.07)
Basophils Absolute: 0.1 K/uL (ref 0.0–0.1)
Basophils Relative: 0 %
Eosinophils Absolute: 0 K/uL (ref 0.0–0.5)
Eosinophils Relative: 0 %
HCT: 39.4 % (ref 36.0–46.0)
Hemoglobin: 12.2 g/dL (ref 12.0–15.0)
Immature Granulocytes: 1 %
Lymphocytes Relative: 2 %
Lymphs Abs: 0.4 K/uL — ABNORMAL LOW (ref 0.7–4.0)
MCH: 31.1 pg (ref 26.0–34.0)
MCHC: 31 g/dL (ref 30.0–36.0)
MCV: 100.5 fL — ABNORMAL HIGH (ref 80.0–100.0)
Monocytes Absolute: 1.3 K/uL — ABNORMAL HIGH (ref 0.1–1.0)
Monocytes Relative: 5 %
Neutro Abs: 24.5 K/uL — ABNORMAL HIGH (ref 1.7–7.7)
Neutrophils Relative %: 92 %
Platelets: 311 K/uL (ref 150–400)
RBC: 3.92 MIL/uL (ref 3.87–5.11)
RDW: 14 % (ref 11.5–15.5)
WBC: 26.4 K/uL — ABNORMAL HIGH (ref 4.0–10.5)
nRBC: 0 % (ref 0.0–0.2)

## 2023-11-30 LAB — TYPE AND SCREEN
ABO/RH(D): O NEG
Antibody Screen: NEGATIVE

## 2023-11-30 LAB — PROTIME-INR
INR: 1.1 (ref 0.8–1.2)
Prothrombin Time: 14.4 s (ref 11.4–15.2)

## 2023-11-30 LAB — BASIC METABOLIC PANEL WITH GFR
Anion gap: 10 (ref 5–15)
BUN: 22 mg/dL (ref 8–23)
CO2: 30 mmol/L (ref 22–32)
Calcium: 9.2 mg/dL (ref 8.9–10.3)
Chloride: 98 mmol/L (ref 98–111)
Creatinine, Ser: 0.85 mg/dL (ref 0.44–1.00)
GFR, Estimated: >60 mL/min (ref >60)
Glucose, Bld: 131 mg/dL — ABNORMAL HIGH (ref 70–99)
Potassium: 4.7 mmol/L (ref 3.5–5.1)
Sodium: 138 mmol/L (ref 135–145)

## 2023-11-30 MED ORDER — HYDROCODONE-ACETAMINOPHEN 5-325 MG PO TABS
1.0000 | ORAL_TABLET | Freq: Four times a day (QID) | ORAL | Status: DC | PRN
  Administered 2023-11-30 – 2023-12-01 (×2): 1 via ORAL
  Filled 2023-11-30 (×2): qty 1

## 2023-11-30 MED ORDER — MORPHINE SULFATE (PF) 2 MG/ML IV SOLN
0.5000 mg | INTRAVENOUS | Status: DC | PRN
  Administered 2023-11-30 – 2023-12-01 (×3): 0.5 mg via INTRAVENOUS
  Filled 2023-11-30 (×3): qty 1

## 2023-11-30 MED ORDER — SENNOSIDES-DOCUSATE SODIUM 8.6-50 MG PO TABS: 1.0000 | ORAL_TABLET | Freq: Every evening | ORAL | Status: DC | PRN

## 2023-11-30 MED ORDER — BISACODYL 5 MG PO TBEC: 5.0000 mg | DELAYED_RELEASE_TABLET | Freq: Every day | ORAL | Status: DC | PRN

## 2023-11-30 MED ORDER — ROSUVASTATIN CALCIUM 5 MG PO TABS
10.0000 mg | ORAL_TABLET | Freq: Every day | ORAL | Status: DC
  Administered 2023-12-01 – 2023-12-03 (×3): 10 mg via ORAL
  Filled 2023-11-30 (×4): qty 2

## 2023-11-30 MED ORDER — ACETAMINOPHEN 325 MG PO TABS: 650.0000 mg | ORAL_TABLET | Freq: Four times a day (QID) | ORAL | Status: DC | PRN

## 2023-11-30 MED ORDER — ONDANSETRON HCL 4 MG/2ML IJ SOLN: 4.0000 mg | Freq: Four times a day (QID) | INTRAMUSCULAR | Status: DC | PRN

## 2023-11-30 NOTE — Progress Notes (Signed)
 Consult received from EDP.  Pertinent imaging reviewed which shows right intertrochanteric hip fx.  Due to OR availability, will transfer patient to [facility] for surgical repair tomorrow.  May have diet tonight.  NPO after midnight.  Hold lovenox .  Admission to medicine service at [facility].  Will see in the morning for full consult.

## 2023-11-30 NOTE — ED Notes (Signed)
 [facility] called, patient added to transport list

## 2023-11-30 NOTE — ED Notes (Signed)
 Call placed to 5N to advise pt has been picked up by [facility]

## 2023-11-30 NOTE — ED Provider Notes (Signed)
 [place] EMERGENCY DEPARTMENT AT [facility] Provider Note   CSN: [id number] Arrival date & time: 11/30/23  1534     Patient presents with: Fall (Right hip displacement )   Alexandria Wright is a 89 y.o. female.   89 year old female with past medical history of GERD and hyperlipidemia presenting to the emergency department today with right hip pain.  The patient had a mechanical fall earlier today.  States that she turned around her house and lost her footing and fell on her right hip.  She did not hit her head or lose consciousness.  Denies any headache or neck pain.  She came to the emergency department at that time for further evaluation.  She denies any other injuries.  Denies any focal weakness, numbness, or tingling.   Fall       Prior to Admission medications   Medication Sig Start Date End Date Taking? Authorizing Provider  aspirin  EC 81 MG tablet Take 1 tablet (81 mg total) by mouth daily. Swallow whole. 09/28/23   Caro Harlene POUR, NP  Calcium  Carbonate-Vitamin D (OSCAL 500/200 D-3 PO) Take 1 tablet by mouth daily with breakfast.    [provider]  Cholecalciferol (VITAMIN D3) 50 MCG (2000 UT) TABS Take 2,000 Units by mouth daily.    [provider]  clopidogrel  (PLAVIX ) 75 MG tablet TAKE 1 TABLET BY MOUTH EVERY DAY 08/24/23   Eubanks, Jessica K, NP  Multiple Vitamins-Minerals (CENTRUM MINIS WOMEN 50+ PO) Take 1 tablet by mouth daily.    [provider]  Propylene Glycol (SYSTANE COMPLETE) 0.6 % SOLN Apply 1 drop to eye daily.    [provider]  rosuvastatin  (CRESTOR ) 10 MG tablet TAKE 1 TABLET BY MOUTH EVERY DAY 09/22/23   Eubanks, Jessica K, NP    Allergies: Sulfonamide derivatives and Penicillins    Review of Systems  Musculoskeletal:        Right hip pain  All other systems reviewed and are negative.   Updated Vital Signs BP (!) 145/70 (BP Location: Right Arm)   Pulse 86   Temp 97.7 F (36.5 C)   Resp 16    Ht 5' 5 (1.651 m)   Wt 34.5 kg   SpO2 97%   BMI 12.66 kg/m   Physical Exam Vitals and nursing note reviewed.   Gen: NAD Eyes: PERRL, EOMI HEENT: no oropharyngeal swelling Neck: trachea midline, no midline tenderness Resp: clear to auscultation bilaterally Card: RRR, no murmurs, rubs, or gallops Abd: nontender, nondistended Extremities: no calf tenderness, no edema Vascular: 2+ radial pulses bilaterally, 2+ DP pulses bilaterally MSK: Gross deformity noted to the right hip, compartments are soft Neuro: Equal strength and sensation throughout the bilateral lower extremities with assessment of ankle flexion extension, knee flexion extension or hip flexion extension is not attempted due to obvious deformity Skin: no rashes Psyc: acting appropriately   (all labs ordered are listed, but only abnormal results are displayed) Labs Reviewed  BASIC METABOLIC PANEL WITH GFR - Abnormal; Notable for the following components:      Result Value   Glucose, Bld 131 (*)    All other components within normal limits  CBC WITH DIFFERENTIAL/PLATELET - Abnormal; Notable for the following components:   WBC 26.4 (*)    MCV 100.5 (*)    Neutro Abs 24.5 (*)    Lymphs Abs 0.4 (*)    Monocytes Absolute 1.3 (*)    Abs Immature Granulocytes 0.14 (*)    All other components  within normal limits  PROTIME-INR  TYPE AND SCREEN    EKG: None  Radiology: DG HIP UNILAT WITH PELVIS 2-3 VIEWS RIGHT Result Date: 11/30/2023 CLINICAL DATA:  Fall and right hip pain. EXAM: DG HIP (WITH OR WITHOUT PELVIS) 2-3V RIGHT COMPARISON:  None Available. FINDINGS: There is a comminuted, angulated, and displaced fracture of the right femoral neck. No dislocation. The bones are osteopenic. The soft tissues are unremarkable. IMPRESSION: Comminuted, angulated, and displaced fracture of the right femoral neck. Electronically Signed   By: Vanetta Chou M.D.   On: 11/30/2023 17:05     Procedures   Medications Ordered in the  ED - No data to display                                  Medical Decision Making 89 year old female with past medical history hyperlipidemia and GERD presenting to the emergency department today with right hip pain after a mechanical fall at home.  I will further evaluate patient here with labs as well as an x-ray as I am concerned for likely fracture.  The patient is offered pain medications but is not having any acute pain at this time when she is not moving.  She will likely require admission.  As well as evaluate for dislocation.  The patient does have significant leukocytosis of undetermined significance.  Denies any infectious symptoms.  I did speak with Dr. Jerri he recommends admission to [facility] for further evaluation.  Calls placed hospital service for admission.  Amount and/or Complexity of Data Reviewed Labs: ordered. Radiology: ordered.  Risk Decision regarding hospitalization.        Final diagnoses:  Closed displaced fracture of right femoral neck [facility])    ED Discharge Orders     None          Ula Prentice SAUNDERS, MD 11/30/23 [id number]

## 2023-11-30 NOTE — Hospital Course (Addendum)
 ILYSSA Wright is a 89 y.o. female with medical history significant for history of CVA, tachy-brady syndrome s/p PPM who is admitted with right hip fracture. 7/22 underwent ORIF with intramedullary implant.  7/23 significant leukocytosis, chest x-ray for shortness of breath Negative for pneumonia. 7/24 SLP evaluation, recommended regular thin liquid diet.  Hemoglobin dropped.  Below 8.  Received 1 PRBC transfusion.  Was delirious, saying I want to die. 7/25 transferred to progressive care due to worsening respiratory status.  CT PE protocol negative for PE concerning for possible pneumonia.  Procalcitonin trending up.  Now on Venturi mask. 7/26 required mittens in the morning, mentation improved as the day progressed and pain got controlled.  Enema ordered.  Goals of care conversation with the family. 7/17 mentation worsened since the patient received opioids for uncontrolled pain.  AKI with hyperkalemia.  Transitioned to comfort care based on the discussion with the family.  Initiated on a Dilaudid  drip at a low rate.  Assessment and Plan: Closed displaced intertrochanteric fracture of right femur, initial encounter After mechanical fall Orthopedic surgery was consulted. Underwent intramedullary implant on 7/22.  With Dr.Xu Pain control has been difficult postoperatively given her difficulty swallowing, need for oxygen as well as intermittent confusion. Now comfort care. On Dilaudid  drip.  Continue Tylenol  IV for now.   Aspiration pneumonia  Chronic bronchiectasis  leukocytosis Large hiatal hernia Esophageal stricture. Initially leukocytosis was thought to be secondary to stress reaction in the setting of recent surgery and fracture. X-ray was performed on 7/23 which was negative for frank pneumonia. Given persistent leukocytosis and delirium patient was started on IV antibiotics.  Ceftriaxone  and Flagyl  based on her penicillin allergy . Blood cultures so far negative. Pulmonary consult  due to worsening respiratory status. CT PE protocol negative for PE. There is concern for fat embolism although less likely and most likely this is pneumonia progression. SLP evaluate the patient.  Recommended regular diet on 7/24.  Now comfort care.  Acute postop blood loss anemia. Chronic iron deficiency. Expected drop in the hemoglobin postoperatively. Baseline hemoglobin around 14.  Hemoglobin was 12 on admission.  Dropped down to 7.7 on 7/24. Status post 1 PRBC transfusion. Hemoglobin remaining stable after that. Iron level 10.  Folate 17.  B12 427.  Not a good candidate for IV iron therapy in the setting of infection. Etiology of the iron deficiency is most likely from poor p.o. intake given her cachectic state.  AKI with hyperkalemia. Serum creatinine trended up to 1.13. Then improved.  On 7/27 serum creatinine trended up again.  With hyperkalemia potassium 5.7. BUN 81. Mentation worsened as well. Initiated on IV fluid earlier.  Now stopped as transitioning to now comfort care given increasing the risk of volume overload.   History of CVA: Was on aspirin  and statin. Now comfort care.   Sinus tachycardia S/P pacemaker implant for tachybradycardia syndrome. Currently with sinus tachycardia in the setting of respiratory distress.  Treated with as needed IV Lopressor . Now comfort care.  Delirium. Likely postop as well as hospital induced. Continue pain control.  Delirium prevention protocol.  Adult failure to thrive with underweight Body mass index is 12.03 kg/m.  Severe protein calorie malnutrition. Interventions: Magic cup, Ensure Enlive (each supplement provides 350kcal and 20 grams of protein), Liberalize Diet, MVI Dietitian following. Placing the patient had a poor outcome. Now comfort care.  Stage I sacral ulcer present on admission  Goals of care conversation. Patient with progressively worsening pneumonia secondary to aspiration.  Oxygen requirements  high at  9 L. Remaining tachycardic tachypneic and now with worsening mentation, AKI with serum creatinine 1.88 and BUN 81 and potassium of 5.7. With ongoing respiratory distress. Patient with clear desire to remain DNR/DNI and avoid a feeding tube based on her goals of care documentation. Discussed with multiple family members again on 7/27. Currently transitioning to comfort care. Given her respiratory distress recommended low-dose IV Dilaudid  0.2 mg rate infusion. Family currently agrees. Continue as needed IV Dilaudid , IV Ativan , IV Haldol  and IV Robinul . Add scopolamine  patch. Unrestricted visitation. Anticipating hospital death.

## 2023-11-30 NOTE — ED Triage Notes (Signed)
 Pt brought to the ED from home for a mechanical fall. Pt has an obvious hip deformity. Pt denies any pain at this time. Pt denies any LOC and is alert and oriented x4. Granddaughter at bedside and states that she saw the pt fall. She was going to bring her to the hospital until she saw the pt's hip.

## 2023-11-30 NOTE — H&P (Signed)
 History and Physical    Alexandria Wright FMW:[id number] DOB: [birth year] DOA: 11/30/2023  PCP: Caro Harlene POUR, NP  Patient coming from: Home  I have personally briefly reviewed patient's old medical records in [facility]  Chief Complaint: Right hip pain after fall  HPI: Alexandria Wright is a 89 y.o. female with medical history significant for history of CVA, tachy-brady syndrome s/p PPM who presented to the ED for evaluation of right hip deformity after fall at home.  Patient states that she normally ambulates on her own power.  She was walking up her driveway earlier today.  She turned around to look at something when she lost her balance and fell onto her right hip.  She did not hit her head or lose consciousness.  She had no associated lightheadedness, dizziness, chest pain, dyspnea, nausea, vomiting.  She had immediate pain when she fell and was unable to stand on her own.  She came to the ED for further evaluation.  ED Course  Labs/Imaging on admission: I have personally reviewed following labs and imaging studies.  Initial vitals showed BP 145/70, pulse 86, RR 16, temp 97.7 F, SpO2 97% on room air.  Labs showed WBC 26.4, hemoglobin 12.2, platelets 311, sodium 138, potassium 4.7, bicarb 30, BUN 22, creatinine 0.85, serum glucose 131.  Right hip x-ray showed comminuted, angulated, and displaced fracture of the right femoral neck.  EDP discussed with on-call orthopedics (Dr. Jerri) who recommended medical admission to [facility] and will plan on surgical repair tomorrow.  The hospitalist service was consulted to admit.  Review of Systems: All systems reviewed and are negative except as documented in history of present illness above.   Past Medical History:  Diagnosis Date   Bradycardia    a. s/p MDT dual chamber PPM    Esophageal stricture    GERD (gastroesophageal reflux disease)    Skin cancer    Syncope    Tachycardia    asyptomatic nonsustained    Past  Surgical History:  Procedure Laterality Date   CATARACT EXTRACTION  12/10/2011   right    CATARACT EXTRACTION  12/17/2011   left   PACEMAKER PLACEMENT  2008   MDT dual chamber PPM implanted by Dr Fernande for symptomatic bradycardia   PPM GENERATOR CHANGEOUT N/A 03/04/2017   Procedure: PPM GENERATOR CHANGEOUT;  Surgeon: Fernande Elspeth BROCKS, MD;  Location: [facility] INVASIVE CV LAB;  Service: Cardiovascular;  Laterality: N/A;   SKIN CANCER EXCISION     TONSILLECTOMY AND ADENOIDECTOMY      Social History: Social History   Tobacco Use   Smoking status: Never   Smokeless tobacco: Never  Vaping Use   Vaping status: Never Used  Substance Use Topics   Alcohol use: No   Drug use: No   Allergies  Allergen Reactions   Sulfonamide Derivatives Other (See Comments)    Made me very sick, per the patient    Penicillins Swelling and Rash    Has patient had a PCN reaction causing immediate rash, facial/tongue/throat swelling, SOB or lightheadedness with hypotension: Yes Has patient had a PCN reaction causing severe rash involving mucus membranes or skin necrosis: Yes Has patient had a PCN reaction that required hospitalization: No Has patient had a PCN reaction occurring within the last 10 years: No If all of the above answers are NO, then may proceed with Cephalosporin use.     Family History  Problem Relation Age of Onset   Heart disease Mother  Colon cancer Neg Hx      Prior to Admission medications   Medication Sig Start Date End Date Taking? Authorizing Provider  aspirin  EC 81 MG tablet Take 1 tablet (81 mg total) by mouth daily. Swallow whole. 09/28/23   Caro Harlene POUR, NP  Calcium  Carbonate-Vitamin D (OSCAL 500/200 D-3 PO) Take 1 tablet by mouth daily with breakfast.    [provider]  Cholecalciferol (VITAMIN D3) 50 MCG (2000 UT) TABS Take 2,000 Units by mouth daily.    [provider]  clopidogrel  (PLAVIX ) 75 MG tablet TAKE 1 TABLET BY MOUTH EVERY DAY 08/24/23    Eubanks, Jessica K, NP  Multiple Vitamins-Minerals (CENTRUM MINIS WOMEN 50+ PO) Take 1 tablet by mouth daily.    [provider]  Propylene Glycol (SYSTANE COMPLETE) 0.6 % SOLN Apply 1 drop to eye daily.    [provider]  rosuvastatin  (CRESTOR ) 10 MG tablet TAKE 1 TABLET BY MOUTH EVERY DAY 09/22/23   Caro Harlene POUR, NP    Physical Exam: Vitals:   11/30/23 1541 11/30/23 1601 11/30/23 1855 11/30/23 2012  BP: (!) 145/70  124/75   Pulse: 86  98 87  Resp: 16  19 (!) 22  Temp: 97.7 F (36.5 C)   (!) 97.4 F (36.3 C)  SpO2: 97%  96% 94%  Weight:  34.5 kg    Height:  5' 5 (1.651 m)     Constitutional: Thin elderly woman resting supine in bed.  NAD, calm, comfortable Eyes: EOMI, lids and conjunctivae normal ENMT: Mucous membranes are moist. Posterior pharynx clear of any exudate or lesions.Normal dentition.  Neck: normal, supple, no masses. Respiratory: clear to auscultation bilaterally, no wheezing, no crackles. Normal respiratory effort. No accessory muscle use.  Cardiovascular: Regular rate and rhythm, no murmurs / rubs / gallops. No extremity edema. 2+ pedal pulses.  PPM in place left upper chest. Abdomen: no tenderness, no masses palpated. Musculoskeletal: Right hip deformity, RLE is externally rotated and shortened.  Muscle wasting throughout. Skin: no rashes, lesions, ulcers. No induration Neurologic: Sensation intact. Strength diminished RLE due to right hip fracture otherwise intact other extremities. Psychiatric: Normal judgment and insight. Alert and oriented x 3. Normal mood.   EKG: Personally reviewed. Sinus rhythm, rate 88, no acute ischemic changes.  Assessment/Plan Principal Problem:   Closed displaced intertrochanteric fracture of right femur, initial encounter (HCC) Active Problems:   Tachy-brady syndrome (HCC)   History of CVA (cerebrovascular accident)   Alexandria Wright is a 89 y.o. female with medical history significant for history of  CVA, tachy-brady syndrome s/p PPM who is admitted with right hip fracture.  Assessment and Plan: Acute displaced right femoral neck fracture: Occurring after mechanical fall.  Orthopedics consulted, Dr. Jerri recommends admission to [facility] with plan for surgical fixation tomorrow.  Keep n.p.o. after midnight.  Continue analgesics as needed.  Leukocytosis: Suspect reactive process.  Patient has no infectious signs/symptoms.  Monitor.  History of CVA: Continue rosuvastatin .  Holding aspirin .  Tachy-brady syndrome s/p PPM: In sinus rhythm.   DVT prophylaxis: SCDs Start: 11/30/23 1918 Code Status:   Code Status: Limited: Do not attempt resuscitation (DNR) -DNR-LIMITED -Do Not Intubate/DNI   confirmed with patient on admission Family Communication: Niece and grandniece at bedside Disposition Plan: From home, dispo pending clinical progress Consults called: Orthopedics Severity of Illness: The appropriate patient status for this patient is INPATIENT. Inpatient status is judged to be reasonable and necessary in order to provide the required intensity of service  to ensure the patient's safety. The patient's presenting symptoms, physical exam findings, and initial radiographic and laboratory data in the context of their chronic comorbidities is felt to place them at high risk for further clinical deterioration. Furthermore, it is not anticipated that the patient will be medically stable for discharge from the hospital within 2 midnights of admission.   * I certify that at the point of admission it is my clinical judgment that the patient will require inpatient hospital care spanning beyond 2 midnights from the point of admission due to high intensity of service, high risk for further deterioration and high frequency of surveillance required.DEWAINE Jorie Blanch MD [facility]  If 7PM-7AM, please contact night-coverage [email or web address]  11/30/2023, 8:21 PM

## 2023-12-01 ENCOUNTER — Other Ambulatory Visit: Payer: Self-pay

## 2023-12-01 ENCOUNTER — Inpatient Hospital Stay (HOSPITAL_COMMUNITY): Admitting: Anesthesiology

## 2023-12-01 ENCOUNTER — Encounter (HOSPITAL_COMMUNITY): Payer: Self-pay | Admitting: Internal Medicine

## 2023-12-01 ENCOUNTER — Encounter (HOSPITAL_COMMUNITY): Admission: EM | Disposition: E | Payer: Self-pay | Source: Home / Self Care | Attending: Internal Medicine

## 2023-12-01 ENCOUNTER — Inpatient Hospital Stay (HOSPITAL_COMMUNITY)

## 2023-12-01 DIAGNOSIS — S72001A Fracture of unspecified part of neck of right femur, initial encounter for closed fracture: Secondary | ICD-10-CM

## 2023-12-01 DIAGNOSIS — I10 Essential (primary) hypertension: Secondary | ICD-10-CM | POA: Diagnosis not present

## 2023-12-01 DIAGNOSIS — E43 Unspecified severe protein-calorie malnutrition: Secondary | ICD-10-CM | POA: Insufficient documentation

## 2023-12-01 DIAGNOSIS — S72141A Displaced intertrochanteric fracture of right femur, initial encounter for closed fracture: Secondary | ICD-10-CM

## 2023-12-01 DIAGNOSIS — E7800 Pure hypercholesterolemia, unspecified: Secondary | ICD-10-CM | POA: Diagnosis not present

## 2023-12-01 HISTORY — PX: INTRAMEDULLARY (IM) NAIL INTERTROCHANTERIC: SHX5875

## 2023-12-01 LAB — CBC
HCT: 35.1 % — ABNORMAL LOW (ref 36.0–46.0)
Hemoglobin: 11.2 g/dL — ABNORMAL LOW (ref 12.0–15.0)
MCH: 32.3 pg (ref 26.0–34.0)
MCHC: 31.9 g/dL (ref 30.0–36.0)
MCV: 101.2 fL — ABNORMAL HIGH (ref 80.0–100.0)
Platelets: 248 K/uL (ref 150–400)
RBC: 3.47 MIL/uL — ABNORMAL LOW (ref 3.87–5.11)
RDW: 14.2 % (ref 11.5–15.5)
WBC: 16.7 K/uL — ABNORMAL HIGH (ref 4.0–10.5)
nRBC: 0 % (ref 0.0–0.2)

## 2023-12-01 LAB — BASIC METABOLIC PANEL WITH GFR
Anion gap: 13 (ref 5–15)
BUN: 25 mg/dL — ABNORMAL HIGH (ref 8–23)
CO2: 23 mmol/L (ref 22–32)
Calcium: 8.8 mg/dL — ABNORMAL LOW (ref 8.9–10.3)
Chloride: 99 mmol/L (ref 98–111)
Creatinine, Ser: 0.95 mg/dL (ref 0.44–1.00)
GFR, Estimated: 57 mL/min — ABNORMAL LOW (ref >60)
Glucose, Bld: 107 mg/dL — ABNORMAL HIGH (ref 70–99)
Potassium: 5.1 mmol/L (ref 3.5–5.1)
Sodium: 135 mmol/L (ref 135–145)

## 2023-12-01 LAB — SURGICAL PCR SCREEN
MRSA, PCR: NEGATIVE
Staphylococcus aureus: NEGATIVE

## 2023-12-01 LAB — FOLATE: Folate: 17.9 ng/mL (ref >5.9)

## 2023-12-01 LAB — VITAMIN B12: Vitamin B-12: 427 pg/mL (ref 180–914)

## 2023-12-01 SURGERY — FIXATION, FRACTURE, INTERTROCHANTERIC, WITH INTRAMEDULLARY ROD
Anesthesia: Monitor Anesthesia Care | Site: Hip | Laterality: Right

## 2023-12-01 MED ORDER — ACETAMINOPHEN 500 MG PO TABS
500.0000 mg | ORAL_TABLET | Freq: Four times a day (QID) | ORAL | Status: AC
Start: 2023-12-01 — End: 2023-12-02
  Administered 2023-12-02 (×2): 500 mg via ORAL
  Filled 2023-12-01 (×3): qty 1

## 2023-12-01 MED ORDER — ONDANSETRON HCL 4 MG/2ML IJ SOLN
4.0000 mg | Freq: Four times a day (QID) | INTRAMUSCULAR | Status: DC | PRN
Start: 2023-12-01 — End: 2023-12-06

## 2023-12-01 MED ORDER — PROPOFOL 1000 MG/100ML IV EMUL
INTRAVENOUS | Status: AC
  Filled 2023-12-01: qty 100

## 2023-12-01 MED ORDER — HYDROCODONE-ACETAMINOPHEN 5-325 MG PO TABS
1.0000 | ORAL_TABLET | ORAL | Status: DC | PRN
  Administered 2023-12-01 – 2023-12-02 (×4): 1 via ORAL
  Filled 2023-12-01 (×4): qty 1

## 2023-12-01 MED ORDER — DOCUSATE SODIUM 100 MG PO CAPS
100.0000 mg | ORAL_CAPSULE | Freq: Two times a day (BID) | ORAL | Status: DC
  Administered 2023-12-01 – 2023-12-02 (×2): 100 mg via ORAL
  Filled 2023-12-01 (×2): qty 1

## 2023-12-01 MED ORDER — BUPIVACAINE IN DEXTROSE 0.75-8.25 % IT SOLN
INTRATHECAL | Status: DC | PRN
  Administered 2023-12-01: 1.2 mL via INTRATHECAL

## 2023-12-01 MED ORDER — ONDANSETRON HCL 4 MG PO TABS: 4.0000 mg | ORAL_TABLET | Freq: Four times a day (QID) | ORAL | Status: DC | PRN

## 2023-12-01 MED ORDER — SORBITOL 70 % SOLN: 30.0000 mL | Freq: Every day | Status: DC | PRN

## 2023-12-01 MED ORDER — ORAL CARE MOUTH RINSE: 15.0000 mL | Freq: Once | OROMUCOSAL | Status: AC

## 2023-12-01 MED ORDER — MORPHINE SULFATE (PF) 2 MG/ML IV SOLN
0.5000 mg | INTRAVENOUS | Status: DC | PRN
  Administered 2023-12-01: 0.5 mg via INTRAVENOUS
  Administered 2023-12-03: 1 mg via INTRAVENOUS
  Filled 2023-12-01 (×2): qty 1

## 2023-12-01 MED ORDER — SODIUM CHLORIDE 0.9 % IV SOLN: INTRAVENOUS | Status: DC

## 2023-12-01 MED ORDER — PHENYLEPHRINE HCL-NACL 20-0.9 MG/250ML-% IV SOLN
INTRAVENOUS | Status: DC | PRN
  Administered 2023-12-01: 30 ug/min via INTRAVENOUS

## 2023-12-01 MED ORDER — POLYETHYLENE GLYCOL 3350 17 G PO PACK
17.0000 g | PACK | Freq: Two times a day (BID) | ORAL | Status: DC
  Administered 2023-12-01: 17 g via ORAL
  Filled 2023-12-01: qty 1

## 2023-12-01 MED ORDER — ACETAMINOPHEN 500 MG PO TABS
ORAL_TABLET | ORAL | Status: AC
  Administered 2023-12-01: 500 mg via ORAL
  Filled 2023-12-01: qty 1

## 2023-12-01 MED ORDER — CHLORHEXIDINE GLUCONATE 0.12 % MT SOLN
OROMUCOSAL | Status: AC
  Administered 2023-12-01: 15 mL via OROMUCOSAL
  Filled 2023-12-01: qty 15

## 2023-12-01 MED ORDER — CHLORHEXIDINE GLUCONATE 0.12 % MT SOLN: 15.0000 mL | Freq: Once | OROMUCOSAL | Status: AC

## 2023-12-01 MED ORDER — FENTANYL CITRATE (PF) 100 MCG/2ML IJ SOLN: 25.0000 ug | INTRAMUSCULAR | Status: DC | PRN

## 2023-12-01 MED ORDER — POLYETHYLENE GLYCOL 3350 17 G PO PACK
17.0000 g | PACK | Freq: Every day | ORAL | Status: DC | PRN
Start: 2023-12-01 — End: 2023-12-06

## 2023-12-01 MED ORDER — METHOCARBAMOL 500 MG PO TABS
500.0000 mg | ORAL_TABLET | Freq: Four times a day (QID) | ORAL | Status: DC | PRN
  Administered 2023-12-01 – 2023-12-02 (×2): 500 mg via ORAL
  Filled 2023-12-01 (×2): qty 1

## 2023-12-01 MED ORDER — 0.9 % SODIUM CHLORIDE (POUR BTL) OPTIME
TOPICAL | Status: DC | PRN
  Administered 2023-12-01: 1000 mL

## 2023-12-01 MED ORDER — CHLORHEXIDINE GLUCONATE 4 % EX SOLN
60.0000 mL | Freq: Once | CUTANEOUS | Status: AC
  Administered 2023-12-01: 4 via TOPICAL
  Filled 2023-12-01: qty 60

## 2023-12-01 MED ORDER — STERILE WATER FOR IRRIGATION IR SOLN
Status: DC | PRN
  Administered 2023-12-01: 1000 mL

## 2023-12-01 MED ORDER — MENTHOL 3 MG MT LOZG
1.0000 | LOZENGE | OROMUCOSAL | Status: DC | PRN
Start: 2023-12-01 — End: 2023-12-07

## 2023-12-01 MED ORDER — CEFAZOLIN SODIUM-DEXTROSE 2-4 GM/100ML-% IV SOLN
2.0000 g | Freq: Four times a day (QID) | INTRAVENOUS | Status: AC
  Administered 2023-12-01 – 2023-12-02 (×3): 2 g via INTRAVENOUS
  Filled 2023-12-01 (×3): qty 100

## 2023-12-01 MED ORDER — LIDOCAINE 2% (20 MG/ML) 5 ML SYRINGE
INTRAMUSCULAR | Status: AC
  Filled 2023-12-01: qty 5

## 2023-12-01 MED ORDER — ACETAMINOPHEN 325 MG PO TABS: 325.0000 mg | ORAL_TABLET | Freq: Four times a day (QID) | ORAL | Status: DC | PRN

## 2023-12-01 MED ORDER — ONDANSETRON HCL 4 MG/2ML IJ SOLN: 4.0000 mg | Freq: Once | INTRAMUSCULAR | Status: DC | PRN

## 2023-12-01 MED ORDER — ALUM & MAG HYDROXIDE-SIMETH 200-200-20 MG/5ML PO SUSP: 30.0000 mL | ORAL | Status: DC | PRN

## 2023-12-01 MED ORDER — PHENOL 1.4 % MT LIQD: 1.0000 | OROMUCOSAL | Status: DC | PRN

## 2023-12-01 MED ORDER — PROPOFOL 10 MG/ML IV BOLUS
INTRAVENOUS | Status: DC | PRN
  Administered 2023-12-01: 10 mg via INTRAVENOUS
  Administered 2023-12-01: 5 mg via INTRAVENOUS
  Administered 2023-12-01: 40 ug/kg/min via INTRAVENOUS

## 2023-12-01 MED ORDER — LACTATED RINGERS IV SOLN: INTRAVENOUS | Status: DC

## 2023-12-01 MED ORDER — BUPIVACAINE LIPOSOME 1.3 % IJ SUSP
INTRAMUSCULAR | Status: DC | PRN
Start: 2023-12-01 — End: 2023-12-01

## 2023-12-01 MED ORDER — ACETAMINOPHEN 500 MG PO TABS: 500.0000 mg | ORAL_TABLET | Freq: Once | ORAL | Status: AC

## 2023-12-01 MED ORDER — HYDROCODONE-ACETAMINOPHEN 7.5-325 MG PO TABS
1.0000 | ORAL_TABLET | ORAL | Status: DC | PRN
  Administered 2023-12-02: 2 via ORAL
  Filled 2023-12-01: qty 1
  Filled 2023-12-01: qty 2

## 2023-12-01 MED ORDER — ENOXAPARIN SODIUM 30 MG/0.3ML IJ SOSY
30.0000 mg | PREFILLED_SYRINGE | INTRAMUSCULAR | Status: DC
  Administered 2023-12-02 – 2023-12-05 (×3): 30 mg via SUBCUTANEOUS
  Filled 2023-12-01 (×5): qty 0.3

## 2023-12-01 MED ORDER — ASPIRIN 81 MG PO TBEC
81.0000 mg | DELAYED_RELEASE_TABLET | Freq: Every day | ORAL | Status: DC
  Administered 2023-12-01 – 2023-12-03 (×3): 81 mg via ORAL
  Filled 2023-12-01 (×4): qty 1

## 2023-12-01 MED ORDER — POVIDONE-IODINE 10 % EX SWAB
2.0000 | Freq: Once | CUTANEOUS | Status: AC
  Administered 2023-12-01: 2 via TOPICAL

## 2023-12-01 MED ORDER — METHOCARBAMOL 1000 MG/10ML IJ SOLN: 500.0000 mg | Freq: Four times a day (QID) | INTRAMUSCULAR | Status: DC | PRN

## 2023-12-01 MED ORDER — CLOPIDOGREL BISULFATE 75 MG PO TABS: 75.0000 mg | ORAL_TABLET | Freq: Every day | ORAL | Status: DC

## 2023-12-01 MED ORDER — MAGNESIUM CITRATE PO SOLN: 1.0000 | Freq: Once | ORAL | Status: DC | PRN

## 2023-12-01 MED ORDER — CEFAZOLIN SODIUM-DEXTROSE 2-4 GM/100ML-% IV SOLN
2.0000 g | INTRAVENOUS | Status: AC
  Administered 2023-12-01: 2 g via INTRAVENOUS
  Filled 2023-12-01: qty 100

## 2023-12-01 MED ORDER — TRANEXAMIC ACID-NACL 1000-0.7 MG/100ML-% IV SOLN
1000.0000 mg | Freq: Once | INTRAVENOUS | Status: AC
  Administered 2023-12-01: 1000 mg via INTRAVENOUS
  Filled 2023-12-01: qty 100

## 2023-12-01 SURGICAL SUPPLY — 40 items
BAG COUNTER SPONGE SURGICOUNT (BAG) ×2 IMPLANT
BIT DRILL INTERTAN LAG SCREW (BIT) IMPLANT
BIT DRILL SHORT 4.0 (BIT) IMPLANT
BNDG COHESIVE 4X5 TAN STRL LF (GAUZE/BANDAGES/DRESSINGS) ×2 IMPLANT
BNDG COHESIVE 6X5 TAN ST LF (GAUZE/BANDAGES/DRESSINGS) IMPLANT
BNDG GAUZE DERMACEA FLUFF 4 (GAUZE/BANDAGES/DRESSINGS) ×2 IMPLANT
COVER PERINEAL POST (MISCELLANEOUS) ×2 IMPLANT
COVER SURGICAL LIGHT HANDLE (MISCELLANEOUS) ×2 IMPLANT
DRAPE C-ARMOR (DRAPES) ×2 IMPLANT
DRAPE STERI IOBAN 125X83 (DRAPES) ×2 IMPLANT
DRESSING MEPILEX FLEX 4X4 (GAUZE/BANDAGES/DRESSINGS) ×2 IMPLANT
DRSG MEPILEX POST OP 4X8 (GAUZE/BANDAGES/DRESSINGS) ×2 IMPLANT
DURAPREP 26ML APPLICATOR (WOUND CARE) ×2 IMPLANT
ELECTRODE REM PT RTRN 9FT ADLT (ELECTROSURGICAL) ×2 IMPLANT
GAUZE PAD ABD 8X10 STRL (GAUZE/BANDAGES/DRESSINGS) ×4 IMPLANT
GLOVE BIOGEL PI IND STRL 7.0 (GLOVE) ×4 IMPLANT
GLOVE BIOGEL PI IND STRL 7.5 (GLOVE) ×2 IMPLANT
GLOVE ECLIPSE 7.0 STRL STRAW (GLOVE) ×2 IMPLANT
GLOVE SKINSENSE STRL SZ7.5 (GLOVE) ×4 IMPLANT
GLOVE SURG SYN 7.5 PF PI (GLOVE) ×4 IMPLANT
GLOVE SURG UNDER POLY LF SZ7 (GLOVE) ×38 IMPLANT
GLOVE SURG UNDER POLY LF SZ7.5 (GLOVE) ×8 IMPLANT
GOWN STRL SURGICAL XL XLNG (GOWN DISPOSABLE) ×2 IMPLANT
KIT BASIN OR (CUSTOM PROCEDURE TRAY) ×2 IMPLANT
KIT TURNOVER KIT B (KITS) ×2 IMPLANT
MANIFOLD NEPTUNE II (INSTRUMENTS) ×2 IMPLANT
NAIL TRIGEN 10MMX36CM-125 RT (Nail) IMPLANT
NS IRRIG 1000ML POUR BTL (IV SOLUTION) ×2 IMPLANT
PACK GENERAL/GYN (CUSTOM PROCEDURE TRAY) ×2 IMPLANT
PAD ARMBOARD POSITIONER FOAM (MISCELLANEOUS) ×4 IMPLANT
PAD CAST 4YDX4 CTTN HI CHSV (CAST SUPPLIES) ×4 IMPLANT
PIN GUIDE 3.2X343MM (PIN) IMPLANT
SCREW LAG COMPR KIT 90/85 (Screw) IMPLANT
SCREW TRIGEN LOW PROF 5.0X35 (Screw) IMPLANT
STAPLER SKIN PROX 35W (STAPLE) ×2 IMPLANT
SUT VIC AB 0 CT1 27XBRD ANBCTR (SUTURE) ×2 IMPLANT
SUT VIC AB 2-0 CT1 TAPERPNT 27 (SUTURE) ×2 IMPLANT
TOWEL GREEN STERILE (TOWEL DISPOSABLE) ×2 IMPLANT
TOWEL GREEN STERILE FF (TOWEL DISPOSABLE) ×2 IMPLANT
WATER STERILE IRR 1000ML POUR (IV SOLUTION) ×2 IMPLANT

## 2023-12-01 NOTE — Progress Notes (Signed)
 Initial Nutrition Assessment  DOCUMENTATION CODES:   Severe malnutrition in context of chronic illness, Underweight (esophageal strictures, CVA hx, GERD, tachy-brady syndrome)  INTERVENTION:  When pt placed on diet following surgery, recommend: Liberalized diet (regular)  Ensure Plus High Protein po TID, each supplement provides 350 kcal and 20 grams of protein.  Magic cup TID with meals, each supplement provides 290 kcal and 9 grams of protein  Encouraged adequate intake of meals to help meet increased protein and calorie needs for post op healing and wound healing  MVI w/ minerals daily  Recommend SLP consult for swallow evaluation for esophageal strictures  NUTRITION DIAGNOSIS:   Severe Malnutrition related to chronic illness (esophageal strictures, CVA hx, GERD, tachy-brady syndrome) as evidenced by severe fat depletion, severe muscle depletion, energy intake < or equal to 75% for > or equal to 1 month.  GOAL:   Patient will meet greater than or equal to 90% of their needs  MONITOR:   PO intake, Supplement acceptance  REASON FOR ASSESSMENT:   Consult Hip fracture protocol  ASSESSMENT:   Pt with hx of syncope, CVA, tachycardia, bradycardia, GERD, and esophageal stricture. Pt admitted following fall in driveway with R hip fx requiring surgical intervention.  7/21 admitted 7/22 IMN of R hip  Spoke with pt who was awake and alert. Pt's great niece at bedside. Pt currently NPO for surgery (IMN scheduled later today). Pt reports PTA she had a fair appetite. Pt reports eating 2 meals per day and a snack. Breakfast typically consisted of cheerios and fruit. Pt reports next meal would be mid afternoon and consisted of [employer] frozen meal or frozen chickfila meal. Pt reports she has very supportive family who ensures her freezer is stocked with easily reheatable meals for her afternoon meal. Pt's snack would include a Hershey bar or ice cream. Pt also reports drinking 1 ensure  per day PTA, but reports PCP encouraged her to drink more due to pt being malnourished. Discussed increasing Ensure intake to 3/day to help supplement calories and protein, pt stated she would try while admitted. Pt reports adding orange powder to drinks that would help with bowel regularity, suspect pt is talking about Metamucil. Pt reports she is fairly picky with meals due to having hx of esophageal strictures which cause swallowing issues for pt. Pt reports being very cautious when eating and tucks neck backwards to help ensure proper swallow when eating.   Pt reports gradual decline in wt over last 10 years and reports she has always been petite. Per chart review pt has lost 10% wt in last year which is not clinically significant for the timeframe, but given pt's very low BMI is significant for whole clinical picture and functional status. Suspect pt's ht in chart not accurate. Pt reports her tallest ht when she was younger as 5'6, but reports she has shrunk over the years and remembers last ht at PCP recorded as 5'3. Even with adjustment in ht, pt's BMI remains 13.5. Nutrition focused physical exam showed severe fat and muscle depletions, indicative of severe chronic malnutrition. Discussed importance of preventing further wt loss, pt and niece express understanding. Pt had been independent PTA and lived by herself and did not rely on assistive devices for mobility.   Medications reviewed and include:  Miralax   Labs reviewed  NUTRITION - FOCUSED PHYSICAL EXAM:  Flowsheet Row Most Recent Value  Orbital Region Severe depletion  Upper Arm Region Severe depletion  Thoracic and Lumbar Region Severe depletion  Buccal  Region Severe depletion  Temple Region Severe depletion  Clavicle Bone Region Severe depletion  Clavicle and Acromion Bone Region Severe depletion  Scapular Bone Region Severe depletion  Dorsal Hand Severe depletion  Patellar Region Severe depletion  Anterior Thigh Region Severe  depletion  Posterior Calf Region Severe depletion  Edema (RD Assessment) None  Hair Reviewed  Eyes Reviewed  Mouth Reviewed  Skin Reviewed  Nails Reviewed    Diet Order:   Diet Order             Diet NPO time specified Except for: Sips with Meds, Ice Chips  Diet effective now                   EDUCATION NEEDS:   Education needs have been addressed  Skin:  Skin Assessment: Skin Integrity Issues: Skin Integrity Issues:: Stage I Stage I: sacrum  Last BM:  PTA  Height:   Ht Readings from Last 1 Encounters:  11/30/23 5' 5 (1.651 m)   Weight:   Wt Readings from Last 1 Encounters:  11/30/23 34.5 kg    Ideal Body Weight:  56.8 kg  BMI:  Body mass index is 12.66 kg/m.  Estimated Nutritional Needs:   Kcal:  1400-1600  Protein:  70-90g  Fluid:  1.4-1.6L   Josette Glance, MS, RDN, LDN Clinical Dietitian I Please reach out via secure chat

## 2023-12-01 NOTE — H&P (Signed)

## 2023-12-01 NOTE — Anesthesia Procedure Notes (Addendum)
 Spinal  Patient location during procedure: OR Start time: 12/01/2023 3:55 PM End time: 12/01/2023 4:00 PM Reason for block: surgical anesthesia Staffing Performed: anesthesiologist  Anesthesiologist: Merla Almarie HERO, DO Performed by: Merla Almarie HERO, DO Authorized by: Merla Almarie HERO, DO   Preanesthetic Checklist Completed: patient identified, IV checked, risks and benefits discussed, surgical consent, monitors and equipment checked, pre-op  evaluation and timeout performed Spinal Block Patient position: right lateral decubitus Prep: DuraPrep and site prepped and draped Patient monitoring: cardiac monitor, continuous pulse ox and blood pressure Approach: midline Location: L3-4 Injection technique: single-shot Needle Needle type: Pencan  Needle gauge: 24 G Needle length: 9 cm Assessment Sensory level: T6 Events: CSF return Additional Notes Functioning IV was confirmed and monitors were applied. Sterile prep and drape, including hand hygiene and sterile gloves were used. The patient was positioned and the spine was prepped. The skin was anesthetized with lidocaine .  Free flow of clear CSF was obtained prior to injecting local anesthetic into the CSF.  The spinal needle aspirated freely following injection.  The needle was carefully withdrawn.  The patient tolerated the procedure well.

## 2023-12-01 NOTE — Transfer of Care (Signed)
 Immediate Anesthesia Transfer of Care Note  Patient: Alexandria Wright  Procedure(s) Performed: FIXATION, FRACTURE, INTERTROCHANTERIC, WITH INTRAMEDULLARY ROD (Right: Hip)  Patient Location: PACU  Anesthesia Type:Spinal  Level of Consciousness: awake and alert   Airway & Oxygen Therapy: Patient Spontanous Breathing and Patient connected to nasal cannula oxygen  Post-op Assessment: Report given to RN and Post -op Vital signs reviewed and stable  Post vital signs: Reviewed and stable  Last Vitals:  Vitals Value Taken Time  BP 89/46 12/01/23 17:30  Temp 97.6   Pulse 78 12/01/23 17:33  Resp 17 12/01/23 17:33  SpO2 90 % 12/01/23 17:33  Vitals shown include unfiled device data.  Last Pain:  Vitals:   12/01/23 1417  TempSrc: Oral  PainSc: 5          Complications: No notable events documented.

## 2023-12-01 NOTE — Anesthesia Preprocedure Evaluation (Addendum)
 Anesthesia Evaluation  Patient identified by MRN, date of birth, ID band Patient awake    Reviewed: Allergy  & Precautions, NPO status , Patient's Chart, lab work & pertinent test results  Airway Mallampati: II  TM Distance: >3 FB Neck ROM: Full    Dental no notable dental hx. (+) Poor Dentition, Dental Advisory Given, Edentulous Upper   Pulmonary neg pulmonary ROS   Pulmonary exam normal breath sounds clear to auscultation       Cardiovascular hypertension (145/74 preop), Normal cardiovascular exam+ pacemaker (PPM for bradycardia) + Valvular Problems/Murmurs (mild-mod MR) MR  Rhythm:Regular Rate:Normal  Echo 05/2023 . Left ventricular ejection fraction, by estimation, is 60 to 65%. The  left ventricle has normal function. The left ventricle has no regional  wall motion abnormalities. Left ventricular diastolic parameters are  consistent with Grade I diastolic  dysfunction (impaired relaxation). Elevated left atrial pressure.   2. Right ventricular systolic function is normal. The right ventricular  size is normal.   3. The mitral valve is myxomatous. Mild to moderate mitral valve  regurgitation. No evidence of mitral stenosis. There is mild holosystolic  prolapse of the middle segment of the anterior leaflet of the mitral  valve.   4. The aortic valve is tricuspid. There is mild calcification of the  aortic valve. Aortic valve regurgitation is not visualized. Aortic valve  sclerosis/calcification is present, without any evidence of aortic  stenosis.   5. The inferior vena cava is normal in size with greater than 50%  respiratory variability, suggesting right atrial pressure of 3 mmHg.     Neuro/Psych CVA (plavix  LD:)  negative psych ROS   GI/Hepatic Neg liver ROS,GERD  ,,Esophageal stricture    Endo/Other  negative endocrine ROS    Renal/GU negative Renal ROS  negative genitourinary   Musculoskeletal negative  musculoskeletal ROS (+)    Abdominal   Peds  Hematology negative hematology ROS (+) Hb 11.2, plt 248   Anesthesia Other Findings   Reproductive/Obstetrics negative OB ROS                              Anesthesia Physical Anesthesia Plan  ASA: 3  Anesthesia Plan: Spinal and MAC   Post-op Pain Management: Tylenol  PO (pre-op )*   Induction:   PONV Risk Score and Plan: 2 and Propofol  infusion and TIVA  Airway Management Planned: Natural Airway and Nasal Cannula  Additional Equipment: None  Intra-op Plan:   Post-operative Plan:   Informed Consent: I have reviewed the patients History and Physical, chart, labs and discussed the procedure including the risks, benefits and alternatives for the proposed anesthesia with the patient or authorized representative who has indicated his/her understanding and acceptance.   Patient has DNR.  Discussed DNR with patient and Continue DNR.     Plan Discussed with: CRNA  Anesthesia Plan Comments:          Anesthesia Quick Evaluation

## 2023-12-01 NOTE — Consult Note (Signed)
 Reason for Consult:Right hip fx Referring Physician: Erle Odell Castor Time called: 0730 Time at bedside: 0911   Alexandria Wright is an 89 y.o. female.  HPI: [name] was in her driveway, lost her balance, and fell. She had immediate right hip pain and could not get up. She was brought to [facility] where x-rays showed a right hip fx and orthopedic surgery was consulted. Due to OR availability she was transferred to [facility] for surgery. She lives at home alone and does not use any assistive devices for ambulation.  Past Medical History:  Diagnosis Date   Bradycardia    a. s/p MDT dual chamber PPM    Esophageal stricture    GERD (gastroesophageal reflux disease)    Skin cancer    Syncope    Tachycardia    asyptomatic nonsustained    Past Surgical History:  Procedure Laterality Date   CATARACT EXTRACTION  12/10/2011   right    CATARACT EXTRACTION  12/17/2011   left   PACEMAKER PLACEMENT  2008   MDT dual chamber PPM implanted by Dr Fernande for symptomatic bradycardia   PPM GENERATOR CHANGEOUT N/A 03/04/2017   Procedure: PPM GENERATOR CHANGEOUT;  Surgeon: Fernande Elspeth BROCKS, MD;  Location: [facility] INVASIVE CV LAB;  Service: Cardiovascular;  Laterality: N/A;   SKIN CANCER EXCISION     TONSILLECTOMY AND ADENOIDECTOMY      Family History  Problem Relation Age of Onset   Heart disease Mother    Colon cancer Neg Hx     Social History:  reports that she has never smoked. She has never used smokeless tobacco. She reports that she does not drink alcohol and does not use drugs.  Allergies:  Allergies  Allergen Reactions   Sulfonamide Derivatives Other (See Comments)    Made me very sick, per the patient    Penicillins Swelling and Rash    Has patient had a PCN reaction causing immediate rash, facial/tongue/throat swelling, SOB or lightheadedness with hypotension: Yes Has patient had a PCN reaction causing severe rash involving mucus membranes or skin necrosis: Yes Has patient had a PCN reaction that  required hospitalization: No Has patient had a PCN reaction occurring within the last 10 years: No If all of the above answers are NO, then may proceed with Cephalosporin use.     Medications: I have reviewed the patient's current medications.  Results for orders placed or performed during the hospital encounter of 11/30/23 (from the past 48 hours)  Basic metabolic panel     Status: Abnormal   Collection Time: 11/30/23  4:51 PM  Result Value Ref Range   Sodium 138 135 - 145 mmol/L   Potassium 4.7 3.5 - 5.1 mmol/L   Chloride 98 98 - 111 mmol/L   CO2 30 22 - 32 mmol/L   Glucose, Bld 131 (H) 70 - 99 mg/dL    Comment: Glucose reference range applies only to samples taken after fasting for at least 8 hours.   BUN 22 8 - 23 mg/dL   Creatinine, Ser 9.14 0.44 - 1.00 mg/dL   Calcium  9.2 8.9 - 10.3 mg/dL   GFR, Estimated >39 >39 mL/min    Comment: (NOTE) Calculated using the CKD-EPI Creatinine Equation (2021)    Anion gap 10 5 - 15    Comment: Performed at [facility], [address], [place], [place] [address]  CBC with Differential     Status: Abnormal   Collection Time: 11/30/23  4:51 PM  Result Value Ref  Range   WBC 26.4 (H) 4.0 - 10.5 K/uL   RBC 3.92 3.87 - 5.11 MIL/uL   Hemoglobin 12.2 12.0 - 15.0 g/dL   HCT 60.5 63.9 - 53.9 %   MCV 100.5 (H) 80.0 - 100.0 fL   MCH 31.1 26.0 - 34.0 pg   MCHC 31.0 30.0 - 36.0 g/dL   RDW 85.9 88.4 - 84.4 %   Platelets 311 150 - 400 K/uL   nRBC 0.0 0.0 - 0.2 %   Neutrophils Relative % 92 %   Neutro Abs 24.5 (H) 1.7 - 7.7 K/uL   Lymphocytes Relative 2 %   Lymphs Abs 0.4 (L) 0.7 - 4.0 K/uL   Monocytes Relative 5 %   Monocytes Absolute 1.3 (H) 0.1 - 1.0 K/uL   Eosinophils Relative 0 %   Eosinophils Absolute 0.0 0.0 - 0.5 K/uL   Basophils Relative 0 %   Basophils Absolute 0.1 0.0 - 0.1 K/uL   WBC Morphology MORPHOLOGY UNREMARKABLE    RBC Morphology MORPHOLOGY UNREMARKABLE    Smear Review MORPHOLOGY UNREMARKABLE     Immature Granulocytes 1 %   Abs Immature Granulocytes 0.14 (H) 0.00 - 0.07 K/uL    Comment: Performed at [facility], [address], [place], [place] [address]  Protime-INR     Status: None   Collection Time: 11/30/23  4:51 PM  Result Value Ref Range   Prothrombin Time 14.4 11.4 - 15.2 seconds   INR 1.1 0.8 - 1.2    Comment: (NOTE) INR goal varies based on device and disease states. Performed at [facility], [address], [place], [place] [address]   Type and screen [facility]     Status: None   Collection Time: 11/30/23  4:51 PM  Result Value Ref Range   ABO/RH(D) O NEG    Antibody Screen NEG    Sample Expiration      12/03/2023,2359 Performed at [facility], [address], [place], [place] [address]   Surgical pcr screen     Status: None   Collection Time: 12/01/23  5:44 AM   Specimen: Nasal Mucosa; Nasal Swab  Result Value Ref Range   MRSA, PCR NEGATIVE NEGATIVE   Staphylococcus aureus NEGATIVE NEGATIVE    Comment: (NOTE) The Xpert SA Assay (FDA approved for NASAL specimens in patients 52 years of age and older), is one component of a comprehensive surveillance program. It is not intended to diagnose infection nor to guide or monitor treatment. Performed at [facility], [address], [place], [place] [address]   Basic metabolic panel     Status: Abnormal   Collection Time: 12/01/23  6:25 AM  Result Value Ref Range   Sodium 135 135 - 145 mmol/L   Potassium 5.1 3.5 - 5.1 mmol/L   Chloride 99 98 - 111 mmol/L   CO2 23 22 - 32 mmol/L   Glucose, Bld 107 (H) 70 - 99 mg/dL    Comment: Glucose reference range applies only to samples taken after fasting for at least 8 hours.   BUN 25 (H) 8 - 23 mg/dL   Creatinine, Ser 9.04 0.44 - 1.00 mg/dL   Calcium  8.8 (L) 8.9 - 10.3 mg/dL   GFR, Estimated 57 (L) >60 mL/min    Comment: (NOTE) Calculated using the CKD-EPI Creatinine Equation  (2021)    Anion gap 13 5 - 15    Comment: Performed at [facility], [address],  [place], [place] [address]  CBC     Status: Abnormal   Collection Time: 12/01/23  6:42 AM  Result Value Ref Range   WBC 16.7 (H) 4.0 - 10.5 K/uL   RBC 3.47 (L) 3.87 - 5.11 MIL/uL   Hemoglobin 11.2 (L) 12.0 - 15.0 g/dL   HCT 64.8 (L) 63.9 - 53.9 %   MCV 101.2 (H) 80.0 - 100.0 fL   MCH 32.3 26.0 - 34.0 pg   MCHC 31.9 30.0 - 36.0 g/dL   RDW 85.7 88.4 - 84.4 %   Platelets 248 150 - 400 K/uL   nRBC 0.0 0.0 - 0.2 %    Comment: Performed at [facility], [address] [place], [place] [address]    DG HIP UNILAT WITH PELVIS 2-3 VIEWS RIGHT Result Date: 11/30/2023 CLINICAL DATA:  Fall and right hip pain. EXAM: DG HIP (WITH OR WITHOUT PELVIS) 2-3V RIGHT COMPARISON:  None Available. FINDINGS: There is a comminuted, angulated, and displaced fracture of the right femoral neck. No dislocation. The bones are osteopenic. The soft tissues are unremarkable. IMPRESSION: Comminuted, angulated, and displaced fracture of the right femoral neck. Electronically Signed   By: Vanetta Chou M.D.   On: 11/30/2023 17:05    Review of Systems  HENT:  Negative for ear discharge, ear pain, hearing loss and tinnitus.   Eyes:  Negative for photophobia and pain.  Respiratory:  Negative for cough and shortness of breath.   Cardiovascular:  Negative for chest pain.  Gastrointestinal:  Negative for abdominal pain, nausea and vomiting.  Genitourinary:  Negative for dysuria, flank pain, frequency and urgency.  Musculoskeletal:  Positive for arthralgias (Right hip). Negative for back pain, myalgias and neck pain.  Neurological:  Negative for dizziness and headaches.  Hematological:  Does not bruise/bleed easily.  Psychiatric/Behavioral:  The patient is not nervous/anxious.    Blood pressure 128/71, pulse 80, temperature (!) 97.5 F (36.4 C), temperature source Oral, resp. rate 19, height 5' 5 (1.651 m), weight 34.5 kg,  SpO2 96%. Physical Exam Constitutional:      General: She is not in acute distress.    Appearance: She is well-developed. She is not diaphoretic.  HENT:     Head: Normocephalic and atraumatic.  Eyes:     General: No scleral icterus.       Right eye: No discharge.        Left eye: No discharge.     Conjunctiva/sclera: Conjunctivae normal.  Cardiovascular:     Rate and Rhythm: Normal rate and regular rhythm.  Pulmonary:     Effort: Pulmonary effort is normal. No respiratory distress.  Musculoskeletal:     Cervical back: Normal range of motion.     Comments: RLE No traumatic wounds, ecchymosis, or rash  Mod TTP hip  No knee or ankle effusion  Knee stable to varus/ valgus and anterior/posterior stress  Sens DPN, SPN, TN intact  Motor EHL, ext, flex, evers 3/5  DP 1+, PT 1+, No significant edema  Skin:    General: Skin is warm and dry.  Neurological:     Mental Status: She is alert.  Psychiatric:        Mood and Affect: Mood normal.        Behavior: Behavior normal.     Assessment/Plan: Right hip fx -- Plan IMN today with Dr. Jerri. Please keep NPO.    Ozell DOROTHA Ned, PA-C Orthopedic Surgery [phone] 12/01/2023, 9:17 AM

## 2023-12-01 NOTE — Plan of Care (Signed)
  Problem: Skin Integrity: Goal: Risk for impaired skin integrity will decrease 12/01/2023 0546 by Rosaura Marelyn POUR, RN Outcome: Progressing 12/01/2023 0546 by Rosaura Marelyn POUR, RN Outcome: Progressing

## 2023-12-01 NOTE — Progress Notes (Signed)
 [facility] PROGRESS NOTE    Progress Note  Alexandria Wright  FMW:[id number] DOB: 19-Sep-1929 DOA: 11/30/2023 PCP: Caro Harlene POUR, NP     Brief Narrative:   Alexandria Wright is an 89 y.o. female past medical history significant for CVA tachybradycardia syndrome with pacemaker comes in for right hip deformity after fall at home x-ray showed comminuted angulated and displaced right femur fracture orthopedic surgery was consulted for surgical repair.  Assessment/Plan:   Closed displaced intertrochanteric fracture of right femur, initial encounter (HCC) After mechanical fall, placed n.p.o. orthopedic surgery was consulted. Continue analgesics and anticoagulation per orthopedic surgery. Started on a bowel regimen.  Leukocytosis: Likely reactive has remained afebrile.  History of CVA: Continue statin, holding aspirin .  Tachy-brady syndrome (HCC) In sinus rhythm.  Stage I second acute ulcer present on admission: RN Pressure Injury Documentation: Pressure Injury 06/02/23 Sacrum Stage 1 -  Intact skin with non-blanchable redness of a localized area usually over a bony prominence. (Active)  06/02/23 1544  Location: Sacrum  Location Orientation:   Staging: Stage 1 -  Intact skin with non-blanchable redness of a localized area usually over a bony prominence.  Wound Description (Comments):   DO NOT USE:  Present on Admission: Yes     DVT prophylaxis: lovenox  Family Communication:granddaughter Status is: Inpatient Remains inpatient appropriate because: Right hip fracture    Code Status:     Code Status Orders  (From admission, onward)           Start     Ordered   11/30/23 1918  Do not attempt resuscitation (DNR)- Limited -Do Not Intubate (DNI)  Continuous       Question Answer Comment  If pulseless and not breathing No CPR or chest compressions.   In Pre-Arrest Conditions (Patient Is Breathing and Has A Pulse) Do not intubate. Provide all appropriate  non-invasive medical interventions. Avoid ICU transfer unless indicated or required.   Consent: Discussion documented in EHR or advanced directives reviewed      11/30/23 1917           Code Status History     Date Active Date Inactive Code Status Order ID Comments User Context   06/01/2023 2319 06/04/2023 1728 Do not attempt resuscitation (DNR) PRE-ARREST INTERVENTIONS DESIRED [id number]  Lonzell Emeline HERO, DO ED   11/19/2020 1105 06/01/2023 1815 DNR [id number]  Caro Harlene POUR, NP Outpatient         IV Access:   Peripheral IV   Procedures and diagnostic studies:   DG HIP UNILAT WITH PELVIS 2-3 VIEWS RIGHT Result Date: 11/30/2023 CLINICAL DATA:  Fall and right hip pain. EXAM: DG HIP (WITH OR WITHOUT PELVIS) 2-3V RIGHT COMPARISON:  None Available. FINDINGS: There is a comminuted, angulated, and displaced fracture of the right femoral neck. No dislocation. The bones are osteopenic. The soft tissues are unremarkable. IMPRESSION: Comminuted, angulated, and displaced fracture of the right femoral neck. Electronically Signed   By: Vanetta Chou M.D.   On: 11/30/2023 17:05     Medical Consultants:   None.   Subjective:    Alexandria Wright she relates her pain is controlled.  Objective:    Vitals:   11/30/23 1855 11/30/23 2012 11/30/23 2018 11/30/23 2057  BP: 124/75  138/61 (!) 141/74  Pulse: 98 87 80 92  Resp: 19 (!) 22 (!) 27 14  Temp:  (!) 97.4 F (36.3 C)  97.7 F (36.5 C)  SpO2: 96% 94% 96% 94%  Weight:  Height:       SpO2: 94 %  No intake or output data in the 24 hours ending 12/01/23 0629 Filed Weights   11/30/23 1601  Weight: 34.5 kg    Exam: General exam: In no acute distress. Respiratory system: Good air movement and clear to auscultation. Cardiovascular system: S1 & S2 heard, RRR. No JVD. Gastrointestinal system: Abdomen is nondistended, soft and nontender.  Extremities: No pedal edema. Skin: No rashes, lesions or ulcers Psychiatry:  Judgement and insight appear normal. Mood & affect appropriate.    Data Reviewed:    Labs: Basic Metabolic Panel: Recent Labs  Lab 11/30/23 1651  NA 138  K 4.7  CL 98  CO2 30  GLUCOSE 131*  BUN 22  CREATININE 0.85  CALCIUM  9.2   GFR Estimated Creatinine Clearance: 23.5 mL/min (by C-G formula based on SCr of 0.85 mg/dL). Liver Function Tests: No results for input(s): AST, ALT, ALKPHOS, BILITOT, PROT, ALBUMIN in the last 168 hours. No results for input(s): LIPASE, AMYLASE in the last 168 hours. No results for input(s): AMMONIA in the last 168 hours. Coagulation profile Recent Labs  Lab 11/30/23 1651  INR 1.1   COVID-19 Labs  No results for input(s): DDIMER, FERRITIN, LDH, CRP in the last 72 hours.  No results found for: SARSCOV2NAA  CBC: Recent Labs  Lab 11/30/23 1651  WBC 26.4*  NEUTROABS 24.5*  HGB 12.2  HCT 39.4  MCV 100.5*  PLT 311   Cardiac Enzymes: No results for input(s): CKTOTAL, CKMB, CKMBINDEX, TROPONINI in the last 168 hours. BNP (last 3 results) No results for input(s): PROBNP in the last 8760 hours. CBG: No results for input(s): GLUCAP in the last 168 hours. D-Dimer: No results for input(s): DDIMER in the last 72 hours. Hgb A1c: No results for input(s): HGBA1C in the last 72 hours. Lipid Profile: No results for input(s): CHOL, HDL, LDLCALC, TRIG, CHOLHDL, LDLDIRECT in the last 72 hours. Thyroid function studies: No results for input(s): TSH, T4TOTAL, T3FREE, THYROIDAB in the last 72 hours.  Invalid input(s): FREET3 Anemia work up: No results for input(s): VITAMINB12, FOLATE, FERRITIN, TIBC, IRON, RETICCTPCT in the last 72 hours. Sepsis Labs: Recent Labs  Lab 11/30/23 1651  WBC 26.4*   Microbiology No results found for this or any previous visit (from the past 240 hours).   Medications:    rosuvastatin   10 mg Oral Daily   Continuous Infusions:     LOS: 1 day   Erle Odell Castor  [facility]  12/01/2023, 6:29 AM

## 2023-12-01 NOTE — Op Note (Signed)
 Date of Surgery: 12/01/2023  INDICATIONS: Alexandria Wright is a 89 y.o.-year-old female who sustained a right hip fracture. The risks and benefits of the procedure discussed with the patient prior to the procedure and all questions were answered; consent was obtained.  PREOPERATIVE DIAGNOSIS: right intertrochanteric hip fracture   POSTOPERATIVE DIAGNOSIS: Same   PROCEDURE: Open treatment of intertrochanteric, pertrochanteric, subtrochanteric fracture with intramedullary implant. CPT 343 002 [id number]   SURGEON: N. Ozell Wright, M.D.   ASSIST: Alexandria Wright, [place]  ANESTHESIA: general   IV FLUIDS AND URINE: See anesthesia record   ESTIMATED BLOOD LOSS: 150 c  IMPLANTS: [employer] InterTAN 10 x 36 cm, 90/85 lag screws, 1 35 mm distal interlocking screw  DRAINS: None.   COMPLICATIONS: see description of procedure.   DESCRIPTION OF PROCEDURE: The patient was brought to the operating room and placed supine on the operating table. The patient's leg had been signed prior to the procedure. The patient had the anesthesia placed by the anesthesiologist. The prep verification and incision time-outs were performed to confirm that this was the correct patient, site, side and location. The patient had an SCD on the opposite lower extremity. The patient did receive antibiotics prior to the incision and was re-dosed during the procedure as needed at indicated intervals. The patient was positioned on the fracture table with the table in traction and internal rotation to reduce the hip. The well leg was placed in a scissor position and all bony prominences were well-padded. The patient had the lower extremity prepped and draped in the standard surgical fashion. The incision was made 4 finger breadths superior to the greater trochanter. A guide pin was inserted into the tip of the greater trochanter under fluoroscopic guidance. An opening reamer was used to gain access to the femoral canal. The nail length was  measured and inserted down the femoral canal to its proper depth. The appropriate version of insertion for the lag screw was found under fluoroscopy. A pin was inserted up the femoral neck through the jig. Then, a second antirotation pin was inserted inferior to the first pin. The length of the lag screw was then measured. The lag screw was inserted as near to center-center in the head as possible. The antirotation pin was then taken out and an interdigitating compression screw was placed in its place. The leg was taken out of traction, then the interdigitating compression screw was used to compress across the fracture. Compression was visualized on serial xrays.  A distal interlocking screw was placed using the perfect circle technique.  The wound was copiously irrigated with saline and the subcutaneous layer closed with 2.0 vicryl and the skin was reapproximated with staples. The wounds were cleaned and dried a final time and a sterile dressing was placed. The hip was taken through a range of motion at the end of the case under fluoroscopic imaging to visualize the approach-withdraw phenomenon and confirm implant length in the head. The patient was then awakened from anesthesia and taken to the recovery room in stable condition. All counts were correct at the end of the case.   Alexandria Wright was necessary for opening, closing, retracting, limb positioning and overall facilitation and completion of the surgery.  POSTOPERATIVE PLAN: The patient will be weight bearing as tolerated and will return in 2 weeks for staple removal and the patient will receive DVT prophylaxis based on other medications, activity level, and risk ratio of bleeding to thrombosis.   Alexandria Ozell Cummins, MD [facility]  5:09 PM

## 2023-12-01 NOTE — TOC CM/SW Note (Signed)
 Transition of Care [facility]) - Inpatient Brief Assessment   Patient Details  Name: Alexandria Wright MRN: [id number] Date of Birth: 11/02/1929  [facility] of [facility]) CM/SW Contact:    Lauraine FORBES Saa, LCSW Phone Number: 12/01/2023, 9:11 AM   Clinical Narrative:  9:11 AM Per chart review, patient resides at home alone. Patient has a PCP and insurance. Patient does not have SNF history. Patient has HH history with [facility]. Patient has DME history (rolling walker) with [employer]. Patient's preferred pharmacy is [facility] [id number]. No TOC needs were identified at this time. TOC will continue to follow and be available to assist.  Transition of Care Asessment: Insurance and Status: Insurance coverage has been reviewed Patient has primary care physician: Yes Home environment has been reviewed: Private Residence Prior level of function:: N/A Prior/Current Home Services: No current home services (Has HH/DME history) Social Drivers of Health Review: SDOH reviewed no interventions necessary Readmission risk has been reviewed: Yes Transition of care needs: no transition of care needs at this time

## 2023-12-01 NOTE — Discharge Instructions (Signed)
    1. Change dressings as needed 2. May shower but keep incisions covered and dry 3. Take your prescribed blood thinner to prevent blood clots 4. Take stool softeners as needed 5. Take pain meds as needed  I have reviewed the patient's history and given the presence of a fragility fracture, I have deemed the necessity of a osteoporosis management referral or confirmed that the patient is currently enrolled in a osteoporosis treatment program.

## 2023-12-01 NOTE — TOC CAGE-AID Note (Signed)
 Transition of Care [facility]) - CAGE-AID Screening   Patient Details  Name: Alexandria Wright MRN: [id number] Date of Birth: October 21, 1929  Transition of Care [facility]) CM/SW Contact:    Emagene Merfeld E Druscilla Petsch, LCSW Phone Number: 12/01/2023, 12:06 PM   Clinical Narrative:    CAGE-AID Screening:    Have You Ever Felt You Ought to Cut Down on Your Drinking or Drug Use?: No Have People Annoyed You By [employer] Your Drinking Or Drug Use?: No Have You Felt Bad Or Guilty About Your Drinking Or Drug Use?: No Have You Ever Had a Drink or Used Drugs First Thing In The Morning to Steady Your Nerves or to Get Rid of a Hangover?: No CAGE-AID Score: 0  Substance Abuse Education Offered: No

## 2023-12-02 ENCOUNTER — Ambulatory Visit: Admitting: Nurse Practitioner

## 2023-12-02 ENCOUNTER — Inpatient Hospital Stay (HOSPITAL_COMMUNITY)

## 2023-12-02 ENCOUNTER — Encounter (HOSPITAL_COMMUNITY): Payer: Self-pay | Admitting: Orthopaedic Surgery

## 2023-12-02 DIAGNOSIS — S72141A Displaced intertrochanteric fracture of right femur, initial encounter for closed fracture: Secondary | ICD-10-CM | POA: Diagnosis not present

## 2023-12-02 LAB — CBC
HCT: 27.8 % — ABNORMAL LOW (ref 36.0–46.0)
HCT: 26.4 % — ABNORMAL LOW (ref 36.0–46.0)
Hemoglobin: 8.9 g/dL — ABNORMAL LOW (ref 12.0–15.0)
Hemoglobin: 8.5 g/dL — ABNORMAL LOW (ref 12.0–15.0)
MCH: 32.5 pg (ref 26.0–34.0)
MCH: 32.7 pg (ref 26.0–34.0)
MCHC: 32 g/dL (ref 30.0–36.0)
MCHC: 32.2 g/dL (ref 30.0–36.0)
MCV: 101.5 fL — ABNORMAL HIGH (ref 80.0–100.0)
MCV: 101.5 fL — ABNORMAL HIGH (ref 80.0–100.0)
Platelets: 226 K/uL (ref 150–400)
Platelets: 221 K/uL (ref 150–400)
RBC: 2.74 MIL/uL — ABNORMAL LOW (ref 3.87–5.11)
RBC: 2.6 MIL/uL — ABNORMAL LOW (ref 3.87–5.11)
RDW: 14.4 % (ref 11.5–15.5)
RDW: 14.2 % (ref 11.5–15.5)
WBC: 32.7 K/uL — ABNORMAL HIGH (ref 4.0–10.5)
WBC: 34.4 K/uL — ABNORMAL HIGH (ref 4.0–10.5)
nRBC: 0 % (ref 0.0–0.2)
nRBC: 0 % (ref 0.0–0.2)

## 2023-12-02 LAB — BASIC METABOLIC PANEL WITH GFR
Anion gap: 14 (ref 5–15)
BUN: 32 mg/dL — ABNORMAL HIGH (ref 8–23)
CO2: 24 mmol/L (ref 22–32)
Calcium: 8.4 mg/dL — ABNORMAL LOW (ref 8.9–10.3)
Chloride: 100 mmol/L (ref 98–111)
Creatinine, Ser: 1.04 mg/dL — ABNORMAL HIGH (ref 0.44–1.00)
GFR, Estimated: 51 mL/min — ABNORMAL LOW (ref >60)
Glucose, Bld: 123 mg/dL — ABNORMAL HIGH (ref 70–99)
Potassium: 4.7 mmol/L (ref 3.5–5.1)
Sodium: 138 mmol/L (ref 135–145)

## 2023-12-02 MED ORDER — FLUTICASONE FUROATE-VILANTEROL 100-25 MCG/ACT IN AEPB: 1.0000 | INHALATION_SPRAY | Freq: Every day | RESPIRATORY_TRACT | Status: DC

## 2023-12-02 MED ORDER — DOCUSATE SODIUM 50 MG/5ML PO LIQD
100.0000 mg | Freq: Two times a day (BID) | ORAL | Status: DC
  Administered 2023-12-02 – 2023-12-04 (×4): 100 mg via ORAL
  Filled 2023-12-02 (×6): qty 10

## 2023-12-02 MED ORDER — ENOXAPARIN SODIUM 30 MG/0.3ML IJ SOSY: 30.0000 mg | PREFILLED_SYRINGE | INTRAMUSCULAR | 0 refills | Status: DC

## 2023-12-02 MED ORDER — ENSURE PLUS HIGH PROTEIN PO LIQD
237.0000 mL | Freq: Three times a day (TID) | ORAL | Status: DC
  Administered 2023-12-02 – 2023-12-03 (×5): 237 mL via ORAL

## 2023-12-02 MED ORDER — FLUTICASONE FUROATE-VILANTEROL 100-25 MCG/ACT IN AEPB
1.0000 | INHALATION_SPRAY | Freq: Every day | RESPIRATORY_TRACT | Status: DC
  Administered 2023-12-02: 1 via RESPIRATORY_TRACT
  Filled 2023-12-02: qty 28

## 2023-12-02 MED ORDER — HYDROCODONE-ACETAMINOPHEN 5-325 MG PO TABS: 1.0000 | ORAL_TABLET | Freq: Three times a day (TID) | ORAL | 0 refills | Status: DC | PRN

## 2023-12-02 MED ORDER — ADULT MULTIVITAMIN W/MINERALS CH
1.0000 | ORAL_TABLET | Freq: Every day | ORAL | Status: DC
  Administered 2023-12-02 – 2023-12-03 (×2): 1 via ORAL
  Filled 2023-12-02 (×3): qty 1

## 2023-12-02 MED ORDER — SODIUM CHLORIDE 0.9 % IV SOLN: INTRAVENOUS | Status: DC

## 2023-12-02 MED ORDER — GUAIFENESIN 100 MG/5ML PO LIQD
15.0000 mL | Freq: Four times a day (QID) | ORAL | Status: DC
  Administered 2023-12-02 – 2023-12-04 (×8): 15 mL via ORAL
  Filled 2023-12-02 (×10): qty 20

## 2023-12-02 MED ORDER — ACETAMINOPHEN 325 MG PO TABS
650.0000 mg | ORAL_TABLET | Freq: Four times a day (QID) | ORAL | Status: DC | PRN
  Filled 2023-12-02: qty 2

## 2023-12-02 MED ORDER — DEXTROMETHORPHAN POLISTIREX ER 30 MG/5ML PO SUER: 15.0000 mg | Freq: Two times a day (BID) | ORAL | Status: DC | PRN

## 2023-12-02 NOTE — Plan of Care (Signed)
  Problem: Education: Goal: Knowledge of General Education information will improve Description: Including pain rating scale, medication(s)/side effects and non-pharmacologic comfort measures Outcome: Progressing   Problem: Education: Goal: Knowledge of General Education information will improve Description: Including pain rating scale, medication(s)/side effects and non-pharmacologic comfort measures Outcome: Progressing   Problem: Activity: Goal: Risk for activity intolerance will decrease Outcome: Progressing   Problem: Pain Managment: Goal: General experience of comfort will improve and/or be controlled Outcome: Progressing

## 2023-12-02 NOTE — Plan of Care (Signed)
 Pt had 2 small incontinent episodes overnight. Bladder scan done X2, last one showing without any urge to urinate. Provider placed in and out if bladder scan result looks off. In and out cath done this morning with of amber color clear urine.    Problem: Education: Goal: Knowledge of General Education information will improve Description: Including pain rating scale, medication(s)/side effects and non-pharmacologic comfort measures Outcome: Progressing   Problem: Clinical Measurements: Goal: Ability to maintain clinical measurements within normal limits will improve Outcome: Progressing Goal: Will remain free from infection Outcome: Progressing   Problem: Activity: Goal: Risk for activity intolerance will decrease Outcome: Progressing   Problem: Nutrition: Goal: Adequate nutrition will be maintained Outcome: Progressing   Problem: Pain Managment: Goal: General experience of comfort will improve and/or be controlled Outcome: Progressing   Problem: Safety: Goal: Ability to remain free from injury will improve Outcome: Progressing   Problem: Skin Integrity: Goal: Risk for impaired skin integrity will decrease Outcome: Progressing

## 2023-12-02 NOTE — Evaluation (Signed)
 Occupational Therapy Evaluation Patient Details Name: Alexandria Wright MRN: [id number] DOB: 1929-11-06 Today's Date: 12/02/2023   History of Present Illness   Alexandria Wright is a 89 y.o. female admitted 11/30/23 after ground level fall at home in driveway. Imaging demonstrated right intertrochanteric hip fracture. Pt s/p intramedullary implant 7/22. PMHx: CVA, tachy-brady syndrome s/p PPM, and GERD.     Clinical Impressions PTA pt lived independently at home alone with intermittent S from family. Pt currently requires mod A with mobility and mod A with ADL tasks @ RW level. Able to ambulate @ 12 ft on 2L with min A - R knee buckling noted as pt fatigued, requiring up to Mod A to prevent fall. . Pedi probe place on R ear -when reading SpO2 93 on 2L with activity. Pt significantly fatigued after ambulating. Patient will benefit from continued inpatient follow up therapy, <3 hours/day to maximize functional level of independence to facilitate safe DC home. Acute OT to follow.      If plan is discharge home, recommend the following:   A little help with walking and/or transfers;A lot of help with bathing/dressing/bathroom;Assistance with cooking/housework;Direct supervision/assist for medications management;Direct supervision/assist for financial management;Assist for transportation;Help with stairs or ramp for entrance     Functional Status Assessment   Patient has had a recent decline in their functional status and demonstrates the ability to make significant improvements in function in a reasonable and predictable amount of time.     Equipment Recommendations   BSC/3in1     Recommendations for Other Services         Precautions/Restrictions   Precautions Precautions: Fall Recall of Precautions/Restrictions: Intact Restrictions Weight Bearing Restrictions Per Provider Order: Yes RLE Weight Bearing Per Provider Order: Weight bearing as tolerated     Mobility Bed  Mobility               General bed mobility comments: OOB in chair    Transfers Overall transfer level: Needs assistance Equipment used: Rolling walker (2 wheels) Transfers: Sit to/from Stand, Bed to chair/wheelchair/BSC Sit to Stand: Min assist; mod A after ambulating due to R knee buckling to prevent fall     Step pivot transfers: Min assist            Balance Overall balance assessment: Needs assistance Sitting-balance support: Bilateral upper extremity supported, Feet supported Sitting balance-Leahy Scale: Good Sitting balance - Comments: Pt sat EOB with supervision.   Standing balance support: Bilateral upper extremity supported, During functional activity, Reliant on assistive device for balance Standing balance-Leahy Scale: Poor                             ADL either performed or assessed with clinical judgement   ADL Overall ADL's : Needs assistance/impaired Eating/Feeding: Modified independent   Grooming: Set up;Sitting   Upper Body Bathing: Set up;Supervision/ safety;Sitting   Lower Body Bathing: Moderate assistance;Sit to/from stand   Upper Body Dressing : Set up;Supervision/safety;Sitting   Lower Body Dressing: Moderate assistance;Sit to/from stand; able to donn/doff sock on L   Toilet Transfer: Minimal assistance;Ambulation   Toileting- Clothing Manipulation and Hygiene: Moderate assistance;Sit to/from stand       Functional mobility during ADLs: Minimal assistance;Cueing for safety;Cueing for sequencing;Rolling walker (2 wheels)       Vision Baseline Vision/History: 1 Wears glasses       Perception         Praxis  Pertinent Vitals/Pain Pain Assessment Pain Assessment: 0-10 Pain Score: 8  Pain Location: R hip Pain Descriptors / Indicators: Discomfort, Grimacing, Guarding, Aching, Sore Pain Intervention(s): Limited activity within patient's tolerance, RN gave pain meds during session     Extremity/Trunk  Assessment Upper Extremity Assessment Upper Extremity Assessment: Generalized weakness;Right hand dominant   Lower Extremity Assessment Lower Extremity Assessment: Defer to PT evaluation RLE Deficits / Details: Pt demonstrated limited hip/knee AROM. Grossly 3-/5 strength. WFL ankle rom/strength. RLE: Unable to fully assess due to pain RLE Sensation: WNL RLE Coordination: decreased gross motor   Cervical / Trunk Assessment Cervical / Trunk Assessment: Other exceptions Cervical / Trunk Exceptions: Armed forces training and education officer Communication: Impaired Factors Affecting Communication: Hearing impaired   Cognition Arousal: Alert Behavior During Therapy: WFL for tasks assessed/performed Cognition: History of cognitive impairments (per niece has required closer monitoring lately; most likely at baseline)                               Following commands: Intact       Cueing  General Comments   Cueing Techniques: Verbal cues;Gestural cues  Educated niece on strategies to reduce hospital related delirium   Exercises Exercises: Other exercises Other Exercises Other Exercises: incentive spirometer x 5 - able to pull ; encouraged use with niece   Shoulder Instructions      Home Living Family/patient expects to be discharged to:: Private residence Living Arrangements: Alone Available Help at Discharge: Family;Available PRN/intermittently (Great niece is a 10 minute walk away. Family will call to check in everyday. There are cameras set up on the front/back door to monitor when the pt comes/goes.) Type of Home: House Home Access: Stairs to enter [employer] of Steps: 3 Entrance Stairs-Rails: Right;Left Home Layout: One level     Bathroom Shower/Tub: Chief Strategy Officer: Standard Bathroom Accessibility: No   Home Equipment: Cane - single point          Prior Functioning/Environment Prior Level of Function :  Independent/Modified Independent;Driving             Mobility Comments: Ambulates without AD. 1 fall that led to current admission. ADLs Comments: Indep with ADLs. Stands up while bathing. Mainly eats microwaveable meals and can make other light snacks. Completes laundry. Driving to beauty shop and appointments.    OT Problem List: Decreased range of motion;Decreased strength;Decreased activity tolerance;Impaired balance (sitting and/or standing);Decreased safety awareness;Decreased knowledge of use of DME or AE;Pain   OT Treatment/Interventions: Self-care/ADL training;Therapeutic exercise;DME and/or AE instruction;Therapeutic activities;Energy conservation;Patient/family education;Balance training      OT Goals(Current goals can be found in the care plan section)   Acute Rehab OT Goals Patient Stated Goal: home OT Goal Formulation: With patient/family Time For Goal Achievement: 12/16/23 Potential to Achieve Goals: Good   OT Frequency:  Min 2X/week    Co-evaluation              AM-PAC OT 6 Clicks Daily Activity     Outcome Measure Help from another person eating meals?: None Help from another person taking care of personal grooming?: A Little Help from another person toileting, which includes using toliet, bedpan, or urinal?: A Lot Help from another person bathing (including washing, rinsing, drying)?: A Lot Help from another person to put on and taking off regular upper body clothing?: A Little Help from another person to put on and taking off regular lower body clothing?: A  Lot 6 Click Score: 16   End of Session Equipment Utilized During Treatment: Gait belt;Rolling walker (2 wheels);Oxygen (2L) Nurse Communication: Mobility status  Activity Tolerance: Patient tolerated treatment well Patient left: in chair;with call bell/phone within reach;with chair alarm set;with family/visitor present  OT Visit Diagnosis: Unsteadiness on feet (R26.81);Other abnormalities of  gait and mobility (R26.89);Muscle weakness (generalized) (M62.81);History of falling (Z91.81);Pain Pain - Right/Left: Right Pain - part of body: Hip                Time: 8889-8860 OT Time Calculation (min): 29 min Charges:  OT General Charges $OT Visit: 1 Visit OT Evaluation $OT Eval Moderate Complexity: 1 Mod OT Treatments $Self Care/Home Management : 8-22 mins  Kreg Sink, OT/L   Acute OT Clinical Specialist Acute Rehabilitation Services Pager [phone] Office [phone]   [facility] 12/02/2023, 12:16 PM

## 2023-12-02 NOTE — Progress Notes (Signed)
 [facility] Progress Note Patient: Alexandria Wright FMW:[id number] DOB: 11-28-1929 DOA: 11/30/2023  DOS: the patient was seen and examined on 12/02/2023  Brief Hospital Course: BRENNA FRIESENHAHN is a 89 y.o. female with medical history significant for history of CVA, tachy-brady syndrome s/p PPM who is admitted with right hip fracture. Underwent ORIF with intramedullary implant on 7/22. Assessment and Plan: Closed displaced intertrochanteric fracture of right femur, initial encounter (HCC) After mechanical fall, placed n.p.o. orthopedic surgery was consulted. Continue analgesics and anticoagulation per orthopedic surgery. Started on a bowel regimen.   Leukocytosis: Likely reactive has remained afebrile.  Although levels are significantly elevated X-ray was performed on 7/23 which is unremarkable for now. Low threshold to initiate antibiotic.  Concern for chronic bronchitis. Family reports that the patient has chronic cough. Continue cough medication. Add inhaler. Monitor for improvement in symptoms. Continue flutter valve and incentive spirometry.  Acute blood loss anemia. Hemoglobin relatively stable consult based on hemoglobin around 14.  Current hemoglobin is around 8.5. Patient agreeable for transfusion for hemoglobin less than 8. Monitor.  Mild renal insufficiency Serum creatinine trending up.  BUN trending up. Will initiate gentle IV hydration and monitor.   History of CVA: Continue statin, holding aspirin .   Tachy-brady syndrome (HCC) In sinus rhythm.   Stage I second acute ulcer present on admission:      Subjective: Pain well-controlled.  No nausea or vomiting.  Has some chronic cough.  No fever no chills.  No chest pain.  Physical Exam: Basilar crackles.  Inspiratory squeak heard. S1-S2 present. Bowel sound present No edema.  Data Reviewed: I have Reviewed nursing notes, Vitals, and Lab results. Since last encounter, pertinent lab results CBC and BMP    . I have ordered test including CBC and BMP chest x-ray blood cultures  .  Disposition: Status is: Inpatient Remains inpatient appropriate because: Monitor for improvement in pain control  enoxaparin  (LOVENOX ) injection 30 mg Start: 12/02/23 0800 SCDs Start: 12/01/23 1820 Place TED hose Start: 12/01/23 1820 SCDs Start: 11/30/23 1918   Family Communication: Family at bedside Level of care: Med-Surg   Vitals:   12/02/23 0440 12/02/23 0736 12/02/23 0737 12/02/23 1543  BP: (!) 103/53 (!) 111/53  (!) 123/44  Pulse: 89 79  76  Resp: 16 16  16   Temp: (!) 97.3 F (36.3 C)     TempSrc: Axillary     SpO2: 98% (!) 88% 93% 94%  Weight:      Height:         Author: Yetta Blanch, MD 12/02/2023 7:08 PM  Please look on [email or web address] to find out who is on call.

## 2023-12-02 NOTE — Evaluation (Signed)
 Physical Therapy Evaluation Patient Details Name: Alexandria Wright MRN: [id number] DOB: 06-11-1929 Today's Date: 12/02/2023  History of Present Illness  Alexandria Wright is a 89 y.o. female who presented to [facility] 11/30/23 after ground level fall at home. Imaging demonstrated right intertrochanteric hip fracture. Pt s/p intramedullary implant 7/22. PMHx: CVA, tachy-brady syndrome s/p PPM, and GERD.   Clinical Impression  Pt admitted with above diagnosis. PTA, pt was independent with functional mobility, ADLs/IADLs, and driving. She lives alone in a one story house with 3 STE and railings. Pt currently with functional limitations due to Alexandria deficits listed below (see PT Problem List). She required minA for bed mobility, CGA for transfers using RW, and CGA-minA for in-room ambulation using RW. Pt demonstrated an antalgic gait pattern with limited WBing on RLE secondary pain. She displayed decreased activity tolerance and unsteadiness during OOB mobility. Pt is below her baseline function currently requiring 3L supplemental oxygen to maintain SpO2 >88% with activity. Pt will benefit from acute skilled PT to increase her independence and safety with mobility to allow discharge. Given Alexandria limited family support available, home set-up, CLOF, and high fall risk recommend post-acute rehab <3 hours/day therapy.    If plan is discharge home, recommend Alexandria following: A little help with walking and/or transfers;A little help with bathing/dressing/bathroom;Assistance with cooking/housework;Assist for transportation;Help with stairs or ramp for entrance   Can travel by private vehicle   No    Equipment Recommendations Rolling walker (2 wheels);BSC/3in1  Recommendations for Other Services       Functional Status Assessment Patient has had a recent decline in their functional status and demonstrates Alexandria ability to make significant improvements in function in a reasonable and predictable amount of time.      Precautions / Restrictions Precautions Precautions: Fall Recall of Precautions/Restrictions: Intact Restrictions Weight Bearing Restrictions Per Provider Order: Yes RLE Weight Bearing Per Provider Order: Weight bearing as tolerated      Mobility  Bed Mobility Overal bed mobility: Needs Assistance Bed Mobility: Supine to Sit     Supine to sit: HOB elevated, Min assist     General bed mobility comments: Pt sat up on L side of bed with increased time. Assist to bring BLE off EOB and elevate trunk. Pt scooted fwd with BUE support.    Transfers Overall transfer level: Needs assistance Equipment used: Rolling walker (2 wheels) Transfers: Sit to/from Stand, Bed to chair/wheelchair/BSC Sit to Stand: Contact guard assist   Step pivot transfers: Contact guard assist       General transfer comment: Pt stood from lowest bed height. Cued proper hand placement using RW. Powered up with CGA. Transferred to recliner chair on her right. Good eccentric control.    Ambulation/Gait Ambulation/Gait assistance: Contact guard assist, Min assist Gait Distance (Feet): 5 Feet Assistive device: Rolling walker (2 wheels) Gait Pattern/deviations: Step-to pattern, Decreased step length - right, Decreased step length - left, Decreased stride length, Decreased weight shift to right, Antalgic Gait velocity: decreased     General Gait Details: Pt ambulated with short slow steps. Cued increased WBing through BUE support on RW to offload LEs. Pt had difficulty advancing RLE and required minA for trunk stability/support. She was able to manuever RW well.  Stairs            Wheelchair Mobility     Tilt Bed    Modified Rankin (Stroke Patients Only)       Balance Overall balance assessment: Needs assistance Sitting-balance support: Bilateral upper extremity supported,  Feet supported Sitting balance-Leahy Scale: Good Sitting balance - Comments: Pt sat EOB with supervision.   Standing  balance support: Bilateral upper extremity supported, During functional activity, Reliant on assistive device for balance Standing balance-Leahy Scale: Poor Standing balance comment: Pt dependent on RW.                             Pertinent Vitals/Pain Pain Assessment Pain Assessment: 0-10 Pain Score: 8  Pain Location: RLE (knee>hip) Pain Descriptors / Indicators: Discomfort, Grimacing, Guarding, Aching, Sore Pain Intervention(s): Monitored during session, Limited activity within patient's tolerance, Repositioned    Home Living Family/patient expects to be discharged to:: Private residence Living Arrangements: Alone Available Help at Discharge: Family;Available PRN/intermittently (Great niece is a 10 minute walk away. Family will call to check in everyday. There are cameras set up on Alexandria front/back door to monitor when Alexandria pt comes/goes.) Type of Home: House Home Access: Stairs to enter Entrance Stairs-Rails: Doctor, general practice of Steps: 3   Home Layout: One level Home Equipment: Cane - single point      Prior Function Prior Level of Function : Independent/Modified Independent;Driving             Mobility Comments: Ambulates without AD. 1 fall that led to current admission. ADLs Comments: Indep with ADLs. Stands up while bathing. Mainly eats microwaveable meals and can make other light snacks. Completes laundry. Driving to beauty shop and appointments.     Extremity/Trunk Assessment   Upper Extremity Assessment Upper Extremity Assessment: Defer to OT evaluation    Lower Extremity Assessment Lower Extremity Assessment: RLE deficits/detail (LLE overall WFL for tasks assessed) RLE Deficits / Details: Pt demonstrated limited hip/knee AROM. Grossly 3-/5 strength. WFL ankle rom/strength. RLE: Unable to fully assess due to pain RLE Sensation: WNL RLE Coordination: decreased gross motor    Cervical / Trunk Assessment Cervical / Trunk Assessment:  Other exceptions Cervical / Trunk Exceptions: Warden/ranger Communication: Impaired Factors Affecting Communication: Hearing impaired    Cognition Arousal: Alert Behavior During Therapy: WFL for tasks assessed/performed   PT - Cognitive impairments: No apparent impairments                       PT - Cognition Comments: Pt A,Ox4 Following commands: Intact       Cueing Cueing Techniques: Verbal cues, Gestural cues     General Comments General comments (skin integrity, edema, etc.): Pt greeted on 2L O2. Attempted to wean to RA, pt desaturated to 84% SpO2 on RA. Reapplied [place], bumped up to 3L for SpO2 >88% with activity. Cued PLB technqiue throughout session. Pt with wet coughs. Great niece present and supportive throughout session.    Exercises     Assessment/Plan    PT Assessment Patient needs continued PT services  PT Problem List Decreased strength;Decreased range of motion;Decreased activity tolerance;Decreased balance;Decreased mobility;Decreased knowledge of use of DME;Cardiopulmonary status limiting activity;Pain       PT Treatment Interventions DME instruction;Gait training;Stair training;Functional mobility training;Therapeutic activities;Therapeutic exercise;Balance training;Patient/family education    PT Goals (Current goals can be found in Alexandria Care Plan section)  Acute Rehab PT Goals Patient Stated Goal: Return Home PT Goal Formulation: With patient/family Time For Goal Achievement: 12/16/23 Potential to Achieve Goals: Good    Frequency Min 3X/week     Co-evaluation               AM-PAC PT 6  Clicks Mobility  Outcome Measure Help needed turning from your back to your side while in a flat bed without using bedrails?: A Little Help needed moving from lying on your back to sitting on [place] side of a flat bed without using bedrails?: A Little Help needed moving to and from a bed to a chair (including a wheelchair)?: A  Little Help needed standing up from a chair using your arms (e.g., wheelchair or bedside chair)?: A Little Help needed to walk in hospital room?: A Little Help needed climbing 3-5 steps with a railing? : A Lot 6 Click Score: 17    End of Session Equipment Utilized During Treatment: Gait belt Activity Tolerance: Patient tolerated treatment well;Patient limited by fatigue;Patient limited by pain Patient left: in chair;with call bell/phone within reach;with chair alarm set;with family/visitor present Nurse Communication: Mobility status PT Visit Diagnosis: Difficulty in walking, not elsewhere classified (R26.2);Unsteadiness on feet (R26.81);Other abnormalities of gait and mobility (R26.89);Pain Pain - Right/Left: Right Pain - part of body: Hip;Knee    Time: 0933-1006 PT Time Calculation (min) (ACUTE ONLY): 33 min   Charges:   PT Evaluation $PT Eval Moderate Complexity: 1 Mod PT Treatments $Therapeutic Activity: 8-22 mins PT General Charges $$ ACUTE PT VISIT: 1 Visit         Randall SAUNDERS, PT, DPT Acute Rehabilitation Services Office: [phone] Secure Chat Preferred  Delon CHRISTELLA Callander 12/02/2023, 10:30 AM

## 2023-12-02 NOTE — Anesthesia Postprocedure Evaluation (Signed)
 Anesthesia Post Note  Patient: Alexandria Wright  Procedure(s) Performed: FIXATION, FRACTURE, INTERTROCHANTERIC, WITH INTRAMEDULLARY ROD (Right: Hip)     Patient location during evaluation: PACU Anesthesia Type: MAC and Spinal Level of consciousness: awake and alert and oriented Pain management: pain level controlled Vital Signs Assessment: post-procedure vital signs reviewed and stable Respiratory status: spontaneous breathing, nonlabored ventilation and respiratory function stable Cardiovascular status: blood pressure returned to baseline and stable Postop Assessment: no headache, no backache, spinal receding and no apparent nausea or vomiting Anesthetic complications: no   No notable events documented.  Last Vitals:  Vitals:   12/02/23 0440 12/02/23 0736  BP: (!) 103/53 (!) 111/53  Pulse: 89 79  Resp: 16 16  Temp: (!) 36.3 C   SpO2: 98% (!) 88%    Last Pain:  Vitals:   12/02/23 0648  TempSrc:   PainSc: Asleep                 [name]

## 2023-12-02 NOTE — Progress Notes (Signed)
 OT Cancellation Note  Patient Details Name: Alexandria Wright MRN: [id number] DOB: 06/14/1929   Cancelled Treatment:    Reason Eval/Treat Not Completed: Patient at procedure or test/ unavailable (working with PT. will return at a later time as schedule allows.)  [facility] 12/02/2023, 9:50 AM Kreg Sink, OT/L   Acute OT Clinical Specialist Acute Rehabilitation Services Pager [phone] Office [phone]

## 2023-12-02 NOTE — Progress Notes (Signed)
 Subjective: 1 Day Post-Op Procedure(s) (LRB): FIXATION, FRACTURE, INTERTROCHANTERIC, WITH INTRAMEDULLARY ROD (Right) Patient reports pain as mild.    Objective: Vital signs in last 24 hours: Temp:  [97 F (36.1 C)-97.8 F (36.6 C)] 97.3 F (36.3 C) (07/23 0440) Pulse Rate:  [67-90] 79 (07/23 0736) Resp:  [14-19] 16 (07/23 0736) BP: (89-145)/(46-74) 111/53 (07/23 0736) SpO2:  [88 %-100 %] 88 % (07/23 0736)  Intake/Output from previous day: 07/22 0701 - 07/23 0700 In: 1631.4 [P.O.:120; I.V.:1211.4; IV Piggyback:300] Out: 600 [Urine:500; Blood:100] Intake/Output this shift: No intake/output data recorded.  Recent Labs    11/30/23 1651 12/01/23 0642 12/02/23 0533  HGB 12.2 11.2* 8.9*   Recent Labs    12/01/23 0642 12/02/23 0533  WBC 16.7* 32.7*  RBC 3.47* 2.74*  HCT 35.1* 27.8*  PLT 248 226   Recent Labs    12/01/23 0625 12/02/23 0533  NA 135 138  K 5.1 4.7  CL 99 100  CO2 23 24  BUN 25* 32*  CREATININE 0.95 1.04*  GLUCOSE 107* 123*  CALCIUM  8.8* 8.4*   Recent Labs    11/30/23 1651  INR 1.1    Neurologically intact Neurovascular intact Sensation intact distally Intact pulses distally Dorsiflexion/Plantar flexion intact Incision: dressing C/D/I No cellulitis present Compartment soft   Assessment/Plan: 1 Day Post-Op Procedure(s) (LRB): FIXATION, FRACTURE, INTERTROCHANTERIC, WITH INTRAMEDULLARY ROD (Right) Weightbearing: WBAT RLE Insicional and dressing care: Dressings left intact until follow-up Orthopedic device(s): None Showering: pod #3 with bandage VTE prophylaxis: lovenox  30 x 2 weeks Pain control: norco Follow - up plan: 2 weeks Contact information:  xu MD, jule PA       Alexandria Wright 12/02/2023, 8:18 AM

## 2023-12-03 DIAGNOSIS — S72141A Displaced intertrochanteric fracture of right femur, initial encounter for closed fracture: Secondary | ICD-10-CM | POA: Diagnosis not present

## 2023-12-03 LAB — BASIC METABOLIC PANEL WITH GFR
Anion gap: 11 (ref 5–15)
BUN: 39 mg/dL — ABNORMAL HIGH (ref 8–23)
CO2: 29 mmol/L (ref 22–32)
Calcium: 8.6 mg/dL — ABNORMAL LOW (ref 8.9–10.3)
Chloride: 96 mmol/L — ABNORMAL LOW (ref 98–111)
Creatinine, Ser: 1.13 mg/dL — ABNORMAL HIGH (ref 0.44–1.00)
GFR, Estimated: 46 mL/min — ABNORMAL LOW (ref >60)
Glucose, Bld: 124 mg/dL — ABNORMAL HIGH (ref 70–99)
Potassium: 4.7 mmol/L (ref 3.5–5.1)
Sodium: 136 mmol/L (ref 135–145)

## 2023-12-03 LAB — IRON AND TIBC
Iron: 10 ug/dL — ABNORMAL LOW (ref 28–170)
Saturation Ratios: 4 % — ABNORMAL LOW (ref 10.4–31.8)
TIBC: 249 ug/dL — ABNORMAL LOW (ref 250–450)
UIBC: 239 ug/dL

## 2023-12-03 LAB — CBC
HCT: 24 % — ABNORMAL LOW (ref 36.0–46.0)
HCT: 29.1 % — ABNORMAL LOW (ref 36.0–46.0)
Hemoglobin: 7.7 g/dL — ABNORMAL LOW (ref 12.0–15.0)
Hemoglobin: 9.6 g/dL — ABNORMAL LOW (ref 12.0–15.0)
MCH: 32.2 pg (ref 26.0–34.0)
MCH: 31.9 pg (ref 26.0–34.0)
MCHC: 32.1 g/dL (ref 30.0–36.0)
MCHC: 33 g/dL (ref 30.0–36.0)
MCV: 100.4 fL — ABNORMAL HIGH (ref 80.0–100.0)
MCV: 96.7 fL (ref 80.0–100.0)
Platelets: 230 K/uL (ref 150–400)
Platelets: 241 K/uL (ref 150–400)
RBC: 2.39 MIL/uL — ABNORMAL LOW (ref 3.87–5.11)
RBC: 3.01 MIL/uL — ABNORMAL LOW (ref 3.87–5.11)
RDW: 14.2 % (ref 11.5–15.5)
RDW: 16.3 % — ABNORMAL HIGH (ref 11.5–15.5)
WBC: 30.8 K/uL — ABNORMAL HIGH (ref 4.0–10.5)
WBC: 26.8 K/uL — ABNORMAL HIGH (ref 4.0–10.5)
nRBC: 0 % (ref 0.0–0.2)
nRBC: 0 % (ref 0.0–0.2)

## 2023-12-03 LAB — PROCALCITONIN: Procalcitonin: 0.69 ng/mL

## 2023-12-03 LAB — PREPARE RBC (CROSSMATCH)

## 2023-12-03 MED ORDER — HYDROCODONE-ACETAMINOPHEN 5-325 MG PO TABS
1.0000 | ORAL_TABLET | ORAL | Status: DC | PRN
  Administered 2023-12-03 – 2023-12-04 (×3): 1 via ORAL
  Filled 2023-12-03 (×4): qty 1

## 2023-12-03 MED ORDER — ENOXAPARIN SODIUM 30 MG/0.3ML IJ SOSY: 30.0000 mg | PREFILLED_SYRINGE | INTRAMUSCULAR | 0 refills | Status: DC

## 2023-12-03 MED ORDER — AZITHROMYCIN 250 MG PO TABS
500.0000 mg | ORAL_TABLET | Freq: Every day | ORAL | Status: DC
  Administered 2023-12-03: 500 mg via ORAL
  Filled 2023-12-03 (×2): qty 2

## 2023-12-03 MED ORDER — ACETAMINOPHEN 10 MG/ML IV SOLN
1000.0000 mg | Freq: Once | INTRAVENOUS | Status: AC
  Administered 2023-12-03: 1000 mg via INTRAVENOUS
  Filled 2023-12-03: qty 100

## 2023-12-03 MED ORDER — MORPHINE SULFATE (PF) 2 MG/ML IV SOLN: 1.0000 mg | INTRAVENOUS | Status: DC | PRN

## 2023-12-03 MED ORDER — CHLORHEXIDINE GLUCONATE CLOTH 2 % EX PADS
6.0000 | MEDICATED_PAD | Freq: Every day | CUTANEOUS | Status: DC
  Administered 2023-12-03 – 2023-12-06 (×4): 6 via TOPICAL

## 2023-12-03 MED ORDER — SODIUM CHLORIDE 0.9 % IV SOLN
1.0000 g | INTRAVENOUS | Status: DC
  Administered 2023-12-03: 1 g via INTRAVENOUS
  Filled 2023-12-03: qty 10

## 2023-12-03 MED ORDER — SODIUM CHLORIDE 0.9% IV SOLUTION: Freq: Once | INTRAVENOUS | Status: AC

## 2023-12-03 NOTE — Plan of Care (Signed)

## 2023-12-03 NOTE — Progress Notes (Signed)
 [facility] Progress Note Patient: Alexandria Wright FMW:[id number] DOB: 05-08-1930 DOA: 11/30/2023  DOS: the patient was seen and examined on 12/03/2023  Brief Hospital Course: NAKETA Wright is a 89 y.o. female with medical history significant for history of CVA, tachy-brady syndrome s/p PPM who is admitted with right hip fracture. Underwent ORIF with intramedullary implant on 7/22. Assessment and Plan: Closed displaced intertrochanteric fracture of right femur, initial encounter (HCC) After mechanical fall, placed n.p.o. orthopedic surgery was consulted. Continue analgesics and anticoagulation per orthopedic surgery. Started on a bowel regimen.   Leukocytosis: Likely reactive has remained afebrile.  Although levels are significantly elevated X-ray was performed on 7/23 which is unremarkable for now. Will initiate antibiotic in the setting of ongoing confusion and delirium.  Monitor response.  Concern for chronic bronchitis. Family reports that the patient has chronic cough. Continue cough medication. Add inhaler. Monitor for improvement in symptoms. Continue flutter valve and incentive spirometry. SLP consulted.  Regular diet.  Acute blood loss anemia. Hemoglobin relatively stable consult based on hemoglobin around 14.  Patient agreeable for transfusion for hemoglobin less than 8. 1 PRBC ordered on 7/24. Monitor.  Mild renal insufficiency Serum creatinine trending up.  BUN trending up. Will initiate gentle IV hydration and monitor.   History of CVA: Continue statin, holding aspirin .   Tachy-brady syndrome (HCC) In sinus rhythm.   Stage I second acute ulcer present on admission:  Delirium. Likely postop as well as hospital induced. Confused.  Keeps on repeating that she wants to die.  Primary reason for for this is poor pain control.  Will attempt to control pain as best as we can with minimal confusion.   Subjective: No nausea no vomiting.  Constantly repeating  that she wants to die secondary to poor pain control.  No fever no chills.  Unable to specify where the pain is tells me that it hurts all over.  Unable to urinate.  Physical Exam: Upper airway and basal crackles.  Inspiratory squeak. S1-S2 present. Bowel sound present. No edema. Repeating the same sentence again and again.  Alert and awake, oriented to self and place.  Data Reviewed: I have Reviewed nursing notes, Vitals, and Lab results. Since last encounter, pertinent lab results CBC and BMP   . I have ordered test including CBC and BMP  .   Disposition: Status is: Inpatient Remains inpatient appropriate because: Monitor for improvement in mentation and respiratory status  enoxaparin  (LOVENOX ) injection 30 mg Start: 12/02/23 0800 SCDs Start: 12/01/23 1820 Place TED hose Start: 12/01/23 1820 SCDs Start: 11/30/23 1918   Family Communication: Family at bedside Level of care: Med-Surg   Vitals:   12/03/23 1302 12/03/23 1332 12/03/23 1519 12/03/23 1630  BP: (!) 141/61 (!) 131/59 (!) 129/53 129/66  Pulse: 100 100 98   Resp:  18 18 18   Temp: (!) 97.5 F (36.4 C) (!) 97.5 F (36.4 C) 97.7 F (36.5 C) 97.8 F (36.6 C)  TempSrc: Axillary Axillary Oral   SpO2:   95% 95%  Weight:      Height:         Author: Yetta Blanch, MD 12/03/2023 6:39 PM  Please look on [email or web address] to find out who is on call.

## 2023-12-03 NOTE — Evaluation (Signed)
 Clinical/Bedside Swallow Evaluation Patient Details  Name: Alexandria Wright MRN: [id number] Date of Birth: 06-Aug-1929  Today's Date: 12/03/2023 Time: SLP Start Time (ACUTE ONLY): 1609 SLP Stop Time (ACUTE ONLY): 1632 SLP Time Calculation (min) (ACUTE ONLY): 23 min  Past Medical History:  Past Medical History:  Diagnosis Date   Bradycardia    a. s/p MDT dual chamber PPM    Esophageal stricture    GERD (gastroesophageal reflux disease)    Skin cancer    Syncope    Tachycardia    asyptomatic nonsustained   Past Surgical History:  Past Surgical History:  Procedure Laterality Date   CATARACT EXTRACTION  12/10/2011   right    CATARACT EXTRACTION  12/17/2011   left   INTRAMEDULLARY (IM) NAIL INTERTROCHANTERIC Right 12/01/2023   Procedure: FIXATION, FRACTURE, INTERTROCHANTERIC, WITH INTRAMEDULLARY ROD;  Surgeon: Jerri Kay HERO, MD;  Location: [facility] OR;  Service: Orthopedics;  Laterality: Right;  FIXATION, FRACTURE, INTERTROCHANTERIC, WITH INTRAMEDULLARY ROD RIGHT HIP   PACEMAKER PLACEMENT  2008   MDT dual chamber PPM implanted by Dr Fernande for symptomatic bradycardia   PPM GENERATOR CHANGEOUT N/A 03/04/2017   Procedure: PPM GENERATOR CHANGEOUT;  Surgeon: Fernande Elspeth BROCKS, MD;  Location: [facility] INVASIVE CV LAB;  Service: Cardiovascular;  Laterality: N/A;   SKIN CANCER EXCISION     TONSILLECTOMY AND ADENOIDECTOMY     HPI:  Alexandria Wright is a 89 yo female presenting to ED 7/21 after ground level fall at home. Imaging demonstrated R intertrochanteric hip fx s/p intramedullary implant 7/22. MBS January 2025 initially recommended Dys 1 solids with honey thick liquids but she was later upgraded clinically to Dys 2 solids with nectar thick liquids with use of compensatory throat clearance. It was recommended she f/u for repeated MBS before advancing liquids and if this was completed, it is not accessible in chart. PMH includes CVA, tachy-brady syndrome s/p PPM, GERD    Assessment / Plan / Recommendation   Clinical Impression  Multiple family members present in room at the time of evaluation who confirm that pt resumed regular diet with thin liquids at time of discharge after January admission for acute CVA. Per chart review, CXR suggests chronic opacities and her family denies PNA. Pt consistently swallows multiple times for very small volumes of thin liquid and solid boluses and clears her throat frequently, which her family states is similar to baseline. Discussed repeating MBS, although pt and her family state that results are not likely to alter pt's care as she does not like thickened liquids and will not consume them once she returns home. Pt is at slightly increased risk for adverse reactions to aspiration secondary to limited mobility s/p acute hip fx but chest imaging is reassuring up to this point. Education was provided to pt and her family regarding compensatory strategies (oral care, throat clearance), and self-monitoring for risk of aspiration-related infection. They verbalize understanding and wish to defer MBS at this time to allow pt to resume regular diet with thin liquids. Discussed with MD and will sign off. SLP Visit Diagnosis: Dysphagia, unspecified (R13.10)    Aspiration Risk  Moderate aspiration risk    Diet Recommendation Regular;Thin liquid    Liquid Administration via: Cup;Straw Medication Administration: Whole meds with liquid Supervision: Patient able to self feed;Full supervision/cueing for compensatory strategies Compensations: Minimize environmental distractions;Slow rate;Small sips/bites Postural Changes: Seated upright at 90 degrees;Remain upright for at least 30 minutes after po intake    Other  Recommendations Oral Care Recommendations: Oral care  QID     Assistance Recommended at Discharge    Functional Status Assessment Patient has not had a recent decline in their functional status  Frequency and Duration            Prognosis Prognosis for improved  oropharyngeal function: Fair Barriers to Reach Goals: Time post onset      Swallow Study   General HPI: Alexandria Wright is a 89 yo female presenting to ED 7/21 after ground level fall at home. Imaging demonstrated R intertrochanteric hip fx s/p intramedullary implant 7/22. MBS January 2025 initially recommended Dys 1 solids with honey thick liquids but she was later upgraded clinically to Dys 2 solids with nectar thick liquids with use of compensatory throat clearance. It was recommended she f/u for repeated MBS before advancing liquids and if this was completed, it is not accessible in chart. PMH includes CVA, tachy-brady syndrome s/p PPM, GERD Type of Study: Bedside Swallow Evaluation Previous Swallow Assessment: see HPI Diet Prior to this Study: Dysphagia 2 (finely chopped);Mildly thick liquids (Level 2, nectar thick) Temperature Spikes Noted: No Respiratory Status: Room air History of Recent Intubation: No Behavior/Cognition: Alert;Cooperative;Pleasant mood Oral Cavity Assessment: Within Functional Limits Oral Care Completed by SLP: No Oral Cavity - Dentition: Dentures, top;Dentures, bottom Vision: Functional for self-feeding Self-Feeding Abilities: Able to feed self Patient Positioning: Upright in bed Baseline Vocal Quality: Normal Volitional Cough: Weak Volitional Swallow: Able to elicit    Oral/Motor/Sensory Function Overall Oral Motor/Sensory Function: Within functional limits   Ice Chips Ice chips: Not tested   Thin Liquid Thin Liquid: Impaired Presentation: Straw;Self Fed Pharyngeal  Phase Impairments: Multiple swallows;Throat Clearing - Immediate    Nectar Thick Nectar Thick Liquid: Not tested   Honey Thick Honey Thick Liquid: Not tested   Puree Puree: Within functional limits Presentation: Spoon   Solid     Solid: Impaired Presentation: Self Fed Pharyngeal Phase Impairments: Throat Clearing - Immediate      Damien Blumenthal, M.A., CCC-SLP Speech Language  Pathology, Acute Rehabilitation Services  Secure Chat preferred [phone]  12/03/2023,5:13 PM

## 2023-12-03 NOTE — Progress Notes (Signed)
 Subjective: 2 Days Post-Op Procedure(s) (LRB): FIXATION, FRACTURE, INTERTROCHANTERIC, WITH INTRAMEDULLARY ROD (Right) Patient is resting comfortable.    Objective: Vital signs in last 24 hours: Temp:  [97.6 F (36.4 C)-98.4 F (36.9 C)] 98.4 F (36.9 C) (07/24 0735) Pulse Rate:  [73-97] 81 (07/24 0735) Resp:  [16-18] 17 (07/24 0735) BP: (117-138)/(44-58) 117/51 (07/24 0735) SpO2:  [94 %-100 %] 100 % (07/24 0735)  Intake/Output from previous day: No intake/output data recorded. Intake/Output this shift: No intake/output data recorded.  Recent Labs    11/30/23 1651 12/01/23 0642 12/02/23 0533 12/02/23 1756 12/03/23 0550  HGB 12.2 11.2* 8.9* 8.5* 7.7*   Recent Labs    12/02/23 1756 12/03/23 0550  WBC 34.4* 30.8*  RBC 2.60* 2.39*  HCT 26.4* 24.0*  PLT 221 230   Recent Labs    12/02/23 0533 12/03/23 0550  NA 138 136  K 4.7 4.7  CL 100 96*  CO2 24 29  BUN 32* 39*  CREATININE 1.04* 1.13*  GLUCOSE 123* 124*  CALCIUM  8.4* 8.6*   Recent Labs    11/30/23 1651  INR 1.1    Neurovascular intact Incision: dressing C/D/I No cellulitis present Compartment soft   Assessment/Plan: 2 Days Post-Op Procedure(s) (LRB): FIXATION, FRACTURE, INTERTROCHANTERIC, WITH INTRAMEDULLARY ROD (Right) Weightbearing: WBAT RLE Insicional and dressing care: Dressings left intact until follow-up Orthopedic device(s): None Showering: pod #3 with bandage VTE prophylaxis: lovenox  30 x 2 weeks Pain control: norco Follow - up plan: 2 weeks Contact information:  xu MD, jule PA       Ronal LITTIE jule 12/03/2023, 8:04 AM

## 2023-12-03 NOTE — Progress Notes (Signed)
 PT Cancellation Note  Patient Details Name: Alexandria Wright MRN: [id number] DOB: 30-Sep-1929   Cancelled Treatment:    Reason Eval/Treat Not Completed: Pain limiting ability to participate;Patient declined, no reason specified (Pt declined PT treatment reporting 10/10 pain at rest. RN and MD aware. Will follow-up as schedule permits.)  Randall SAUNDERS, PT, DPT Acute Rehabilitation Services Office: [phone] Secure Chat Preferred  Alexandria Wright 12/03/2023, 2:25 PM

## 2023-12-03 NOTE — Plan of Care (Signed)
  Problem: Education: Goal: Knowledge of General Education information will improve Description: Including pain rating scale, medication(s)/side effects and non-pharmacologic comfort measures Outcome: Not Progressing   Problem: Health Behavior/Discharge Planning: Goal: Ability to manage health-related needs will improve Outcome: Not Progressing   Problem: Clinical Measurements: Goal: Ability to maintain clinical measurements within normal limits will improve Outcome: Progressing Goal: Will remain free from infection Outcome: Progressing Goal: Diagnostic test results will improve Outcome: Progressing Goal: Respiratory complications will improve Outcome: Progressing   Problem: Activity: Goal: Risk for activity intolerance will decrease Outcome: Progressing   Problem: Nutrition: Goal: Adequate nutrition will be maintained Outcome: Progressing   Problem: Coping: Goal: Level of anxiety will decrease Outcome: Progressing   Problem: Elimination: Goal: Will not experience complications related to bowel motility Outcome: Progressing Goal: Will not experience complications related to urinary retention Outcome: Progressing   Problem: Pain Managment: Goal: General experience of comfort will improve and/or be controlled Outcome: Progressing   Problem: Safety: Goal: Ability to remain free from injury will improve Outcome: Progressing

## 2023-12-04 ENCOUNTER — Inpatient Hospital Stay (HOSPITAL_COMMUNITY)

## 2023-12-04 DIAGNOSIS — J9601 Acute respiratory failure with hypoxia: Secondary | ICD-10-CM | POA: Diagnosis not present

## 2023-12-04 DIAGNOSIS — S72141A Displaced intertrochanteric fracture of right femur, initial encounter for closed fracture: Secondary | ICD-10-CM | POA: Diagnosis not present

## 2023-12-04 LAB — TYPE AND SCREEN
ABO/RH(D): O NEG
Antibody Screen: NEGATIVE
Unit division: 0

## 2023-12-04 LAB — CBC
HCT: 28.2 % — ABNORMAL LOW (ref 36.0–46.0)
Hemoglobin: 9.4 g/dL — ABNORMAL LOW (ref 12.0–15.0)
MCH: 32.1 pg (ref 26.0–34.0)
MCHC: 33.3 g/dL (ref 30.0–36.0)
MCV: 96.2 fL (ref 80.0–100.0)
Platelets: 247 K/uL (ref 150–400)
RBC: 2.93 MIL/uL — ABNORMAL LOW (ref 3.87–5.11)
RDW: 16.4 % — ABNORMAL HIGH (ref 11.5–15.5)
WBC: 26.3 K/uL — ABNORMAL HIGH (ref 4.0–10.5)
nRBC: 0 % (ref 0.0–0.2)

## 2023-12-04 LAB — BPAM RBC
Blood Product Expiration Date: 202507272359
ISSUE DATE / TIME: 202507241311
Unit Type and Rh: 9500

## 2023-12-04 LAB — BASIC METABOLIC PANEL WITH GFR
Anion gap: 16 — ABNORMAL HIGH (ref 5–15)
BUN: 38 mg/dL — ABNORMAL HIGH (ref 8–23)
CO2: 24 mmol/L (ref 22–32)
Calcium: 8.7 mg/dL — ABNORMAL LOW (ref 8.9–10.3)
Chloride: 97 mmol/L — ABNORMAL LOW (ref 98–111)
Creatinine, Ser: 0.91 mg/dL (ref 0.44–1.00)
GFR, Estimated: 60 mL/min — ABNORMAL LOW (ref >60)
Glucose, Bld: 83 mg/dL (ref 70–99)
Potassium: 4.7 mmol/L (ref 3.5–5.1)
Sodium: 137 mmol/L (ref 135–145)

## 2023-12-04 LAB — BLOOD GAS, VENOUS
Acid-Base Excess: 4 mmol/L — ABNORMAL HIGH (ref 0.0–2.0)
Bicarbonate: 30.6 mmol/L — ABNORMAL HIGH (ref 20.0–28.0)
Drawn by: 64724
O2 Saturation: 69.1 %
Patient temperature: 36.6
pCO2, Ven: 52 mmHg (ref 44–60)
pH, Ven: 7.38 (ref 7.25–7.43)
pO2, Ven: 37 mmHg (ref 32–45)

## 2023-12-04 LAB — BRAIN NATRIURETIC PEPTIDE: B Natriuretic Peptide: 203.7 pg/mL — ABNORMAL HIGH (ref 0.0–100.0)

## 2023-12-04 LAB — PROCALCITONIN: Procalcitonin: 1.12 ng/mL

## 2023-12-04 LAB — D-DIMER, QUANTITATIVE: D-Dimer, Quant: 2.28 ug{FEU}/mL — ABNORMAL HIGH (ref 0.00–0.50)

## 2023-12-04 LAB — TROPONIN I (HIGH SENSITIVITY): Troponin I (High Sensitivity): 18 ng/L — ABNORMAL HIGH (ref <18)

## 2023-12-04 MED ORDER — IPRATROPIUM-ALBUTEROL 0.5-2.5 (3) MG/3ML IN SOLN: 3.0000 mL | RESPIRATORY_TRACT | Status: DC | PRN

## 2023-12-04 MED ORDER — LIDOCAINE 5 % EX PTCH
1.0000 | MEDICATED_PATCH | CUTANEOUS | Status: DC
  Administered 2023-12-04: 1 via TRANSDERMAL
  Filled 2023-12-04: qty 1

## 2023-12-04 MED ORDER — SODIUM CHLORIDE 0.9 % IV SOLN
1.0000 g | INTRAVENOUS | Status: DC
  Administered 2023-12-04: 1 g via INTRAVENOUS
  Filled 2023-12-04: qty 10

## 2023-12-04 MED ORDER — ACETAMINOPHEN 650 MG RE SUPP: 650.0000 mg | RECTAL | Status: DC | PRN

## 2023-12-04 MED ORDER — ACETAMINOPHEN 325 MG PO TABS: 650.0000 mg | ORAL_TABLET | Freq: Four times a day (QID) | ORAL | Status: DC | PRN

## 2023-12-04 MED ORDER — PANTOPRAZOLE SODIUM 40 MG IV SOLR
40.0000 mg | Freq: Once | INTRAVENOUS | Status: AC
  Administered 2023-12-04: 40 mg via INTRAVENOUS
  Filled 2023-12-04: qty 10

## 2023-12-04 MED ORDER — METRONIDAZOLE 500 MG/100ML IV SOLN
500.0000 mg | Freq: Two times a day (BID) | INTRAVENOUS | Status: DC
  Administered 2023-12-05 – 2023-12-06 (×3): 500 mg via INTRAVENOUS
  Filled 2023-12-04 (×3): qty 100

## 2023-12-04 MED ORDER — SENNOSIDES-DOCUSATE SODIUM 8.6-50 MG PO TABS
2.0000 | ORAL_TABLET | Freq: Two times a day (BID) | ORAL | Status: DC
  Administered 2023-12-04: 2 via ORAL
  Filled 2023-12-04 (×2): qty 2

## 2023-12-04 MED ORDER — OXYCODONE HCL 5 MG PO TABS
2.5000 mg | ORAL_TABLET | Freq: Four times a day (QID) | ORAL | Status: DC | PRN
  Administered 2023-12-05: 2.5 mg via ORAL
  Filled 2023-12-04 (×2): qty 1

## 2023-12-04 MED ORDER — METRONIDAZOLE 500 MG/100ML IV SOLN
500.0000 mg | Freq: Two times a day (BID) | INTRAVENOUS | Status: DC
  Administered 2023-12-04: 500 mg via INTRAVENOUS
  Filled 2023-12-04: qty 100

## 2023-12-04 MED ORDER — CHLORHEXIDINE GLUCONATE CLOTH 2 % EX PADS: 6.0000 | MEDICATED_PAD | Freq: Every day | CUTANEOUS | Status: DC

## 2023-12-04 MED ORDER — SODIUM CHLORIDE 0.9 % IV SOLN
1.0000 g | INTRAVENOUS | Status: DC
  Administered 2023-12-05 – 2023-12-06 (×2): 1 g via INTRAVENOUS
  Filled 2023-12-04 (×2): qty 10

## 2023-12-04 MED ORDER — MORPHINE SULFATE (PF) 2 MG/ML IV SOLN
0.5000 mg | INTRAVENOUS | Status: DC | PRN
  Administered 2023-12-04 – 2023-12-05 (×4): 0.5 mg via INTRAVENOUS
  Filled 2023-12-04 (×4): qty 1

## 2023-12-04 MED ORDER — ACETAMINOPHEN 325 MG PO TABS
650.0000 mg | ORAL_TABLET | Freq: Four times a day (QID) | ORAL | Status: DC | PRN
  Administered 2023-12-04: 650 mg via ORAL
  Filled 2023-12-04: qty 2

## 2023-12-04 MED ORDER — ORAL CARE MOUTH RINSE
15.0000 mL | OROMUCOSAL | Status: DC
  Administered 2023-12-04 – 2023-12-06 (×8): 15 mL via OROMUCOSAL

## 2023-12-04 MED ORDER — ACETAMINOPHEN 10 MG/ML IV SOLN
1000.0000 mg | Freq: Four times a day (QID) | INTRAVENOUS | Status: DC
  Filled 2023-12-04: qty 100

## 2023-12-04 MED ORDER — ACETAMINOPHEN 500 MG PO TABS: 1000.0000 mg | ORAL_TABLET | Freq: Three times a day (TID) | ORAL | Status: DC

## 2023-12-04 MED ORDER — FERROUS SULFATE 325 (65 FE) MG PO TABS
325.0000 mg | ORAL_TABLET | Freq: Every day | ORAL | Status: DC
  Filled 2023-12-04: qty 1

## 2023-12-04 MED ORDER — ORAL CARE MOUTH RINSE: 15.0000 mL | OROMUCOSAL | Status: DC | PRN

## 2023-12-04 MED ORDER — BUDESONIDE 0.25 MG/2ML IN SUSP
0.2500 mg | Freq: Two times a day (BID) | RESPIRATORY_TRACT | Status: DC
  Administered 2023-12-04 – 2023-12-06 (×3): 0.25 mg via RESPIRATORY_TRACT
  Filled 2023-12-04 (×5): qty 2

## 2023-12-04 MED ORDER — ACETAMINOPHEN 500 MG PO TABS
1000.0000 mg | ORAL_TABLET | Freq: Four times a day (QID) | ORAL | Status: DC
  Administered 2023-12-04 (×2): 1000 mg via ORAL
  Filled 2023-12-04 (×2): qty 2

## 2023-12-04 MED ORDER — IOHEXOL 350 MG/ML SOLN
75.0000 mL | Freq: Once | INTRAVENOUS | Status: AC | PRN
  Administered 2023-12-04: 75 mL via INTRAVENOUS

## 2023-12-04 MED ORDER — ACETAMINOPHEN 500 MG PO TABS: 1000.0000 mg | ORAL_TABLET | Freq: Four times a day (QID) | ORAL | Status: DC

## 2023-12-04 NOTE — Consult Note (Signed)
 NAME:  Alexandria Wright, MRN:  [id number], DOB:  02-17-1930, LOS: 4 ADMISSION DATE:  11/30/2023, CONSULTATION DATE:  12/04/23 REFERRING MD:  Tobie , CHIEF COMPLAINT:  hypoxia / abnormal CT    History of Present Illness:  89 yo F PMH stroke, hx esophageal strictures + dysphagia / esophageal dysmotility noted on MBS 05/2023 who was admitted 7/21 after fall. Found to have  R hip fx.  OR w ortho 7/22 for fixation + intramedullary rod implant.    Post op course c/b progressive hypoxia. Started on abx. Ultimately went for CTA PE study which did not reveal PE but did show GGOs   PCCM consulted for recs in this setting. Evaluated patient. Her niece is at bedside. States she has had functional decline since stroke in January 2025. Previously walking multiple miles a day, mentating well. She has had progressive dysphagia and now with food aversion. She has lost significant weight over this time and is now only 32kg.   Pertinent  Medical History  Dysphagia CVA  Tachybrady  s/p PPM  Significant Hospital Events: Including procedures, antibiotic start and stop dates in addition to other pertinent events     Interim History / Subjective:  CTA chest completed   Objective    Blood pressure 131/66, pulse 100, temperature 98.9 F (37.2 C), temperature source Axillary, resp. rate 14, height 5' 5 (1.651 m), weight 32.8 kg, SpO2 97%.    FiO2 (%):  [50 %] 50 %   Intake/Output Summary (Last 24 hours) at 12/04/2023 1742 Last data filed at 12/04/2023 1730 Gross per 24 hour  Intake 363.31 ml  Output 700 ml  Net -336.69 ml   Filed Weights   11/30/23 1601 12/04/23 1233  Weight: 34.5 kg 32.8 kg    Examination: General: elderly, cachectic female laying in bed with venturi mask HENT: ncat, mm dry, temporal wasting, venturi mask Lungs: diminished bilateral bases with coarse crackles  Cardiovascular: s1s2, no murmur, no edema Abdomen: flat Extremities: no edema, generalized muscle wasting  Neuro: nods  to reassurance, does not verbalized, lethargic  GU: defer   Resolved problem list   Assessment and Plan   Acute hypoxic resp failure GGOs-- suspect acute on chronic aspiration pneumonia  Hx esophageal dysmotility, dysphagia   DNR/DNI status  -MBS 05/2023 detailing this  -suspect she chronically aspirates  -looks like thickened liquids have been rec in the past but patient has not been agreeable to this  P - do not feel she would tolerate esophogram at this point  - no interventions at this time, will watch her overnight. If no improvement tomorrow, would be most beneficial to have a goals of care discussion  - change aspiration coverage to unasyn  - on pulmicort, add duoneb prn  - pulm hygiene, IS, flutter valve - aspiration precautions  - encourage family/patient to follow aspiration guidelines with thickened liquids  - O2 for goal >92% - venturi mask d/t mouth-breathing    Best Practice (right click and Reselect all SmartList Selections daily)   Per primary  Labs   CBC: Recent Labs  Lab 11/30/23 1651 12/01/23 0642 12/02/23 0533 12/02/23 1756 12/03/23 0550 12/03/23 1814 12/04/23 0622  WBC 26.4*   < > 32.7* 34.4* 30.8* 26.8* 26.3*  NEUTROABS 24.5*  --   --   --   --   --   --   HGB 12.2   < > 8.9* 8.5* 7.7* 9.6* 9.4*  HCT 39.4   < > 27.8* 26.4* 24.0* 29.1*  28.2*  MCV 100.5*   < > 101.5* 101.5* 100.4* 96.7 96.2  PLT 311   < > 226 221 230 241 247   < > = values in this interval not displayed.    Basic Metabolic Panel: Recent Labs  Lab 11/30/23 1651 12/01/23 0625 12/02/23 0533 12/03/23 0550 12/04/23 1106  NA 138 135 138 136 137  K 4.7 5.1 4.7 4.7 4.7  CL 98 99 100 96* 97*  CO2 30 23 24 29 24   GLUCOSE 131* 107* 123* 124* 83  BUN 22 25* 32* 39* 38*  CREATININE 0.85 0.95 1.04* 1.13* 0.91  CALCIUM  9.2 8.8* 8.4* 8.6* 8.7*   GFR: Estimated Creatinine Clearance: 20.9 mL/min (by C-G formula based on SCr of 0.91 mg/dL). Recent Labs  Lab 12/02/23 1756  12/03/23 0550 12/03/23 1814 12/04/23 0622 12/04/23 1106  PROCALCITON  --   --  0.69  --  1.12  WBC 34.4* 30.8* 26.8* 26.3*  --     Liver Function Tests: No results for input(s): AST, ALT, ALKPHOS, BILITOT, PROT, ALBUMIN in the last 168 hours. No results for input(s): LIPASE, AMYLASE in the last 168 hours. No results for input(s): AMMONIA in the last 168 hours.  ABG    Component Value Date/Time   HCO3 30.6 (H) 12/04/2023 1104   O2SAT 69.1 12/04/2023 1104     Coagulation Profile: Recent Labs  Lab 11/30/23 1651  INR 1.1    Cardiac Enzymes: No results for input(s): CKTOTAL, CKMB, CKMBINDEX, TROPONINI in the last 168 hours.  HbA1C: Hgb A1c MFr Bld  Date/Time Value Ref Range Status  06/02/2023 01:46 PM 5.3 4.8 - 5.6 % Final    Comment:    (NOTE) Pre diabetes:          5.7%-6.4%  Diabetes:              >6.4%  Glycemic control for   <7.0% adults with diabetes   11/13/2020 08:06 AM 5.2 <5.7 % of total Hgb Final    Comment:    For the purpose of screening for the presence of diabetes: . <5.7%       Consistent with the absence of diabetes 5.7-6.4%    Consistent with increased risk for diabetes             (prediabetes) > or =6.5%  Consistent with diabetes . This assay result is consistent with a decreased risk of diabetes. . Currently, no consensus exists regarding use of hemoglobin A1c for diagnosis of diabetes in children. . According to American Diabetes Association (ADA) guidelines, hemoglobin A1c <7.0% represents optimal control in non-pregnant diabetic patients. Different metrics may apply to specific patient populations.  Standards of Medical Care in Diabetes(ADA). .     CBG: No results for input(s): GLUCAP in the last 168 hours.  Review of Systems:   As above  Past Medical History:  She,  has a past medical history of Bradycardia, Esophageal stricture, GERD (gastroesophageal reflux disease), Skin cancer, Syncope, and  Tachycardia.   Surgical History:   Past Surgical History:  Procedure Laterality Date   CATARACT EXTRACTION  12/10/2011   right    CATARACT EXTRACTION  12/17/2011   left   INTRAMEDULLARY (IM) NAIL INTERTROCHANTERIC Right 12/01/2023   Procedure: FIXATION, FRACTURE, INTERTROCHANTERIC, WITH INTRAMEDULLARY ROD;  Surgeon: Jerri Kay HERO, MD;  Location: [facility] OR;  Service: Orthopedics;  Laterality: Right;  FIXATION, FRACTURE, INTERTROCHANTERIC, WITH INTRAMEDULLARY ROD RIGHT HIP   PACEMAKER PLACEMENT  2008   MDT dual chamber PPM  implanted by Dr Fernande for symptomatic bradycardia   PPM GENERATOR CHANGEOUT N/A 03/04/2017   Procedure: PPM GENERATOR CHANGEOUT;  Surgeon: Fernande Elspeth BROCKS, MD;  Location: [facility] INVASIVE CV LAB;  Service: Cardiovascular;  Laterality: N/A;   SKIN CANCER EXCISION     TONSILLECTOMY AND ADENOIDECTOMY       Social History:   reports that she has never smoked. She has never used smokeless tobacco. She reports that she does not drink alcohol and does not use drugs.   Family History:  Her family history includes Heart disease in her mother. There is no history of Colon cancer.   Allergies Allergies  Allergen Reactions   Sulfonamide Derivatives Other (See Comments)    Made me very sick, per the patient    Penicillins Swelling and Rash    Has patient had a PCN reaction causing immediate rash, facial/tongue/throat swelling, SOB or lightheadedness with hypotension: Yes Has patient had a PCN reaction causing severe rash involving mucus membranes or skin necrosis: Yes Has patient had a PCN reaction that required hospitalization: No Has patient had a PCN reaction occurring within the last 10 years: No If all of the above answers are NO, then may proceed with Cephalosporin use.      Home Medications  Prior to Admission medications   Medication Sig Start Date End Date Taking? Authorizing Provider  aspirin  EC 81 MG tablet Take 1 tablet (81 mg total) by mouth daily. Swallow whole.  09/28/23  Yes Caro Harlene POUR, NP  Calcium  Carbonate-Vitamin D (OSCAL 500/200 D-3 PO) Take 1 tablet by mouth daily with breakfast.   Yes [provider]  Cholecalciferol (VITAMIN D3) 50 MCG (2000 UT) TABS Take 2,000 Units by mouth daily.   Yes [provider]  Multiple Vitamins-Minerals (CENTRUM MINIS WOMEN 50+ PO) Take 1 tablet by mouth daily.   Yes [provider]  Propylene Glycol (SYSTANE COMPLETE) 0.6 % SOLN Apply 1 drop to eye daily.   Yes [provider]  rosuvastatin  (CRESTOR ) 10 MG tablet TAKE 1 TABLET BY MOUTH EVERY DAY 09/22/23  Yes Eubanks, Jessica K, NP  clopidogrel  (PLAVIX ) 75 MG tablet TAKE 1 TABLET BY MOUTH EVERY DAY Patient not taking: Reported on 11/30/2023 08/24/23   Caro Harlene POUR, NP  enoxaparin  (LOVENOX ) 30 MG/0.3ML injection Inject 0.3 mLs (30 mg total) into the skin daily for 14 doses. 12/03/23 12/17/23  Jule Ronal CROME, PA-C  HYDROcodone -acetaminophen  (NORCO/VICODIN) 5-325 MG tablet Take 1 tablet by mouth 3 (three) times daily as needed for moderate pain (pain score 4-6). 12/02/23   Jule Ronal CROME, PA-C     Critical care time: na    Tinnie Furth PA  Saw with APP. Agree with A/P Everything including exam points toward chronic aspiration syndrome for past few decades induced by kyphosis, esophageal dysmotility, esophageal strictures and more recently stroke which accelerated process.  She has been languishing since stroke 6 months ago.  Now looks to have lost at least 10% of bodyweight due to food aversion.  Here with hip fx and delirious postop now with increasing O2 needs and acute on chronic changes on CT including remarkable basilar bronchiectasis and new infiltrates.  Exam she is kyphotic, crackles bases, profound muscle wasting, nods head appropriately but intermittently, RASS -1.  Aspiration coverage is reasonable.  Her O2 needs make repeat MBSS and/or esophagram not ideal.  Also noted that was recommended for modified  consistency diet after stroke which she is and will not be compliant with.  Spoke frankly  with niece that overall we may be looking at EOL.  Will check on her tomorrow; if no improvement with abx over weekend would see if we can get her to spot where she can go home on hospice.  Rolan Sharps MD PCCM

## 2023-12-04 NOTE — Plan of Care (Signed)
  Problem: Education: Goal: Knowledge of General Education information will improve Description: Including pain rating scale, medication(s)/side effects and non-pharmacologic comfort measures Outcome: Not Progressing   Problem: Health Behavior/Discharge Planning: Goal: Ability to manage health-related needs will improve Outcome: Not Progressing   Problem: Clinical Measurements: Goal: Ability to maintain clinical measurements within normal limits will improve Outcome: Not Progressing Goal: Will remain free from infection Outcome: Not Progressing Goal: Diagnostic test results will improve Outcome: Not Progressing Goal: Respiratory complications will improve Outcome: Not Progressing Goal: Cardiovascular complication will be avoided Outcome: Not Progressing   Problem: Activity: Goal: Risk for activity intolerance will decrease Outcome: Not Progressing   Problem: Nutrition: Goal: Adequate nutrition will be maintained Outcome: Not Progressing   Problem: Coping: Goal: Level of anxiety will decrease Outcome: Not Progressing   Problem: Elimination: Goal: Will not experience complications related to bowel motility Outcome: Not Progressing Goal: Will not experience complications related to urinary retention Outcome: Not Progressing   Problem: Pain Managment: Goal: General experience of comfort will improve and/or be controlled Outcome: Not Progressing   Problem: Safety: Goal: Ability to remain free from injury will improve Outcome: Not Progressing   Problem: Skin Integrity: Goal: Risk for impaired skin integrity will decrease Outcome: Not Progressing   Problem: Education: Goal: Verbalization of understanding the information provided (i.e., activity precautions, restrictions, etc) will improve Outcome: Not Progressing Goal: Individualized Educational Video(s) Outcome: Not Progressing   Problem: Activity: Goal: Ability to ambulate and perform ADLs will improve Outcome:  Not Progressing   Problem: Clinical Measurements: Goal: Postoperative complications will be avoided or minimized Outcome: Not Progressing   Problem: Self-Concept: Goal: Ability to maintain and perform role responsibilities to the fullest extent possible will improve Outcome: Not Progressing   Problem: Pain Management: Goal: Pain level will decrease Outcome: Not Progressing

## 2023-12-04 NOTE — Progress Notes (Signed)
 PT Cancellation Note  Patient Details Name: Alexandria Wright MRN: [id number] DOB: [birth year]   Cancelled Treatment:    Reason Eval/Treat Not Completed: Fatigue/lethargy limiting ability to participate (Pt has developed increased supplemental oxygen needs. She was recently transitioned to non-rebreather. Pt unable to actively participate with PT session. She recently worked with OT and has had portable x-ray and EKG in her room. Pt is worn out.) Will follow-up as schedule permits.   Randall SAUNDERS, PT, DPT Acute Rehabilitation Services Office: [phone] Secure Chat Preferred  Delon CHRISTELLA Callander 12/04/2023, 11:04 AM

## 2023-12-04 NOTE — Progress Notes (Signed)
 [facility] Progress Note Patient: Alexandria Wright FMW:[id number] DOB: [birth year] DOA: 11/30/2023  DOS: the patient was seen and examined on 12/04/2023  Brief Hospital Course: Alexandria Wright is a 89 y.o. female with medical history significant for history of CVA, tachy-brady syndrome s/p PPM who is admitted with right hip fracture. 7/22 underwent ORIF with intramedullary implant.  7/23 significant leukocytosis, chest x-ray for shortness of breath Negative for pneumonia. 7/24 SLP evaluation, recommended regular thin liquid diet.  Hemoglobin dropped.  Below 8.  Received 1 PRBC transfusion.  Was delirious, saying I want to die. 7/25 transferred to progressive care due to worsening respiratory status.  CT PE protocol negative for PE concerning for possible pneumonia.  Procalcitonin trending up.  Now on Venturi mask.  Assessment and Plan: Closed displaced intertrochanteric fracture of right femur, initial encounter (HCC) After mechanical fall Orthopedic surgery was consulted. Underwent intramedullary implant on 7/22. Pain control has been difficult postoperatively given her difficulty swallowing, need for NRB as well as intermittent confusion. Currently on scheduled Tylenol  with improved pain control. Continue as needed oxy 2.5 and morphine  0.5 mg. On Colace.  Changed to Senokot.   Aspiration pneumonia  Chronic bronchiectasis  leukocytosis: Large hiatal hernia Esophageal stricture. Initially leukocytosis was present which was thought to be secondary to stress reaction in the setting of recent surgery and fracture. X-ray was performed on 7/23 which was negative for pneumonia. Given persistent leukocytosis and delirium patient was started on IV antibiotics.  Ceftriaxone  and Flagyl based on her penicillin allergy . Blood cultures so far negative. Pulmonary consult due to worsening respiratory status. CT PE protocol negative for PE. There is concern for fat embolism although less likely  and most likely this is pneumonia progression. SLP evaluate the patient.  Recommended regular diet on 7/24.  Will request MBS.  Acute postop blood loss anemia. Chronic iron deficiency. Expected drop in the hemoglobin postoperatively. Baseline hemoglobin around 14.  Hemoglobin was 12 on admission.  Dropped down to 7.7 on 7/24. Status post 1 PRBC transfusion. Hemoglobin remaining stable after that. Iron level 10.  Folate 17.  B12 427.  Not a good candidate for IV iron therapy in the setting of infection. Etiology of the iron deficiency is most likely from poor p.o. intake given her cachectic state.  Mild renal insufficiency Elevated anion gap Serum creatinine trended up to 1.13. Now improving again. Anion gap is 16. For now we will monitor clinically.   History of CVA: Continue home regimen.   Tachy-brady syndrome Currently with sinus tachycardia in the setting of respiratory distress.   Stage I sacral ulcer present on admission  Delirium. Likely postop as well as hospital induced. Continue pain control.  Adult failure to thrive with underweight Body mass index is 12.03 kg/m.  Severe protein calorie malnutrition. Interventions: Magic cup, Ensure Enlive (each supplement provides 350kcal and 20 grams of protein), Liberalize Diet, MVI Dietitian following. Placing the patient had a poor outcome.   Subjective: Reported severe shortness of breath.  Reported left-sided chest pain.  Currently on Ventimask with improvement in respiratory status as well as pain.  No nausea no vomiting.  Has some ongoing cough.  RN reports that the patient coughs every time she drinks liquid medications.  Family concerned with regards to no BM.  Physical Exam: Basal crackles.  Left more than right. No wheezing. S1-S2 present.  Tachycardic. Bowel sound present.  Diffusely tender. No edema. Alert awake and oriented x 2. In severe distress.  Cachectic.  Data Reviewed:  I have Reviewed nursing notes,  Vitals, and Lab results. Since last encounter, pertinent lab results CBC and BMP   . I have ordered test including CBC BMP D-dimer troponin  . I have discussed pt's care plan and test results with PCCM  . I have ordered imaging CT PE protocol  .   Disposition: Status is: Inpatient Remains inpatient appropriate because: Requiring improvement in respiratory status  enoxaparin  (LOVENOX ) injection 30 mg Start: 12/02/23 0800 SCDs Start: 12/01/23 1820 Place TED hose Start: 12/01/23 1820 SCDs Start: 11/30/23 1918   Family Communication: Multiple family numbers at bedside. Level of care: Progressive continue switch from telemetry Vitals:   12/04/23 1130 12/04/23 1138 12/04/23 1233 12/04/23 1445  BP: (!) 142/74  131/66   Pulse:   (!) 102 100  Resp: 20 19 19 14   Temp:   98.9 F (37.2 C)   TempSrc:   Axillary   SpO2: 94% 96% 100% 97%  Weight:   32.8 kg   Height:   5' 5 (1.651 m)     The patient is critically ill with multiple organ systems failure and requires high complexity decision making for assessment and support, frequent evaluation and titration of therapies. Critical Care Time devoted to patient care services described in this note is 35 minutes   Author: Yetta Blanch, MD 12/04/2023 6:10 PM  Please look on [email or web address] to find out who is on call.

## 2023-12-04 NOTE — Progress Notes (Signed)
 Occupational Therapy Treatment Patient Details Name: Alexandria Wright MRN: [id number] DOB: 02/06/1930 Today's Date: 12/04/2023   History of present illness Alexandria Wright is a 89 y.o. female admitted 11/30/23 after ground level fall at home in driveway. Imaging demonstrated right intertrochanteric hip fracture. Pt s/p intramedullary implant 7/22. PMHx: CVA, tachy-brady syndrome s/p PPM, and GERD.   OT comments  Pt lethargic however able to answer questions. SpO2 88 supine (4L); BP 139/61. Total A to mobilize to EOB, requiring mod A to maintain sitting balance. More alert in sitting however BP sitting 117/60; and returned to supine. Once returned SpO2 84 on 6L. Nsg called. MD walked into room and managed pt. Pt now on non-rebreather. Note increased coughing/secretions as compared to eval on 7/23. Plan is post acute rehab,. Acute OT to follow.       If plan is discharge home, recommend the following:   (NA)   Equipment Recommendations  BSC/3in1    Recommendations for Other Services      Precautions / Restrictions Precautions Precautions: Fall Recall of Precautions/Restrictions: Impaired Restrictions RLE Weight Bearing Per Provider Order: Weight bearing as tolerated       Mobility Bed Mobility Overal bed mobility: Needs Assistance Bed Mobility: Supine to Sit     Supine to sit: Total assist, +2 for safety/equipment          Transfers                   General transfer comment: unable to assist     Balance     Sitting balance-Leahy Scale: Poor                                     ADL either performed or assessed with clinical judgement   ADL                                         General ADL Comments: total A this date    Extremity/Trunk Assessment Upper Extremity Assessment Upper Extremity Assessment: Generalized weakness   Lower Extremity Assessment Lower Extremity Assessment: Defer to PT evaluation        Vision        Perception     Praxis     Communication Communication Communication: Impaired Factors Affecting Communication: Hearing impaired   Cognition Arousal: Lethargic Behavior During Therapy: Flat affect Cognition: Cognition impaired     Awareness: Intellectual awareness impaired, Online awareness impaired Memory impairment (select all impairments): Short-term memory, Working [employer] fast streamer, Engineer, structural memory Attention impairment (select first level of impairment): Focused attention Executive functioning impairment (select all impairments): Organization, Sequencing, Reasoning, Problem solving                   Following commands: Impaired Following commands impaired: Follows one step commands inconsistently      Cueing      Exercises      Shoulder Instructions       General Comments cachetic    Pertinent Vitals/ Pain       Pain Assessment Pain Assessment: Faces Faces Pain Scale: Hurts little more Pain Location: R hip Pain Descriptors / Indicators: Discomfort, Grimacing, Guarding, Aching, Sore Pain Intervention(s): Limited activity within patient's tolerance  Home Living  Prior Functioning/Environment              Frequency  Min 2X/week        Progress Toward Goals  OT Goals(current goals can now be found in the care plan section)  Progress towards OT goals: Not progressing toward goals - comment;OT to reassess next treatment (medical decline)  Acute Rehab OT Goals Patient Stated Goal: I want to die Time For Goal Achievement: 12/16/23 Potential to Achieve Goals: Fair  Plan      Co-evaluation                 AM-PAC OT 6 Clicks Daily Activity     Outcome Measure   Help from another person eating meals?: Total Help from another person taking care of personal grooming?: Total Help from another person toileting, which includes using toliet, bedpan, or urinal?:  Total Help from another person bathing (including washing, rinsing, drying)?: Total Help from another person to put on and taking off regular upper body clothing?: Total Help from another person to put on and taking off regular lower body clothing?: Total 6 Click Score: 6    End of Session    OT Visit Diagnosis: Unsteadiness on feet (R26.81);Other abnormalities of gait and mobility (R26.89);Muscle weakness (generalized) (M62.81);History of falling (Z91.81);Pain Pain - Right/Left: Right Pain - part of body: Hip   Activity Tolerance Treatment limited secondary to medical complications (Comment) (increased lethargy; SpO2 84 on 6L; nsg called)   Patient Left in bed;with call bell/phone within reach;with nursing/sitter in room;with family/visitor present   Nurse Communication          Time: 9055-8978 OT Time Calculation (min): 37 min  Charges: OT General Charges $OT Visit: 1 Visit OT Treatments $Self Care/Home Management : 23-37 mins  Kreg Sink, OT/L   Acute OT Clinical Specialist Acute Rehabilitation Services Pager [phone] Office [phone]   [facility] 12/04/2023, 10:30 AM

## 2023-12-04 NOTE — Plan of Care (Signed)
 RT able to wean off NRB and applied Venturi mask. RN administered IV morphine  for 10/10 rib pain. Patient appears comfortable. Oxygen saturations > 92%. Problem: Clinical Measurements: Goal: Respiratory complications will improve Outcome: Progressing

## 2023-12-05 ENCOUNTER — Inpatient Hospital Stay (HOSPITAL_COMMUNITY)

## 2023-12-05 DIAGNOSIS — J479 Bronchiectasis, uncomplicated: Secondary | ICD-10-CM

## 2023-12-05 DIAGNOSIS — S72141A Displaced intertrochanteric fracture of right femur, initial encounter for closed fracture: Secondary | ICD-10-CM | POA: Diagnosis not present

## 2023-12-05 DIAGNOSIS — J9601 Acute respiratory failure with hypoxia: Secondary | ICD-10-CM | POA: Diagnosis not present

## 2023-12-05 DIAGNOSIS — E43 Unspecified severe protein-calorie malnutrition: Secondary | ICD-10-CM | POA: Diagnosis not present

## 2023-12-05 LAB — CBC
HCT: 32.1 % — ABNORMAL LOW (ref 36.0–46.0)
Hemoglobin: 10.3 g/dL — ABNORMAL LOW (ref 12.0–15.0)
MCH: 31.7 pg (ref 26.0–34.0)
MCHC: 32.1 g/dL (ref 30.0–36.0)
MCV: 98.8 fL (ref 80.0–100.0)
Platelets: 287 K/uL (ref 150–400)
RBC: 3.25 MIL/uL — ABNORMAL LOW (ref 3.87–5.11)
RDW: 15.8 % — ABNORMAL HIGH (ref 11.5–15.5)
WBC: 24.2 K/uL — ABNORMAL HIGH (ref 4.0–10.5)
nRBC: 0 % (ref 0.0–0.2)

## 2023-12-05 LAB — MAGNESIUM: Magnesium: 2.5 mg/dL — ABNORMAL HIGH (ref 1.7–2.4)

## 2023-12-05 LAB — BASIC METABOLIC PANEL WITH GFR
Anion gap: 13 (ref 5–15)
BUN: 43 mg/dL — ABNORMAL HIGH (ref 8–23)
CO2: 23 mmol/L (ref 22–32)
Calcium: 8.4 mg/dL — ABNORMAL LOW (ref 8.9–10.3)
Chloride: 104 mmol/L (ref 98–111)
Creatinine, Ser: 0.97 mg/dL (ref 0.44–1.00)
GFR, Estimated: 55 mL/min — ABNORMAL LOW (ref >60)
Glucose, Bld: 110 mg/dL — ABNORMAL HIGH (ref 70–99)
Potassium: 4.2 mmol/L (ref 3.5–5.1)
Sodium: 140 mmol/L (ref 135–145)

## 2023-12-05 MED ORDER — KETOROLAC TROMETHAMINE 15 MG/ML IJ SOLN
7.5000 mg | Freq: Once | INTRAMUSCULAR | Status: AC
  Administered 2023-12-05: 7.5 mg via INTRAVENOUS
  Filled 2023-12-05: qty 1

## 2023-12-05 MED ORDER — KETOROLAC TROMETHAMINE 15 MG/ML IJ SOLN
7.5000 mg | Freq: Three times a day (TID) | INTRAMUSCULAR | Status: DC
  Administered 2023-12-05 – 2023-12-06 (×3): 7.5 mg via INTRAVENOUS
  Filled 2023-12-05 (×3): qty 1

## 2023-12-05 MED ORDER — LIDOCAINE 5 % EX PTCH
1.0000 | MEDICATED_PATCH | CUTANEOUS | Status: DC
  Filled 2023-12-05 (×2): qty 1

## 2023-12-05 MED ORDER — ACETAMINOPHEN 10 MG/ML IV SOLN
1000.0000 mg | Freq: Four times a day (QID) | INTRAVENOUS | Status: DC
  Administered 2023-12-05 – 2023-12-06 (×3): 1000 mg via INTRAVENOUS
  Filled 2023-12-05 (×4): qty 100

## 2023-12-05 MED ORDER — PANTOPRAZOLE SODIUM 40 MG IV SOLR
40.0000 mg | Freq: Two times a day (BID) | INTRAVENOUS | Status: DC
  Administered 2023-12-05 – 2023-12-06 (×3): 40 mg via INTRAVENOUS
  Filled 2023-12-05 (×3): qty 10

## 2023-12-05 MED ORDER — BISACODYL 10 MG RE SUPP
10.0000 mg | Freq: Once | RECTAL | Status: AC
  Administered 2023-12-05: 10 mg via RECTAL
  Filled 2023-12-05: qty 1

## 2023-12-05 MED ORDER — FOOD THICKENER (SIMPLYTHICK): 1.0000 | ORAL | Status: DC | PRN

## 2023-12-05 MED ORDER — ASPIRIN 81 MG PO CHEW
81.0000 mg | CHEWABLE_TABLET | Freq: Every day | ORAL | Status: DC
  Filled 2023-12-05 (×2): qty 1

## 2023-12-05 MED ORDER — METOPROLOL TARTRATE 5 MG/5ML IV SOLN
2.5000 mg | INTRAVENOUS | Status: DC | PRN
  Administered 2023-12-05 – 2023-12-06 (×4): 2.5 mg via INTRAVENOUS
  Filled 2023-12-05 (×4): qty 5

## 2023-12-05 MED ORDER — SMOG ENEMA
400.0000 mL | Freq: Once | RECTAL | Status: AC
  Administered 2023-12-05: 400 mL via RECTAL
  Filled 2023-12-05: qty 960

## 2023-12-05 MED ORDER — GUAIFENESIN 200 MG PO TABS
200.0000 mg | ORAL_TABLET | Freq: Four times a day (QID) | ORAL | Status: DC
  Administered 2023-12-05: 200 mg via ORAL
  Filled 2023-12-05 (×5): qty 1

## 2023-12-05 MED ORDER — NEPRO/CARBSTEADY PO LIQD
237.0000 mL | Freq: Three times a day (TID) | ORAL | Status: DC
  Administered 2023-12-05: 237 mL via ORAL

## 2023-12-05 MED ORDER — FAMOTIDINE IN NACL 20-0.9 MG/50ML-% IV SOLN
20.0000 mg | Freq: Every day | INTRAVENOUS | Status: DC
  Administered 2023-12-05 – 2023-12-06 (×2): 20 mg via INTRAVENOUS
  Filled 2023-12-05 (×2): qty 50

## 2023-12-05 NOTE — Progress Notes (Signed)
 [facility] Progress Note Patient: Alexandria Wright FMW:[id number] DOB: [birth year] DOA: 11/30/2023  DOS: the patient was seen and examined on 12/05/2023  Brief Hospital Course: MYRLE WANEK is a 89 y.o. female with medical history significant for history of CVA, tachy-brady syndrome s/p PPM who is admitted with right hip fracture. 7/22 underwent ORIF with intramedullary implant.  7/23 significant leukocytosis, chest x-ray for shortness of breath Negative for pneumonia. 7/24 SLP evaluation, recommended regular thin liquid diet.  Hemoglobin dropped.  Below 8.  Received 1 PRBC transfusion.  Was delirious, saying I want to die. 7/25 transferred to progressive care due to worsening respiratory status.  CT PE protocol negative for PE concerning for possible pneumonia.  Procalcitonin trending up.  Now on Venturi mask. 7/26 required mittens in the morning, mentation improved as the day progressed and pain got controlled.  Enema ordered.  Goals of care conversation with the family.  Assessment and Plan: Closed displaced intertrochanteric fracture of right femur, initial encounter After mechanical fall Orthopedic surgery was consulted. Underwent intramedullary implant on 7/22.  With Dr.Xu Pain control has been difficult postoperatively given her difficulty swallowing, need for oxygen as well as intermittent confusion. Currently on scheduled Tylenol  and Toradol  with improved pain control. Continue as needed oxy 2.5 and morphine  0.5 mg. Continue bowel regimen.  Enema ordered.  Continue Foley catheter given respiratory distress.   Aspiration pneumonia  Chronic bronchiectasis  leukocytosis Large hiatal hernia Esophageal stricture. Initially leukocytosis was thought to be secondary to stress reaction in the setting of recent surgery and fracture. X-ray was performed on 7/23 which was negative for frank pneumonia. Given persistent leukocytosis and delirium patient was started on IV antibiotics.   Ceftriaxone  and Flagyl  based on her penicillin allergy . Blood cultures so far negative. Pulmonary consult due to worsening respiratory status. CT PE protocol negative for PE. There is concern for fat embolism although less likely and most likely this is pneumonia progression. SLP evaluate the patient.  Recommended regular diet on 7/24.  Will request MBS.  Acute postop blood loss anemia. Chronic iron deficiency. Expected drop in the hemoglobin postoperatively. Baseline hemoglobin around 14.  Hemoglobin was 12 on admission.  Dropped down to 7.7 on 7/24. Status post 1 PRBC transfusion. Hemoglobin remaining stable after that. Iron level 10.  Folate 17.  B12 427.  Not a good candidate for IV iron therapy in the setting of infection. Etiology of the iron deficiency is most likely from poor p.o. intake given her cachectic state.  Mild renal insufficiency Elevated anion gap Serum creatinine trended up to 1.13. Now improving again. Anion gap briefly was elevated now improving as well. For now we will monitor clinically.  Especially while receiving Toradol .   History of CVA: Continue aspirin  and statin.   Sinus tachycardia S/P pacemaker implant for tachybradycardia syndrome. Currently with sinus tachycardia in the setting of respiratory distress.  Will give IV Lopressor  as needed.   Stage I sacral ulcer present on admission  Delirium. Likely postop as well as hospital induced. Continue pain control.  Delirium prevention protocol.  Adult failure to thrive with underweight Body mass index is 12.03 kg/m.  Severe protein calorie malnutrition. Interventions: Magic cup, Ensure Enlive (each supplement provides 350kcal and 20 grams of protein), Liberalize Diet, MVI Dietitian following. Placing the patient had a poor outcome.  Goals of care conversation. Patient currently requiring 8 to 9 L of oxygen to maintain saturation around 90%. Appears in respiratory distress with respiratory rate  in 20s. Tachycardic with  heart rate ranging from 120-140s Ongoing complaint of inadequate pain control. Also complains about Venturi mask which she was trying to remove requiring mittens on 7/26. Has poor p.o. intake since arrival to the hospital. Has history of aspiration but would not drink thickened water  and therefore was allowed regular diet and thin liquids. Concern for progressive pneumonia with ongoing hypoxia in the setting of aspiration pneumonia and ongoing pneumonitis. CT scan showing evidence of bronchiectasis-concern for chronic aspiration injury. Currently receiving IV ceftriaxone  and Flagyl  with improvement in leukocytosis but oxygenation still an issue. Progressively weaker and fatigue, raising concern for worsening aspiration situation in the future. Discussed in detail with regards to patient's current condition with family. Unable to fix her aspiration and therefore she will remain at risk for recurrent pneumonia and progressive decline even if she survives this hospital stay. Prognosis is poor if we may provide adequate pain control as patient constantly complains about inadequate pain control as a cause of her desire to die. Concern with worsening renal function with the use of Toradol  in the setting of poor p.o. intake. Unable to swallow safely.   BMI 12. All these raises concerns for poor prognosis.  At the request of the family about potential timeline, I reported that she may be in weeks to months territory with regards to her prognosis. Family verbalized understanding. Patient currently has no desire to initiate feeding tube. Per family patient wants to continue to fight and with that family wants to continue to treat treatable conditions. Will monitor.  Palliative care was consulted this morning.   Subjective: Required mittens in the morning.  Currently able to come off the mittens.  Pain well-controlled.  On Venturi mask saturating 96% on 8 L.  On [place] 8 L  saturating 90%.   Physical Exam: Wheeze or crackles. No wheezing. S1-S2 present.  Tachycardic. Bowel sound present.  Nontender at the time of my evaluation. No edema.  Data Reviewed: I have Reviewed nursing notes, Vitals, and Lab results. Since last encounter, pertinent lab results CBC and BMP   . I have ordered test including CBC and BMP  . I have discussed pt's care plan and test results with pulmonary  . I have ordered imaging x-ray abdomen  .   Disposition: Status is: Inpatient Remains inpatient appropriate because: Monitor for improvement in respiratory status  enoxaparin  (LOVENOX ) injection 30 mg Start: 12/02/23 0800 SCDs Start: 12/01/23 1820 Place TED hose Start: 12/01/23 1820 SCDs Start: 11/30/23 1918   Family Communication: Discussed with multiple family members. Level of care: Progressive   Vitals:   12/05/23 1017 12/05/23 1105 12/05/23 1325 12/05/23 1345  BP: (!) 151/77  (!) 147/76   Pulse: (!) 115 (!) 108  94  Resp: 18 18 20 18   Temp: 98.5 F (36.9 C)  98.1 F (36.7 C)   TempSrc: Axillary  Axillary   SpO2: 92% 92% 92% 93%  Weight:      Height:         Author: Yetta Blanch, MD 12/05/2023 4:22 PM  Please look on [email or web address] to find out who is on call.

## 2023-12-05 NOTE — Progress Notes (Signed)
   NAME:  Alexandria Wright, MRN:  [id number], DOB:  1929-06-03, LOS: 5 ADMISSION DATE:  11/30/2023, CONSULTATION DATE:  12/04/23 REFERRING MD:  Tobie , CHIEF COMPLAINT:  hypoxia / abnormal CT    History of Present Illness:  89 yo F PMH stroke, hx esophageal strictures + dysphagia / esophageal dysmotility noted on MBS 05/2023 who was admitted 7/21 after fall. Found to have  R hip fx.  OR w ortho 7/22 for fixation + intramedullary rod implant.    Post op course c/b progressive hypoxia. Started on abx. Ultimately went for CTA PE study which did not reveal PE but did show GGOs   PCCM consulted for recs in this setting. Evaluated patient. Her niece is at bedside. States she has had functional decline since stroke in January 2025. Previously walking multiple miles a day, mentating well. She has had progressive dysphagia and now with food aversion. She has lost significant weight over this time and is now only 32kg.   Pertinent  Medical History  Dysphagia CVA  Tachybrady  s/p PPM  Significant Hospital Events: Including procedures, antibiotic start and stop dates in addition to other pertinent events     Interim History / Subjective:  More awake today. Denies pain or breathlessness. Likes sleeping flat on her back. Has a [place] attacked to venturi mask with venturia mask half off face  Objective    Blood pressure (!) 151/77, pulse (!) 108, temperature 98.5 F (36.9 C), temperature source Axillary, resp. rate 18, height 5' 5 (1.651 m), weight 34.8 kg, SpO2 92%.    FiO2 (%):  [50 %] 50 %   Intake/Output Summary (Last 24 hours) at 12/05/2023 1210 Last data filed at 12/05/2023 9381 Gross per 24 hour  Intake 123.31 ml  Output 350 ml  Net -226.69 ml   Filed Weights   11/30/23 1601 12/04/23 1233 12/05/23 0603  Weight: 34.5 kg 32.8 kg 34.8 kg    Examination: Frail elderly woman in NAD Muscle wasting noted Very weak but follows commands Ongoing mildly labored breathing pattern with accessory  muscle use No edema  WBC a little better  Resolved problem list   Assessment and Plan  Lower lobe bronchiectasis from longstanding uncontrolled reflux, hiatal hernia and more recently stroke with dysphagia Acute hypoxemic respiratory failure- secondary to likely postoperative aspiration, stable FTT, weight loss- after stroke Severe protein calorie malnutrition POA  Patient states she does not want a feeding tube. Switched her to 8LPM by [place], seems to do okay with this sats 92 Would sit her upright at all times if able otherwise risking recurrent aspiration Add PPI and H2B Abx are reasonable Again, need to consider quality vs. Quantity of life here; the respiratory and nutrition issues are not new; she may not be able to get to a place where she would like in terms of quality of life I'm okay if she wants to try a thickened boost Family going to start discussing GOC with her Will check in on her tomorrow  Rolan Sharps MD PCCM

## 2023-12-05 NOTE — Progress Notes (Signed)
 SLP Cancellation Note  Patient Details Name: Alexandria Wright MRN: [id number] DOB: Oct 01, 1929   Cancelled treatment:       Reason Eval/Treat Not Completed: Other (comment) (new orders received for MBS. SLP secure messaged Dr. Tobie who advised can hold off on this test for now. SLP will plan to f/u with MD next date.)   Norleen IVAR Blase, MA, CCC-SLP Speech Therapy

## 2023-12-05 NOTE — Progress Notes (Signed)
 RT note. RT at bedside this morning. Patient removing [place]/venti mask. Patient refusing to place oxygen back on at this time. Patient stating no more.  Patient sat 85% on rm air with no labored breathing. RN aware, RT will continue to monitor.   12/05/23 0716  Therapy Vitals  Pulse Rate (!) 108  Resp (!) 21  MEWS Score/Color  MEWS Score 2  MEWS Score Color Yellow  Respiratory Assessment  Assessment Type Assess only  Respiratory Pattern Regular;Unlabored  Chest Assessment Chest expansion symmetrical  Oxygen Therapy/Pulse Ox  O2 Device Room Air  SpO2 (!) 85 %

## 2023-12-06 ENCOUNTER — Inpatient Hospital Stay (HOSPITAL_COMMUNITY)

## 2023-12-06 ENCOUNTER — Other Ambulatory Visit: Payer: Self-pay | Admitting: Internal Medicine

## 2023-12-06 DIAGNOSIS — S72141A Displaced intertrochanteric fracture of right femur, initial encounter for closed fracture: Secondary | ICD-10-CM | POA: Diagnosis not present

## 2023-12-06 DIAGNOSIS — E43 Unspecified severe protein-calorie malnutrition: Secondary | ICD-10-CM

## 2023-12-06 DIAGNOSIS — I495 Sick sinus syndrome: Secondary | ICD-10-CM

## 2023-12-06 DIAGNOSIS — Z7189 Other specified counseling: Secondary | ICD-10-CM

## 2023-12-06 DIAGNOSIS — Z515 Encounter for palliative care: Secondary | ICD-10-CM

## 2023-12-06 LAB — BASIC METABOLIC PANEL WITH GFR
Anion gap: 12 (ref 5–15)
Anion gap: 15 (ref 5–15)
Anion gap: 15 (ref 5–15)
BUN: 75 mg/dL — ABNORMAL HIGH (ref 8–23)
BUN: 80 mg/dL — ABNORMAL HIGH (ref 8–23)
BUN: 81 mg/dL — ABNORMAL HIGH (ref 8–23)
CO2: 27 mmol/L (ref 22–32)
CO2: 22 mmol/L (ref 22–32)
CO2: 21 mmol/L — ABNORMAL LOW (ref 22–32)
Calcium: 8.8 mg/dL — ABNORMAL LOW (ref 8.9–10.3)
Calcium: 8.6 mg/dL — ABNORMAL LOW (ref 8.9–10.3)
Calcium: 8.6 mg/dL — ABNORMAL LOW (ref 8.9–10.3)
Chloride: 104 mmol/L (ref 98–111)
Chloride: 105 mmol/L (ref 98–111)
Chloride: 105 mmol/L (ref 98–111)
Creatinine, Ser: 1.62 mg/dL — ABNORMAL HIGH (ref 0.44–1.00)
Creatinine, Ser: 1.81 mg/dL — ABNORMAL HIGH (ref 0.44–1.00)
Creatinine, Ser: 1.88 mg/dL — ABNORMAL HIGH (ref 0.44–1.00)
GFR, Estimated: 30 mL/min — ABNORMAL LOW (ref >60)
GFR, Estimated: 26 mL/min — ABNORMAL LOW (ref >60)
GFR, Estimated: 25 mL/min — ABNORMAL LOW (ref >60)
Glucose, Bld: 163 mg/dL — ABNORMAL HIGH (ref 70–99)
Glucose, Bld: 159 mg/dL — ABNORMAL HIGH (ref 70–99)
Glucose, Bld: 155 mg/dL — ABNORMAL HIGH (ref 70–99)
Potassium: 5.4 mmol/L — ABNORMAL HIGH (ref 3.5–5.1)
Potassium: 5.7 mmol/L — ABNORMAL HIGH (ref 3.5–5.1)
Potassium: 5.7 mmol/L — ABNORMAL HIGH (ref 3.5–5.1)
Sodium: 143 mmol/L (ref 135–145)
Sodium: 142 mmol/L (ref 135–145)
Sodium: 141 mmol/L (ref 135–145)

## 2023-12-06 LAB — HEPATIC FUNCTION PANEL
ALT: 10 U/L (ref 0–44)
AST: 25 U/L (ref 15–41)
Albumin: 2.1 g/dL — ABNORMAL LOW (ref 3.5–5.0)
Alkaline Phosphatase: 59 U/L (ref 38–126)
Bilirubin, Direct: 0.2 mg/dL (ref 0.0–0.2)
Indirect Bilirubin: 0.6 mg/dL (ref 0.3–0.9)
Total Bilirubin: 0.8 mg/dL (ref 0.0–1.2)
Total Protein: 5.3 g/dL — ABNORMAL LOW (ref 6.5–8.1)

## 2023-12-06 LAB — CBC
HCT: 33.3 % — ABNORMAL LOW (ref 36.0–46.0)
Hemoglobin: 10.6 g/dL — ABNORMAL LOW (ref 12.0–15.0)
MCH: 31.7 pg (ref 26.0–34.0)
MCHC: 31.8 g/dL (ref 30.0–36.0)
MCV: 99.7 fL (ref 80.0–100.0)
Platelets: 302 K/uL (ref 150–400)
RBC: 3.34 MIL/uL — ABNORMAL LOW (ref 3.87–5.11)
RDW: 15.3 % (ref 11.5–15.5)
WBC: 26.6 K/uL — ABNORMAL HIGH (ref 4.0–10.5)
nRBC: 0.1 % (ref 0.0–0.2)

## 2023-12-06 LAB — GLUCOSE, CAPILLARY: Glucose-Capillary: 189 mg/dL — ABNORMAL HIGH (ref 70–99)

## 2023-12-06 LAB — MAGNESIUM: Magnesium: 3.6 mg/dL — ABNORMAL HIGH (ref 1.7–2.4)

## 2023-12-06 MED ORDER — GLYCOPYRROLATE 1 MG PO TABS: 1.0000 mg | ORAL_TABLET | ORAL | Status: DC | PRN

## 2023-12-06 MED ORDER — SODIUM CHLORIDE 0.9 % IV SOLN
0.2000 mg/h | INTRAVENOUS | Status: DC
  Administered 2023-12-06: 0.2 mg/h via INTRAVENOUS
  Filled 2023-12-06: qty 5

## 2023-12-06 MED ORDER — ACETAMINOPHEN 650 MG RE SUPP: 650.0000 mg | Freq: Four times a day (QID) | RECTAL | Status: DC | PRN

## 2023-12-06 MED ORDER — POLYVINYL ALCOHOL 1.4 % OP SOLN: 1.0000 [drp] | Freq: Four times a day (QID) | OPHTHALMIC | Status: DC | PRN

## 2023-12-06 MED ORDER — HALOPERIDOL LACTATE 2 MG/ML PO CONC: 0.5000 mg | ORAL | Status: DC | PRN

## 2023-12-06 MED ORDER — LORAZEPAM 2 MG/ML IJ SOLN: 1.0000 mg | INTRAMUSCULAR | Status: DC | PRN

## 2023-12-06 MED ORDER — HALOPERIDOL LACTATE 5 MG/ML IJ SOLN: 0.5000 mg | INTRAMUSCULAR | Status: DC | PRN

## 2023-12-06 MED ORDER — ACETAMINOPHEN 325 MG PO TABS: 650.0000 mg | ORAL_TABLET | Freq: Four times a day (QID) | ORAL | Status: DC | PRN

## 2023-12-06 MED ORDER — CALCIUM GLUCONATE-NACL 1-0.675 GM/50ML-% IV SOLN
1.0000 g | Freq: Once | INTRAVENOUS | Status: AC
  Administered 2023-12-06: 1000 mg via INTRAVENOUS
  Filled 2023-12-06: qty 50

## 2023-12-06 MED ORDER — MIDAZOLAM HCL 2 MG/2ML IJ SOLN: 1.0000 mg | INTRAMUSCULAR | Status: DC | PRN

## 2023-12-06 MED ORDER — ACETAMINOPHEN 10 MG/ML IV SOLN
1000.0000 mg | Freq: Four times a day (QID) | INTRAVENOUS | Status: DC
  Administered 2023-12-06 (×2): 1000 mg via INTRAVENOUS
  Filled 2023-12-06 (×3): qty 100

## 2023-12-06 MED ORDER — SCOPOLAMINE 1 MG/3DAYS TD PT72
1.0000 | MEDICATED_PATCH | TRANSDERMAL | Status: DC
  Administered 2023-12-06: 1.5 mg via TRANSDERMAL
  Filled 2023-12-06 (×2): qty 1

## 2023-12-06 MED ORDER — SODIUM ZIRCONIUM CYCLOSILICATE 10 G PO PACK
10.0000 g | PACK | Freq: Every day | ORAL | Status: DC
  Filled 2023-12-06: qty 1

## 2023-12-06 MED ORDER — SODIUM CHLORIDE 0.9 % IV SOLN: INTRAVENOUS | Status: DC

## 2023-12-06 MED ORDER — OXYCODONE HCL 20 MG/ML PO CONC: 2.5000 mg | ORAL | Status: DC | PRN

## 2023-12-06 MED ORDER — GLYCOPYRROLATE 0.2 MG/ML IJ SOLN: 0.2000 mg | INTRAMUSCULAR | Status: DC | PRN

## 2023-12-06 MED ORDER — LORAZEPAM 1 MG PO TABS: 1.0000 mg | ORAL_TABLET | ORAL | Status: DC | PRN

## 2023-12-06 MED ORDER — INSULIN ASPART 100 UNIT/ML IV SOLN
5.0000 [IU] | Freq: Once | INTRAVENOUS | Status: AC
  Administered 2023-12-06: 5 [IU] via INTRAVENOUS

## 2023-12-06 MED ORDER — HEPARIN SODIUM (PORCINE) 5000 UNIT/ML IJ SOLN
5000.0000 [IU] | Freq: Three times a day (TID) | INTRAMUSCULAR | Status: DC
  Administered 2023-12-06 (×2): 5000 [IU] via SUBCUTANEOUS
  Filled 2023-12-06 (×2): qty 1

## 2023-12-06 MED ORDER — HYDROMORPHONE HCL 1 MG/ML IJ SOLN: 0.2000 mg | INTRAMUSCULAR | Status: DC | PRN

## 2023-12-06 MED ORDER — ONDANSETRON 4 MG PO TBDP: 4.0000 mg | ORAL_TABLET | Freq: Four times a day (QID) | ORAL | Status: DC | PRN

## 2023-12-06 MED ORDER — OXYCODONE HCL 5 MG PO TABS: 2.5000 mg | ORAL_TABLET | Freq: Four times a day (QID) | ORAL | Status: DC | PRN

## 2023-12-06 MED ORDER — ONDANSETRON HCL 4 MG/2ML IJ SOLN: 4.0000 mg | Freq: Four times a day (QID) | INTRAMUSCULAR | Status: DC | PRN

## 2023-12-06 MED ORDER — ACETAMINOPHEN 10 MG/ML IV SOLN
1000.0000 mg | Freq: Three times a day (TID) | INTRAVENOUS | Status: DC
  Filled 2023-12-06: qty 100

## 2023-12-06 MED ORDER — HYDROMORPHONE HCL-NACL 50-0.9 MG/50ML-% IV SOLN: 0.2000 mg/h | INTRAVENOUS | Status: DC

## 2023-12-06 MED ORDER — DILTIAZEM HCL 25 MG/5ML IV SOLN
10.0000 mg | Freq: Once | INTRAVENOUS | Status: DC
  Filled 2023-12-06: qty 5

## 2023-12-06 MED ORDER — HALOPERIDOL 0.5 MG PO TABS: 0.5000 mg | ORAL_TABLET | ORAL | Status: DC | PRN

## 2023-12-06 MED ORDER — DEXTROSE 50 % IV SOLN
1.0000 | Freq: Once | INTRAVENOUS | Status: AC
  Administered 2023-12-06: 50 mL via INTRAVENOUS
  Filled 2023-12-06: qty 50

## 2023-12-06 MED ORDER — HYDROMORPHONE HCL 1 MG/ML IJ SOLN: 0.5000 mg | INTRAMUSCULAR | Status: DC | PRN

## 2023-12-06 MED ORDER — PANTOPRAZOLE SODIUM 40 MG IV SOLR: 40.0000 mg | INTRAVENOUS | Status: DC

## 2023-12-06 MED ORDER — BIOTENE DRY MOUTH MT LIQD: 15.0000 mL | OROMUCOSAL | Status: DC | PRN

## 2023-12-06 MED ORDER — HYDROMORPHONE HCL-NACL 50-0.9 MG/50ML-% IV SOLN
0.2000 mg/h | INTRAVENOUS | Status: DC
  Filled 2023-12-06: qty 50

## 2023-12-06 MED ORDER — LORAZEPAM 2 MG/ML PO CONC: 1.0000 mg | ORAL | Status: DC | PRN

## 2023-12-06 NOTE — Progress Notes (Addendum)
 Pt's hr sustained in the 150s, vitals stable. PRN metop given hr now in the 130s-140s, provider on call notified . Provider came to the bedside, see new orders.   12/06/23 0100  Vitals  Pulse Rate (!) 140  ECG Heart Rate (!) 139  Resp 20  MEWS COLOR  MEWS Score Color Yellow  Oxygen Therapy  SpO2 94 %  MEWS Score  MEWS Temp 0  MEWS Systolic 0  MEWS Pulse 3  MEWS RR 0  MEWS LOC 0  MEWS Score 3

## 2023-12-06 NOTE — Progress Notes (Signed)
 SLP Cancellation Note  Patient Details Name: SHRILEY JOFFE MRN: [id number] DOB: 20-Feb-1930   Cancelled treatment:       Reason Eval/Treat Not Completed: Medical issues which prohibited therapy Per RN, patient not appropriate for MBS today. SLP will follow for readiness.  Norleen IVAR Blase, MA, CCC-SLP Speech Therapy

## 2023-12-06 NOTE — Consult Note (Cosign Needed Addendum)
 Consultation Note Date: 12/06/2023   Patient Name: Alexandria Wright  DOB: [birth year]  MRN: [id number]  Age / Sex: 89 y.o., female  PCP: Caro Harlene POUR, NP Referring Physician: Tobie Yetta HERO, MD  Reason for Consultation: Establishing goals of care  HPI/Patient Profile: 89 y.o. female  with past medical history of CVA 05/2023, tachy-brady syndrome s/p PPM, esophageal strictures + dysphagia / esophageal dysmotility  admitted on 11/30/2023 with fall and right hip fracture.   Patient underwent ORIF with intramedullary implant on 7/22. She then developed leukocytosis and there was concern for swallowing safety. Unfortunately became progressively delirious and short of breath requiring 8-9L O2 with concern for aspiration pneumonia. CT showed lower lobe bronchiectasis. Concern for failure to thrive and poor long-term prognosis. Cr worsened today at 1.81 from 1.62 yesterday.   PMT has been consulted to assist with goals of care conversation.  Clinical Assessment and Goals of Care:  I have reviewed medical records including EPIC notes, labs and imaging, assessed the patient and then at the bedside with patient's nephew Ozell to discuss diagnosis prognosis, GOC, EOL wishes, disposition and options.  I also spoke with primary healthcare power of attorney Ronal (Michael's daughter) by phone.  I introduced Palliative Medicine as specialized medical care for people living with serious illness. It focuses on providing relief from the symptoms and stress of a serious illness. The goal is to improve quality of life for both the patient and the family.  We discussed a brief life review of the patient and then focused on their current illness.   I attempted to elicit values and goals of care important to the patient.    Medical History Review and Understanding:  We discussed patient's acute illness in the context of their chronic comorbidities.  Patient's family understand the  severity of patient's illness.  Social History: Patient was a Runner, broadcasting/film/video.  She lives independently.  Functional and Nutritional State: Patient was completely independent and driving before this acute decline.  Poor nutritional status due to esophageal issues at baseline.  Albumin is 2.1 today.  Advance Directives: A detailed discussion regarding advanced directives was had.  Review documentation currently on file.  Patient's primary HCPOA is Ronal then followed by Wadie Channel, and Darice.  She has indicated advanced directive/desire for natural death if unable to recover.  Code Status: Concepts specific to code status, artifical feeding and hydration, and rehospitalization were considered and discussed.   Discussion: Patient's nephew Ozell at the bedside requested to step out to discuss with me briefly, as patient is currently resting the best that she has in over 24 hours.  We discussed how things have escalated quickly and she was doing much better a few days ago, now he feels uncertain if she will even survive this hospital stay.  Family do have plans to discuss next steps and decisions based on how she does in the coming days.  They are open to PMT support as they hold these conversations.  Ozell understands that even the best case scenario, she will need more care moving forward.  He is currently giving his family a rest and staying with Feliciana for support.  No other questions or concerns at this time. Patient's HCPOA/niece Ronal was able to speak with me by phone as well.  She is open to palliative support and appreciative of the update on the above conversations.  She will obtain PMT contact information from Ozell and plans to reach out in a couple of days.  Discussed the importance of continued conversation with family and the medical providers regarding overall plan of care and treatment options, ensuring decisions are within the context of the patient's values and GOCs.   Questions  and concerns were addressed.  Hard Choices booklet left for review. The family was encouraged to call with questions or concerns.  PMT will continue to support holistically.   SUMMARY OF RECOMMENDATIONS   - Continue DNR/DNI - Continue current care plan and allow more time for outcomes - Family is open to additional goals of care conversations after giving her a couple days  - PMT to continue to follow and support   Addendum: received update from MD that patient continued to deteriorate through the day and family has decided on transition to comfort care. Returned to the bedside to provide support. Ozell requested to step out to ask his questions, including insurance coverage for comfort care and what 6N is like.   Reviewed appropriateness of utilizing opioids for air hunger and distress, recommending continuous infusion for improved comfort, but at the least to encouraged family to frequently request PRN medications for signs of increased work of breathing or pain. Nephew verbalized his understanding and appreciation.  Prognosis:  Poor long-term prognosis  Discharge Planning: To Be Determined      Primary Diagnoses: Present on Admission:  Tachy-brady syndrome (HCC)  Closed displaced intertrochanteric fracture of right femur, initial encounter [facility])    Physical Exam Vitals and nursing note reviewed.  Constitutional:      General: She is not in acute distress.    Appearance: She is ill-appearing.     Interventions: Face mask in place.  HENT:     Head: Normocephalic and atraumatic.  Cardiovascular:     Rate and Rhythm: Tachycardia present.  Pulmonary:     Effort: No respiratory distress.  Neurological:     Mental Status: She is lethargic.  Psychiatric:        Cognition and Memory: Cognition is impaired.     Vital Signs: BP 121/65   Pulse (!) 118   Temp (!) 96.9 F (36.1 C) (Axillary)   Resp 19   Ht 5' 5 (1.651 m)   Wt 34.8 kg   SpO2 91%   BMI 12.77 kg/m  Pain  Scale: Faces POSS *See Group Information*: S-Acceptable,Sleep, easy to arouse Pain Score: Asleep   SpO2: SpO2: 91 % O2 Device:SpO2: 91 % O2 Flow Rate: .O2 Flow Rate (L/min): 9 L/min     Nayib Remer P Matty Deamer, PA-C  Palliative Medicine Team Team phone # [phone]  Thank you for allowing the Palliative Medicine Team to assist in the care of this patient. Please utilize secure chat with additional questions, if there is no response within 30 minutes please call the above phone number.  Palliative Medicine Team providers are available by phone from 7am to 7pm daily and can be reached through the team cell phone.  Should this patient require assistance outside of these hours, please call the patient's attending physician.

## 2023-12-06 NOTE — Progress Notes (Signed)
 [facility] Progress Note Patient: Alexandria Wright FMW:[id number] DOB: 15-Sep-1929 DOA: 11/30/2023  DOS: the patient was seen and examined on 12/06/2023  Brief Hospital Course: Alexandria Wright is a 89 y.o. female with medical history significant for history of CVA, tachy-brady syndrome s/p PPM who is admitted with right hip fracture. 7/22 underwent ORIF with intramedullary implant.  7/23 significant leukocytosis, chest x-ray for shortness of breath Negative for pneumonia. 7/24 SLP evaluation, recommended regular thin liquid diet.  Hemoglobin dropped.  Below 8.  Received 1 PRBC transfusion.  Was delirious, saying I want to die. 7/25 transferred to progressive care due to worsening respiratory status.  CT PE protocol negative for PE concerning for possible pneumonia.  Procalcitonin trending up.  Now on Venturi mask. 7/26 required mittens in the morning, mentation improved as the day progressed and pain got controlled.  Enema ordered.  Goals of care conversation with the family. 7/17 mentation worsened since the patient received opioids for uncontrolled pain.  AKI with hyperkalemia.  Transitioned to comfort care based on the discussion with the family.  Initiated on a Dilaudid  drip at a low rate.  Assessment and Plan: Closed displaced intertrochanteric fracture of right femur, initial encounter After mechanical fall Orthopedic surgery was consulted. Underwent intramedullary implant on 7/22.  With Dr.Xu Pain control has been difficult postoperatively given her difficulty swallowing, need for oxygen as well as intermittent confusion. Now comfort care. On Dilaudid  drip.  Continue Tylenol  IV for now.   Aspiration pneumonia  Chronic bronchiectasis  leukocytosis Large hiatal hernia Esophageal stricture. Initially leukocytosis was thought to be secondary to stress reaction in the setting of recent surgery and fracture. X-ray was performed on 7/23 which was negative for frank pneumonia. Given  persistent leukocytosis and delirium patient was started on IV antibiotics.  Ceftriaxone  and Flagyl  based on her penicillin allergy . Blood cultures so far negative. Pulmonary consult due to worsening respiratory status. CT PE protocol negative for PE. There is concern for fat embolism although less likely and most likely this is pneumonia progression. SLP evaluate the patient.  Recommended regular diet on 7/24.  Now comfort care.  Acute postop blood loss anemia. Chronic iron deficiency. Expected drop in the hemoglobin postoperatively. Baseline hemoglobin around 14.  Hemoglobin was 12 on admission.  Dropped down to 7.7 on 7/24. Status post 1 PRBC transfusion. Hemoglobin remaining stable after that. Iron level 10.  Folate 17.  B12 427.  Not a good candidate for IV iron therapy in the setting of infection. Etiology of the iron deficiency is most likely from poor p.o. intake given her cachectic state.  AKI with hyperkalemia. Serum creatinine trended up to 1.13. Then improved.  On 7/27 serum creatinine trended up again.  With hyperkalemia potassium 5.7. BUN 81. Mentation worsened as well. Initiated on IV fluid earlier.  Now stopped as transitioning to now comfort care given increasing the risk of volume overload.   History of CVA: Was on aspirin  and statin. Now comfort care.   Sinus tachycardia S/P pacemaker implant for tachybradycardia syndrome. Currently with sinus tachycardia in the setting of respiratory distress.  Treated with as needed IV Lopressor . Now comfort care.  Delirium. Likely postop as well as hospital induced. Continue pain control.  Delirium prevention protocol.  Adult failure to thrive with underweight Body mass index is 12.03 kg/m.  Severe protein calorie malnutrition. Interventions: Magic cup, Ensure Enlive (each supplement provides 350kcal and 20 grams of protein), Liberalize Diet, MVI Dietitian following. Placing the patient had a poor outcome.  Now comfort  care.  Stage I sacral ulcer present on admission  Goals of care conversation. Patient with progressively worsening pneumonia secondary to aspiration.  Oxygen requirements high at 9 L. Remaining tachycardic tachypneic and now with worsening mentation, AKI with serum creatinine 1.88 and BUN 81 and potassium of 5.7. With ongoing respiratory distress. Patient with clear desire to remain DNR/DNI and avoid a feeding tube based on her goals of care documentation. Discussed with multiple family members again on 7/27. Currently transitioning to comfort care. Given her respiratory distress recommended low-dose IV Dilaudid  0.2 mg rate infusion. Family currently agrees. Continue as needed IV Dilaudid , IV Ativan , IV Haldol  and IV Robinul . Add scopolamine  patch. Unrestricted visitation. Anticipating hospital death.   Subjective: No nausea no vomiting no fever no chills.  Ongoing shortness of breath.  After receiving pain medication last night patient has been obtunded.  Unable to follow commands.  Physical Exam: In moderate distress. S1-S2 present.  Tachycardic. Bowel sound present. Bilateral basal crackles.  No wheezing heard. Pupils are equal and round and reactive to light.  Obtunded.  Unable to follow commands.  Nonverbal to today.  Spontaneously moving both upper extremities equally.  Data Reviewed: I have Reviewed nursing notes, Vitals, and Lab results. Since last encounter, pertinent lab results CBC and BMP   .  Discussed with pulmonary as well as PCCM.  Disposition: Status is: Inpatient Remains inpatient appropriate because: Comfort care.  Anticipating hospital death.  SCDs Start: 12-28-2023 1820 Place TED hose Start: 12/28/23 1820   Family Communication: Discussed with multiple family members Level of care: Med-Surg switch from progressive for comfort measures. Vitals:   12/06/23 0906 12/06/23 1012 12/06/23 1312 12/06/23 1753  BP: 113/62 121/65 (!) 113/57 (!) 93/45  Pulse: (!) 118   (!) 102 (!) 25  Resp: 18 19 17 20   Temp: (!) 96.9 F (36.1 C)  (!) 97.3 F (36.3 C) 97.6 F (36.4 C)  TempSrc: Axillary  Axillary Oral  SpO2: 91%  90% (!) 78%  Weight:      Height:         Author: Yetta Blanch, MD 12/06/2023 7:53 PM  Please look on [email or web address] to find out who is on call.

## 2023-12-06 NOTE — Progress Notes (Addendum)
 Patient was seen for SVT. BP is stable and she does not appear to be in distress. Plan to give IV diltiazem  bolus.   Addendum: HR down to 100 bpm spontaneously, diltiazem  held.

## 2023-12-06 NOTE — Progress Notes (Signed)
 error

## 2023-12-06 NOTE — Progress Notes (Signed)
 12/06/2023 Came to bedside to round but patient unfortunately deteriorating.  Agree with primary and family decision to transition to comfort and allow peaceful passing.  Rolan Sharps MD PCCM

## 2023-12-06 NOTE — Progress Notes (Signed)
 Patient expired at 2205. MD made aware.Hydromorphone  (Dilaudid ) IV 49.8 ml wasted, witness by charge nurse Alexandria Wright.

## 2023-12-06 NOTE — Plan of Care (Signed)

## 2023-12-06 NOTE — Progress Notes (Signed)
 Pt arrived to to the unit from [facility] via bed with 9L of o2 on venturi mask. Pt is comfort care her great nephew accompanied her to the unit. Will continue to monitor

## 2023-12-06 NOTE — TOC Initial Note (Signed)
 Transition of Care [facility]) - Initial/Assessment Note    Patient Details  Name: Alexandria Wright MRN: [id number] Date of Birth: 1929/08/26  [facility] CM/SW Contact:    Isaiah Public, LCSWA Phone Number: 12/06/2023, 10:55 AM  Clinical Narrative:                  CSW received consult for possible SNF placement at time of discharge. Due to patients current orientation CSW spoke with patients niece Marval regarding PT recommendation of SNF placement at time of discharge. Patients niece expressed understanding of PT recommendation and would like to discuss with brother-n-law patients dc plan and then will follow up with CSW.  No further questions reported at this time. CSW to continue to follow and assist with discharge planning needs.   Expected Discharge Plan:  (TBD) Barriers to Discharge: Continued Medical Work up   Patient Goals and CMS Choice            Expected Discharge Plan and Services In-house Referral: Clinical Social Work     Living arrangements for the past 2 months: Single Family Home                                      Prior Living Arrangements/Services Living arrangements for the past 2 months: Single Family Home Lives with:: Self Patient language and need for interpreter reviewed:: Yes        Need for Family Participation in Patient Care: Yes (Comment) Care giver support system in place?: Yes (comment)   Criminal Activity/Legal Involvement Pertinent to Current Situation/Hospitalization: No - Comment as needed  Activities of Daily Living   ADL Screening (condition at time of admission) Is the patient deaf or have difficulty hearing?: Yes Does the patient have difficulty seeing, even when wearing glasses/contacts?: No Does the patient have difficulty concentrating, remembering, or making decisions?: No  Permission Sought/Granted                  Emotional Assessment   Attitude/Demeanor/Rapport: Unable to Assess Affect  (typically observed): Unable to Assess Orientation: : Oriented to Self Alcohol  / Substance Use: Not Applicable Psych Involvement: No (comment)  Admission diagnosis:  Closed displaced intertrochanteric fracture of right femur, initial encounter (HCC) [S72.141A] Closed displaced fracture of right femoral neck (HCC) [S72.001A] Patient Active Problem List   Diagnosis Date Noted   Protein-calorie malnutrition, severe 12/01/2023   Closed displaced intertrochanteric fracture of right femur, initial encounter (HCC) 11/30/2023   Bilateral impacted cerumen 09/28/2023   Pure hypercholesterolemia 09/28/2023   History of CVA (cerebrovascular accident) 09/28/2023   Stroke (cerebrum) (HCC) 06/02/2023   Aphasia 06/01/2023   Underweight 09/05/2016   S/P placement of cardiac pacemaker 08/25/2014   Midline low back pain without sciatica 08/25/2014   Hyperkalemia 08/25/2014   Essential hypertension, benign 08/22/2013   Loss of weight 08/22/2013   Facial asymmetry 09/10/2012   Pacemaker-Mdt 07/08/2011   GERD 04/24/2009   Senile osteoporosis 04/24/2009   DYSPHAGIA UNSPECIFIED 04/24/2009   Tachy-brady syndrome (HCC) 06/02/2008   Esophageal stricture 06/02/2008   SYNCOPE AND COLLAPSE 06/02/2008   PCP:  Caro Harlene POUR, NP Pharmacy:   [facility] [phone] - [place], [place] - [address] AT [facility] [address] [place] [address] Phone: [phone] Fax: [phone]     Social Drivers of Health (SDOH) Social History: SDOH Screenings   Food Insecurity: No  Food Insecurity (12/01/2023)  Housing: Low Risk  (12/01/2023)  Transportation Needs: No Transportation Needs (12/01/2023)  Utilities: Patient Declined (12/01/2023)  Depression (PHQ2-9): Low Risk  (11/17/2022)  Financial Resource Strain: Low Risk  (09/21/2017)  Physical Activity: Sufficiently Active (09/21/2017)  Social Connections: Moderately Isolated (12/01/2023)  Stress: No Stress Concern Present  (09/21/2017)  Tobacco Use: Low Risk  (12/01/2023)   SDOH Interventions:     Readmission Risk Interventions     No data to display

## 2023-12-07 LAB — CULTURE, BLOOD (ROUTINE X 2)
Culture: NO GROWTH
Culture: NO GROWTH

## 2023-12-11 NOTE — Death Summary Note (Signed)
 DEATH SUMMARY   Patient Details  Name: Alexandria Wright MRN: [id number] DOB: [birth year] ERE:Zlajwxd, Harlene POUR, NP Admission/Discharge Information   Admit Date:  12-30-23  Date of Death: Date of Death: 01/05/24  Time of Death: Time of Death: 01/13/2204  Length of Stay: 7   Principle Cause of death: acute hypoxic failure due to aspiration pneumonia.  Hospital Diagnoses: Principal Problem:   Closed displaced intertrochanteric fracture of right femur, initial encounter [facility]) Active Problems:   Tachy-brady syndrome (HCC)   History of CVA (cerebrovascular accident)   Protein-calorie malnutrition, severe  Hospital Course: Alexandria Wright is a 89 y.o. female with medical history significant for history of CVA, tachy-brady syndrome s/p PPM who is admitted with right hip fracture. 7/22 underwent ORIF with intramedullary implant.  7/23 significant leukocytosis, chest x-ray for shortness of breath Negative for pneumonia. 7/24 SLP evaluation, recommended regular thin liquid diet.  Hemoglobin dropped.  Below 8.  Received 1 PRBC transfusion.  Was delirious, saying I want to die. 7/25 transferred to progressive care due to worsening respiratory status.  CT PE protocol negative for PE concerning for possible pneumonia.  Procalcitonin trending up.  Now on Venturi mask. 7/26 required mittens in the morning, mentation improved as the day progressed and pain got controlled.  Enema ordered.  Goals of care conversation with the family. 7/17 mentation worsened after the patient received opioids for uncontrolled pain.  AKI with hyperkalemia.  Transitioned to comfort care based on the discussion with the family.  Initiated on a Dilaudid  drip for comfort.  Assessment and Plan: Closed displaced intertrochanteric fracture of right femur, initial encounter After mechanical fall Orthopedic surgery was consulted. Underwent intramedullary implant on 7/22.  With Dr.Xu Pain control was difficult postoperatively  given her difficulty swallowing, need for oxygen as well as intermittent confusion. Used tylenol  IV, toradol  and PRN morphine  and oxy ir.  Was comfort care.   Aspiration pneumonia  Chronic bronchiectasis  leukocytosis Large hiatal hernia Esophageal stricture. Initially leukocytosis was thought to be secondary to stress reaction in the setting of recent surgery and fracture. X-ray was performed on 7/23 which was negative for frank pneumonia. Given persistent leukocytosis and delirium patient was started on IV antibiotics.  Ceftriaxone  and Flagyl  based on her penicillin allergy . Blood cultures so far negative. CT PE protocol negative for PE. Pulmonary consulted due to worsening respiratory status. There was concern for fat embolism although less likely and most likely this is pneumonia progression. Supportive treatment was provided.  SLP evaluate the patient.  Recommended regular diet on 7/24 given history of desire to maintain thin liquids.  Was comfort care.  Acute postop blood loss anemia. Chronic iron deficiency. Expected drop in the hemoglobin postoperatively. Baseline hemoglobin around 14.  Hemoglobin was 12 on admission.  Dropped down to 7.7 on 7/24. Status post 1 PRBC transfusion. Hemoglobin remaining stable after that. Iron level 10.  Folate 17.  B12 427.  Not a good candidate for IV iron therapy in the setting of infection. Etiology of the iron deficiency is most likely from poor p.o. intake given her cachectic state.  AKI with hyperkalemia. Serum creatinine trended up to 1.13. Then improved.  On 01-05-2024 serum creatinine trended up again.  With hyperkalemia potassium 5.7. BUN 81. Mentation worsened as well. Initiated on IV fluid earlier. Later on stopped as transitioned to now comfort care.    History of CVA: Was on aspirin  and statin. Was comfort care.   Sinus tachycardia S/P pacemaker implant for tachybradycardia syndrome.  Currently with sinus tachycardia in the setting  of respiratory distress.  Treated with as needed IV Lopressor . Was comfort care.  Delirium. Likely postop as well as hospital induced.  Adult failure to thrive with underweight Body mass index is 12.03 kg/m.  Severe protein calorie malnutrition. Magic cup, Ensure Enlive (each supplement provides 350kcal and 20 grams of protein), Liberalize Diet, [facility] Dietitian was consulted. Placing the patient had a poor outcome. Was comfort care.  Stage I sacral ulcer present on admission  Goals of care conversation. Patient with progressively worsening pneumonia secondary to aspiration.  Oxygen requirements high at 9 L. Remaining tachycardic tachypneic and with worsening mentation, AKI with serum creatinine 1.88 and BUN 81 and potassium of 5.7. With ongoing respiratory distress. Patient with clear desire to remain DNR/DNI and avoid a feeding tube based on her goals of care documentation. Discussed with multiple family members. Transitioned to comfort care. Given her respiratory distress recommended low-dose IV Dilaudid  0.2 mg rate infusion. Family currently agreed. Was on as needed IV Dilaudid , IV Ativan , IV Haldol  and IV Robinul . Add scopolamine  patch. Unrestricted visitation. Anticipated hospital death.  Procedures: Open treatment of intertrochanteric, pertrochanteric, subtrochanteric fracture with intramedullary implant  with Dr Jerri 7/22  Consultations: Orthopedics  PCCM  Palliative care   The results of significant diagnostics from this hospitalization (including imaging, microbiology, ancillary and laboratory) are listed below for reference.   Significant Diagnostic Studies: DG Abd Portable 1V Result Date: 12/05/2023 CLINICAL DATA:  Abdominal pain. EXAM: PORTABLE ABDOMEN - 1 VIEW COMPARISON:  None Available. FINDINGS: No evidence of dilated bowel loops. Contrast noted in urinary bladder. Moderate stool burden noted. Severe lumbar spine degenerative changes and levoscoliosis noted. Aortic  atherosclerotic calcification incidentally noted. IMPRESSION: Nonobstructive bowel gas pattern.  Moderate stool burden noted. Electronically Signed   By: Norleen DELENA Kil M.D.   On: 12/05/2023 14:30   CT Angio Chest Pulmonary Embolism (PE) W or WO Contrast Addendum Date: 12/04/2023 ADDENDUM REPORT: 12/04/2023 17:37 ADDENDUM: Critical Value/emergent results were called by telephone at the time of interpretation by Dr Megan Zare on Friday December 04, 2023 at 22:31 p.m. to provider Dr. Tobie who verbally acknowledged these results. Electronically Signed   By: Megan  Zare M.D.   On: 12/04/2023 17:37   Result Date: 12/04/2023 CLINICAL DATA:  Pulmonary embolus, high probability., status post intertrochanteric fracture of the femur intramedullary rod fixation device December 01, 2023 the lead. EXAM: CT ANGIOGRAPHY CHEST WITH CONTRAST TECHNIQUE: Multidetector CT imaging of the chest was performed using the standard protocol during bolus administration of intravenous contrast. Multiplanar CT image reconstructions and MIPs were obtained to evaluate the vascular anatomy. RADIATION DOSE REDUCTION: This exam was performed according to the departmental dose-optimization program which includes automated exposure control, adjustment of the mA and/or kV according to patient size and/or use of iterative reconstruction technique. CONTRAST:  75mL OMNIPAQUE  IOHEXOL  350 MG/ML SOLN COMPARISON:  Same day chest x-ray multiple priors dated back to November 20 2006. FINDINGS: Cardiovascular: Satisfactory opacification of the pulmonary arteries to the segmental level. No evidence of pulmonary embolism. Cardiomegaly. Dual lead pacer in place. No pericardial effusion. Atherosclerotic calcifications of coronary arteries and aorta. Mediastinum/Nodes: Large-sized hiatal hernia measuring approximately 5.5 cm containing gastric fundus. Prominent bilateral lower lymphadenopathy measuring up to 1.6 cm. Patulous esophagus. Lungs/Pleura: Multifocal bronchiectasis  and bronchiolectasis with foci of mucostasis throughout entire lungs. There are innumerable bronchocentric ground-glass opacities and conglomerates of low-density solid pulmonary nodules with ground-glass halos more confluent in the right lower lobe periphery  appearing as a consolidation. Many of these nodules and intraluminal secretions appear calcified for example in right middle lobe (9/315, 301) and left lower lobe 9/278 consistent with chronic ongoing process. Remainder of the nodules are ground-glass and correlate to increasing opacities on recent postoperative chest radiographs. These nodules are new to 2008. No pleural effusion. Upper Abdomen: Unremarkable Musculoskeletal: Severe S shaped scoliosis and degenerative changes of the spine. Review of the MIP images confirms the above findings. IMPRESSION: Innumerable multifocal bronchocentric ground-glass and solid bronchocentric nodules with ground-glass halos. Background lower lobe predominant bronchiectasis and bronchiolectasis with intraluminal calcified and noncalcified secretions. Right lung base conglomerate nodules appearing as subpleural consolidations the. Constellation of findings can be seen the setting of infectious/inflammatory process on a background of bronchiectasis however in this patient with recent history of osseous fracture and surgical fixation, a component of superimposing fat embolism can not be completely excluded. CT findings in fat embolism may appear as ground-glass opacities (no intraluminal filling defect identified within the pulmonary arteries which is a rare finding described in fat embolism). Correlate with clinical findings and follow-up to ensure resolution. Atherosclerotic calcifications of aorta and coronary arteries. Cardiomegaly. Large hiatal hernia and patulous esophagus. Lower paratracheal mediastinal lymphadenopathy, likely reactive. Electronically Signed: By: Megan  Zare M.D. On: 12/04/2023 17:15   DG CHEST PORT 1  VIEW Result Date: 12/04/2023 CLINICAL DATA:  Shortness of breath. EXAM: PORTABLE CHEST 1 VIEW COMPARISON:  Chest x-ray 12/02/2023 FINDINGS: Hyperinflation. Apical pleural thickening. There is blunting of the left costophrenic angle as well, tiny effusion versus pleural thickening. Normal cardiopericardial silhouette with calcified aorta. Left upper chest pacemaker with leads along the right side of the heart. There are diffuse reticular and nodular changes seen of the lungs with more focal areas of nodularity seen left lung base. Some presumed areas of pleural thickening as well along the upper thorax bilaterally. Please correlate for additional history and if needed additional workup with chest CT when appropriate to evaluate these nodular areas. Breasts of process is possible. No frank consolidation. Curvature of the spine with osteopenia and degenerative changes. Film is rotated to the right. IMPRESSION: No significant interval change. Again hyperinflation with reticular and significant nodular areas. Aggressive process is possible. Please correlate with history. Pacemaker. Electronically Signed   By: Ranell Bring M.D.   On: 12/04/2023 10:59   DG CHEST PORT 1 VIEW Result Date: 12/02/2023 CLINICAL DATA:  Shortness of breath EXAM: PORTABLE CHEST 1 VIEW COMPARISON:  Chest x-ray performed June 02, 2023 FINDINGS: Heart mediastinum are not significantly changed. Left-sided chest wall implant with 2 leads terminating in the heart. Improved aeration of the left lung base. Chronic appearing interstitial airspace opacities. No significant pleural effusion or pneumothorax. IMPRESSION: 1. Improved aeration in the left lung base. 2. Underlying chronic changes, detailed above. Electronically Signed   By: Maude Naegeli M.D.   On: 12/02/2023 11:59   DG FEMUR, MIN 2 VIEWS RIGHT Result Date: 12/01/2023 CLINICAL DATA:  Elective surgery.  Right femoral neck fracture. EXAM: RIGHT FEMUR 2 VIEWS COMPARISON:  Right hip  radiograph dated 11/30/2023. FINDINGS: Six intraoperative fluoroscopic spot images provided. The total fluoroscopic time is 106.6 seconds with a cumulative air kerma of 6.01 mGy. Status post ORIF of right femoral neck fracture. IMPRESSION: Intraoperative fluoroscopic guidance for ORIF of right femoral neck fracture. Electronically Signed   By: Vanetta Chou M.D.   On: 12/01/2023 17:21   DG C-Arm 1-60 Min-No Report Result Date: 12/01/2023 Fluoroscopy was utilized by the requesting physician.  No radiographic interpretation.   DG C-Arm 1-60 Min-No Report Result Date: 12/01/2023 Fluoroscopy was utilized by the requesting physician.  No radiographic interpretation.   DG HIP UNILAT WITH PELVIS 2-3 VIEWS RIGHT Result Date: 11/30/2023 CLINICAL DATA:  Fall and right hip pain. EXAM: DG HIP (WITH OR WITHOUT PELVIS) 2-3V RIGHT COMPARISON:  None Available. FINDINGS: There is a comminuted, angulated, and displaced fracture of the right femoral neck. No dislocation. The bones are osteopenic. The soft tissues are unremarkable. IMPRESSION: Comminuted, angulated, and displaced fracture of the right femoral neck. Electronically Signed   By: Vanetta Chou M.D.   On: 11/30/2023 17:05    Microbiology: Recent Results (from the past 240 hours)  Surgical pcr screen     Status: None   Collection Time: 12/01/23  5:44 AM   Specimen: Nasal Mucosa; Nasal Swab  Result Value Ref Range Status   MRSA, PCR NEGATIVE NEGATIVE Final   Staphylococcus aureus NEGATIVE NEGATIVE Final    Comment: (NOTE) The Xpert SA Assay (FDA approved for NASAL specimens in patients 85 years of age and older), is one component of a comprehensive surveillance program. It is not intended to diagnose infection nor to guide or monitor treatment. Performed at [facility], [address] [place], [place] [address]   Culture, blood (Routine X 2) w Reflex to ID Panel     Status: None (Preliminary result)   Collection Time: 12/02/23  5:40  PM   Specimen: BLOOD RIGHT HAND  Result Value Ref Range Status   Specimen Description BLOOD RIGHT HAND  Final   Special Requests   Final    BOTTLES DRAWN AEROBIC ONLY Blood Culture results may not be optimal due to an inadequate volume of blood received in culture bottles   Culture   Final    NO GROWTH 4 DAYS Performed at [facility], [address] [place], [place] [address]    Report Status PENDING  Incomplete  Culture, blood (Routine X 2) w Reflex to ID Panel     Status: None (Preliminary result)   Collection Time: 12/02/23  5:56 PM   Specimen: BLOOD LEFT HAND  Result Value Ref Range Status   Specimen Description BLOOD LEFT HAND  Final   Special Requests   Final    BOTTLES DRAWN AEROBIC ONLY Blood Culture results may not be optimal due to an inadequate volume of blood received in culture bottles   Culture   Final    NO GROWTH 4 DAYS Performed at [facility], [address], [place], [place] [address]    Report Status PENDING  Incomplete   Time spent: 30 minutes  Signed: Yetta Blanch, MD 12-12-2023

## 2023-12-11 NOTE — Plan of Care (Signed)
  Problem: Pain Management: Goal: Pain level will decrease Outcome: Progressing   

## 2023-12-11 DEATH — deceased

## 2023-12-21 ENCOUNTER — Ambulatory Visit: Payer: Medicare PPO

## 2023-12-28 ENCOUNTER — Other Ambulatory Visit

## 2024-01-04 ENCOUNTER — Ambulatory Visit: Admitting: Nurse Practitioner

## 2024-01-24 ENCOUNTER — Other Ambulatory Visit: Payer: Self-pay | Admitting: Nurse Practitioner

## 2024-01-24 DIAGNOSIS — Z8673 Personal history of transient ischemic attack (TIA), and cerebral infarction without residual deficits: Secondary | ICD-10-CM

## 2024-03-21 ENCOUNTER — Ambulatory Visit: Payer: Medicare PPO

## 2024-06-20 ENCOUNTER — Ambulatory Visit: Payer: Medicare PPO

## 2024-09-19 ENCOUNTER — Ambulatory Visit

## 2024-12-19 ENCOUNTER — Ambulatory Visit

## 2025-03-20 ENCOUNTER — Ambulatory Visit

## 2025-06-19 ENCOUNTER — Ambulatory Visit

## 2025-09-18 ENCOUNTER — Ambulatory Visit

## 2025-12-18 ENCOUNTER — Ambulatory Visit
